# Patient Record
Sex: Male | Born: 1935 | Race: Black or African American | Hispanic: No | Marital: Married | Smoking: Former smoker
Health system: Southern US, Community
[De-identification: ages and names within clinical notes are randomized; demographics above are authoritative.]

## PROBLEM LIST (undated history)

## (undated) DIAGNOSIS — M1990 Unspecified osteoarthritis, unspecified site: Secondary | ICD-10-CM

## (undated) DIAGNOSIS — Z9289 Personal history of other medical treatment: Secondary | ICD-10-CM

## (undated) DIAGNOSIS — G8929 Other chronic pain: Secondary | ICD-10-CM

## (undated) DIAGNOSIS — D490 Neoplasm of unspecified behavior of digestive system: Secondary | ICD-10-CM

## (undated) DIAGNOSIS — M549 Dorsalgia, unspecified: Secondary | ICD-10-CM

## (undated) DIAGNOSIS — R9431 Abnormal electrocardiogram [ECG] [EKG]: Secondary | ICD-10-CM

## (undated) DIAGNOSIS — C61 Malignant neoplasm of prostate: Secondary | ICD-10-CM

## (undated) DIAGNOSIS — I10 Essential (primary) hypertension: Secondary | ICD-10-CM

## (undated) DIAGNOSIS — R51 Headache: Secondary | ICD-10-CM

## (undated) DIAGNOSIS — E785 Hyperlipidemia, unspecified: Secondary | ICD-10-CM

## (undated) DIAGNOSIS — I739 Peripheral vascular disease, unspecified: Secondary | ICD-10-CM

## (undated) DIAGNOSIS — I2510 Atherosclerotic heart disease of native coronary artery without angina pectoris: Secondary | ICD-10-CM

## (undated) DIAGNOSIS — K219 Gastro-esophageal reflux disease without esophagitis: Secondary | ICD-10-CM

## (undated) DIAGNOSIS — R519 Headache, unspecified: Secondary | ICD-10-CM

## (undated) DIAGNOSIS — I219 Acute myocardial infarction, unspecified: Secondary | ICD-10-CM

## (undated) DIAGNOSIS — N390 Urinary tract infection, site not specified: Secondary | ICD-10-CM

## (undated) HISTORY — PX: PROSTATECTOMY: SHX69

## (undated) HISTORY — DX: Hyperlipidemia, unspecified: E78.5

## (undated) HISTORY — DX: Peripheral vascular disease, unspecified: I73.9

## (undated) HISTORY — DX: Malignant neoplasm of prostate: C61

## (undated) HISTORY — DX: Personal history of other medical treatment: Z92.89

## (undated) HISTORY — PX: CATARACT EXTRACTION W/ INTRAOCULAR LENS  IMPLANT, BILATERAL: SHX1307

## (undated) HISTORY — PX: FRACTURE SURGERY: SHX138

## (undated) HISTORY — PX: KNEE DISLOCATION SURGERY: SHX689

## (undated) HISTORY — DX: Essential (primary) hypertension: I10

## (undated) HISTORY — DX: Atherosclerotic heart disease of native coronary artery without angina pectoris: I25.10

---
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
## 1998-02-03 ENCOUNTER — Emergency Department (HOSPITAL_COMMUNITY): Admission: EM | Admit: 1998-02-03 | Discharge: 1998-02-03 | Payer: Self-pay | Admitting: Internal Medicine

## 2001-05-09 ENCOUNTER — Encounter: Payer: Self-pay | Admitting: Internal Medicine

## 2001-05-09 ENCOUNTER — Ambulatory Visit (HOSPITAL_COMMUNITY): Admission: RE | Admit: 2001-05-09 | Discharge: 2001-05-09 | Payer: Self-pay | Admitting: Internal Medicine

## 2001-05-24 ENCOUNTER — Encounter (INDEPENDENT_AMBULATORY_CARE_PROVIDER_SITE_OTHER): Payer: Self-pay | Admitting: *Deleted

## 2001-05-24 ENCOUNTER — Other Ambulatory Visit: Admission: RE | Admit: 2001-05-24 | Discharge: 2001-05-24 | Payer: Self-pay | Admitting: Urology

## 2001-07-08 ENCOUNTER — Inpatient Hospital Stay (HOSPITAL_COMMUNITY): Admission: RE | Admit: 2001-07-08 | Discharge: 2001-07-10 | Payer: Self-pay | Admitting: Urology

## 2001-07-08 ENCOUNTER — Encounter (INDEPENDENT_AMBULATORY_CARE_PROVIDER_SITE_OTHER): Payer: Self-pay

## 2004-02-24 ENCOUNTER — Encounter: Admission: RE | Admit: 2004-02-24 | Discharge: 2004-02-24 | Payer: Self-pay | Admitting: Internal Medicine

## 2004-03-24 ENCOUNTER — Emergency Department (HOSPITAL_COMMUNITY): Admission: EM | Admit: 2004-03-24 | Discharge: 2004-03-25 | Payer: Self-pay | Admitting: Emergency Medicine

## 2004-03-25 ENCOUNTER — Ambulatory Visit (HOSPITAL_COMMUNITY): Admission: RE | Admit: 2004-03-25 | Discharge: 2004-03-25 | Payer: Self-pay | Admitting: Gastroenterology

## 2007-08-15 DIAGNOSIS — I219 Acute myocardial infarction, unspecified: Secondary | ICD-10-CM

## 2007-08-15 HISTORY — DX: Acute myocardial infarction, unspecified: I21.9

## 2007-09-09 ENCOUNTER — Encounter: Admission: RE | Admit: 2007-09-09 | Discharge: 2007-09-09 | Payer: Self-pay | Admitting: Orthopaedic Surgery

## 2008-03-18 ENCOUNTER — Encounter: Admission: RE | Admit: 2008-03-18 | Discharge: 2008-03-18 | Payer: Self-pay | Admitting: Cardiology

## 2008-03-20 HISTORY — PX: CARDIAC CATHETERIZATION: SHX172

## 2008-04-28 ENCOUNTER — Ambulatory Visit: Payer: Self-pay | Admitting: Cardiothoracic Surgery

## 2008-05-01 ENCOUNTER — Ambulatory Visit: Payer: Self-pay | Admitting: Internal Medicine

## 2008-05-01 ENCOUNTER — Encounter: Payer: Self-pay | Admitting: Cardiothoracic Surgery

## 2008-05-01 ENCOUNTER — Ambulatory Visit (HOSPITAL_COMMUNITY): Admission: RE | Admit: 2008-05-01 | Discharge: 2008-05-01 | Payer: Self-pay | Admitting: Cardiothoracic Surgery

## 2008-05-06 ENCOUNTER — Inpatient Hospital Stay (HOSPITAL_COMMUNITY): Admission: RE | Admit: 2008-05-06 | Discharge: 2008-05-11 | Payer: Self-pay | Admitting: Cardiothoracic Surgery

## 2008-05-06 ENCOUNTER — Encounter: Payer: Self-pay | Admitting: Cardiothoracic Surgery

## 2008-05-06 ENCOUNTER — Ambulatory Visit: Payer: Self-pay | Admitting: Cardiothoracic Surgery

## 2008-05-06 HISTORY — PX: CORONARY ARTERY BYPASS GRAFT: SHX141

## 2008-06-05 ENCOUNTER — Ambulatory Visit: Payer: Self-pay | Admitting: Cardiothoracic Surgery

## 2008-06-05 ENCOUNTER — Encounter: Admission: RE | Admit: 2008-06-05 | Discharge: 2008-06-05 | Payer: Self-pay | Admitting: Cardiothoracic Surgery

## 2008-06-11 ENCOUNTER — Encounter (HOSPITAL_COMMUNITY): Admission: RE | Admit: 2008-06-11 | Discharge: 2008-08-12 | Payer: Self-pay | Admitting: Cardiology

## 2008-07-08 DIAGNOSIS — Z9289 Personal history of other medical treatment: Secondary | ICD-10-CM

## 2008-07-08 HISTORY — DX: Personal history of other medical treatment: Z92.89

## 2008-07-28 ENCOUNTER — Ambulatory Visit (HOSPITAL_COMMUNITY): Admission: RE | Admit: 2008-07-28 | Discharge: 2008-07-28 | Payer: Self-pay | Admitting: Cardiology

## 2008-07-28 HISTORY — PX: ABDOMINAL ANGIOGRAM: SHX5705

## 2008-08-14 ENCOUNTER — Encounter (HOSPITAL_COMMUNITY): Admission: RE | Admit: 2008-08-14 | Discharge: 2008-09-17 | Payer: Self-pay | Admitting: Cardiology

## 2008-09-23 ENCOUNTER — Encounter (HOSPITAL_COMMUNITY): Admission: RE | Admit: 2008-09-23 | Discharge: 2008-12-22 | Payer: Self-pay | Admitting: Cardiology

## 2008-10-13 ENCOUNTER — Ambulatory Visit (HOSPITAL_COMMUNITY): Admission: RE | Admit: 2008-10-13 | Discharge: 2008-10-13 | Payer: Self-pay | Admitting: Cardiology

## 2008-10-13 HISTORY — PX: ABDOMINAL ANGIOGRAM: SHX5705

## 2009-10-02 ENCOUNTER — Inpatient Hospital Stay (HOSPITAL_COMMUNITY): Admission: EM | Admit: 2009-10-02 | Discharge: 2009-10-03 | Payer: Self-pay | Admitting: Emergency Medicine

## 2010-07-22 IMAGING — CR DG CHEST 2V
2 series · 2 of 2 positions shown · non-contrast
Comparison: 03/24/2004

CLINICAL DATA: Preoperative respiratory exam.  Chest pain.

CHEST - 2 VIEW

[w chest pa]
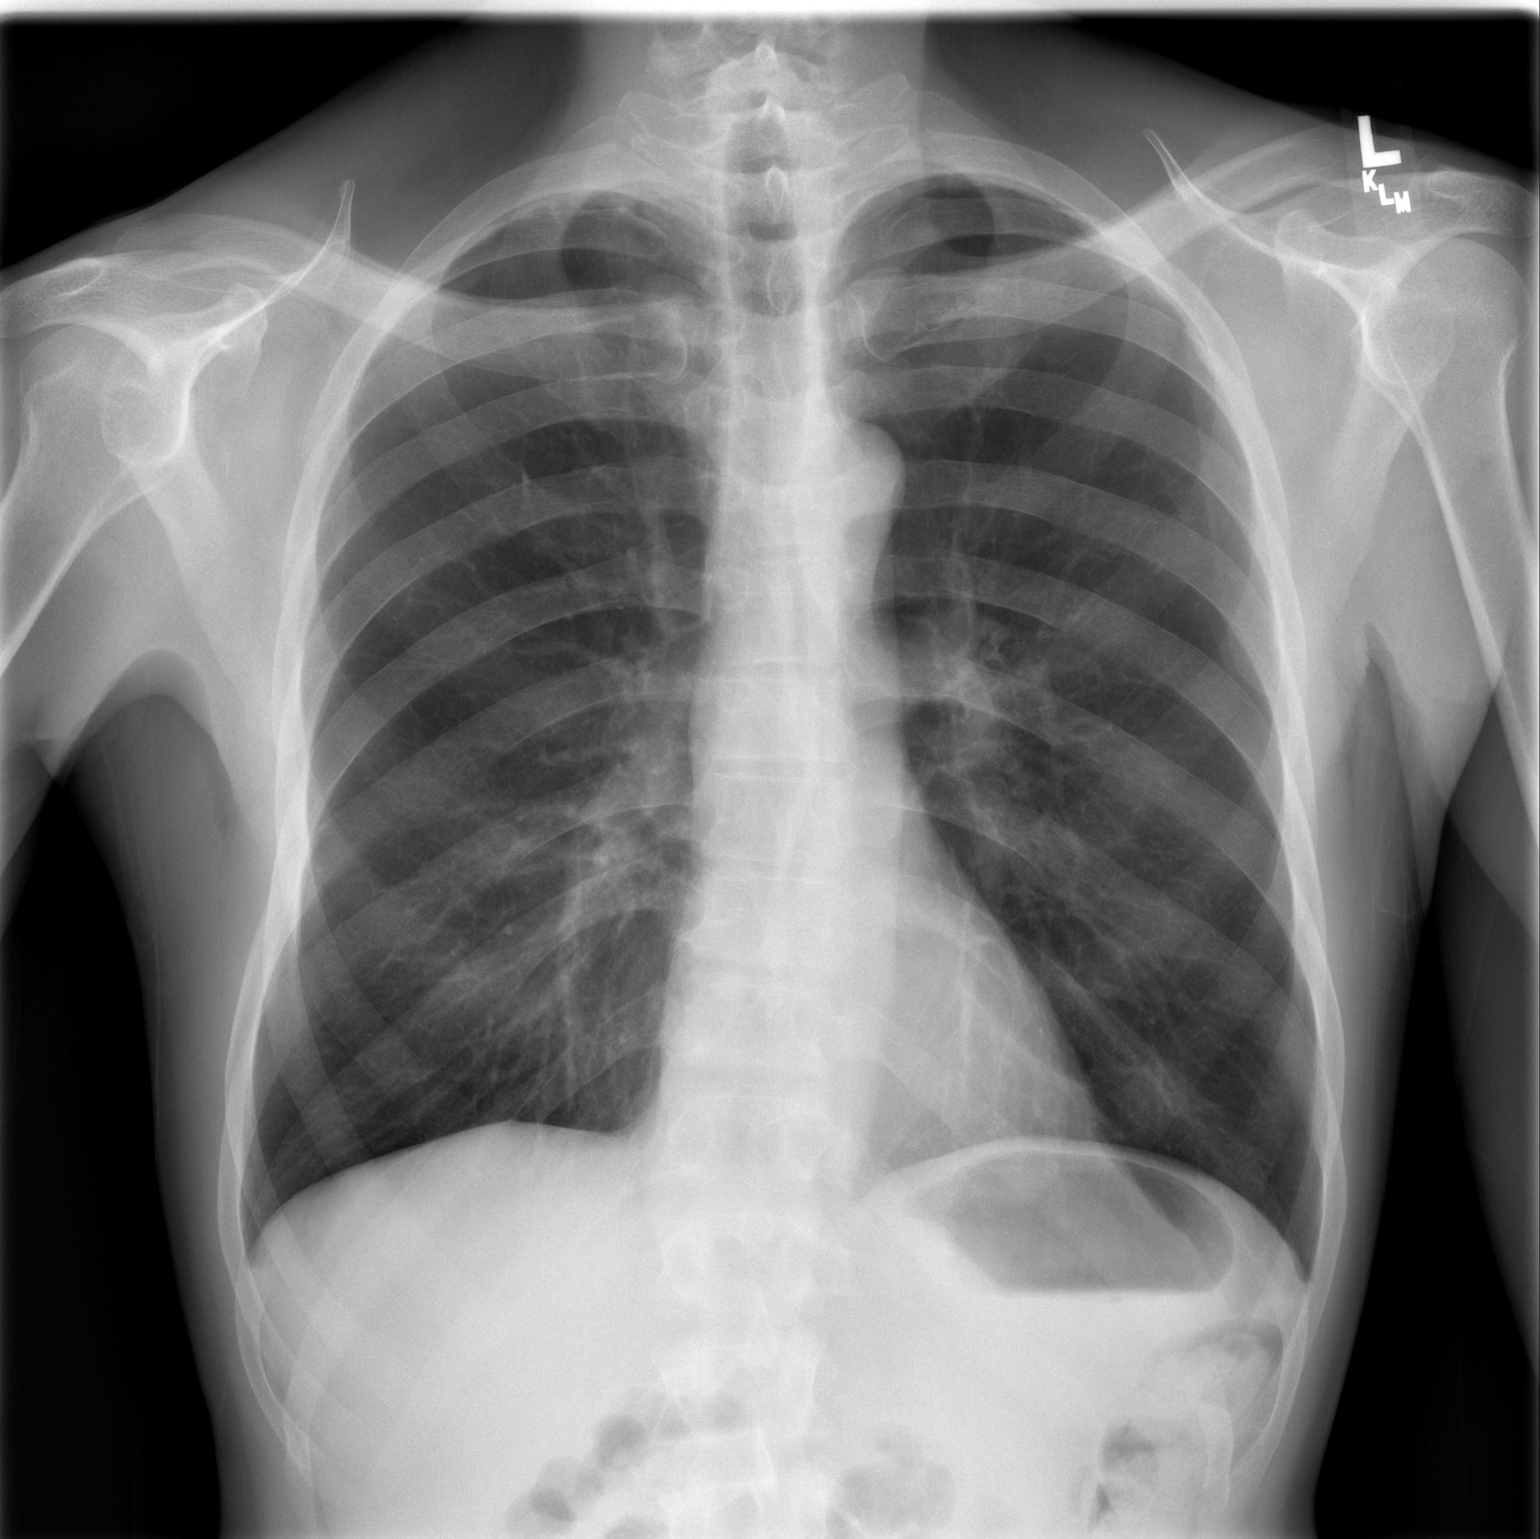

[w chest lat]
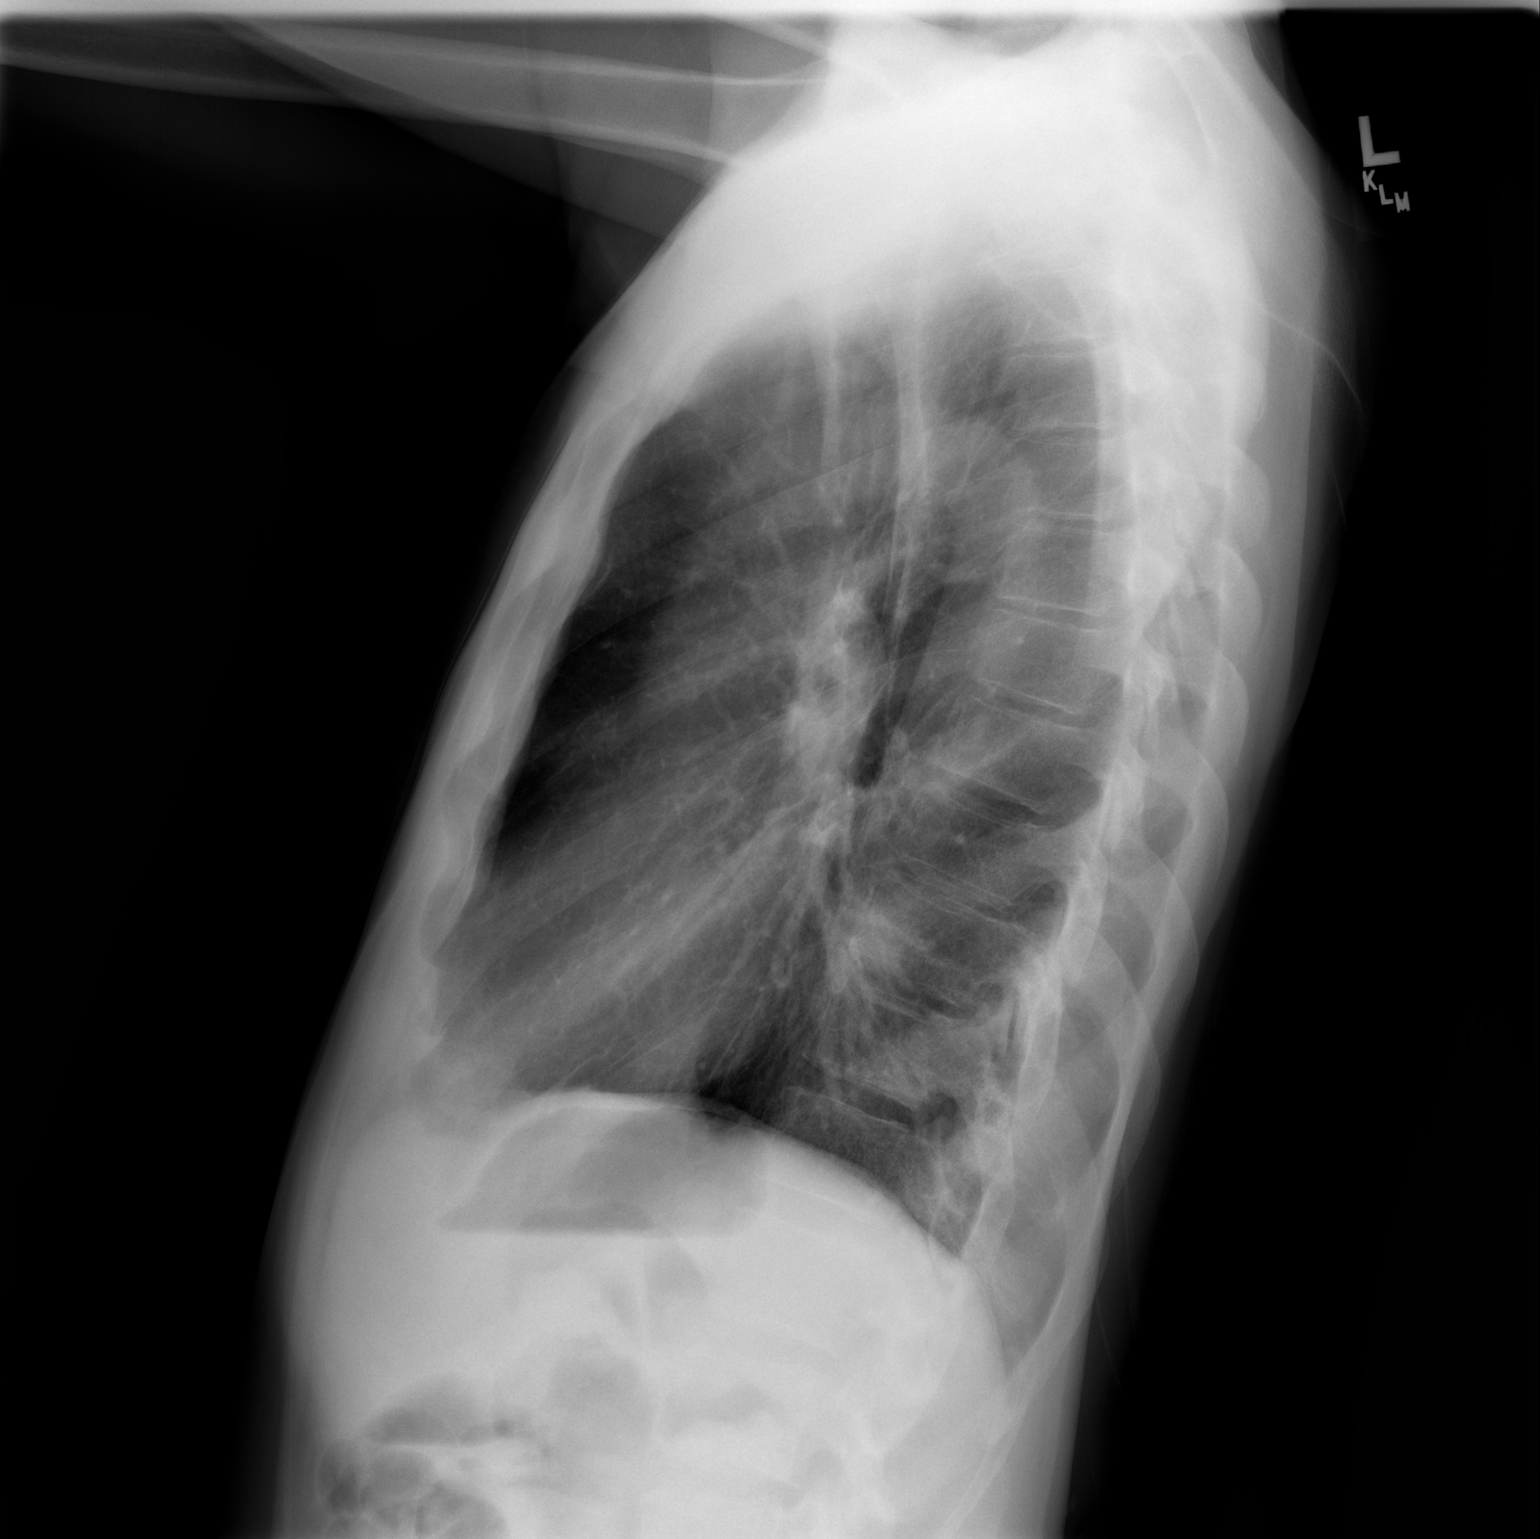

[2 of 2 positions shown; findings below may reference images not displayed]

FINDINGS: Heart size is normal.  The mediastinum is unremarkable.
Lungs are clear.  No effusions.  Bony structures unremarkable.
IMPRESSION: Normal chest

## 2010-11-02 LAB — CK TOTAL AND CKMB (NOT AT ARMC)
CK, MB: 0.9 ng/mL (ref 0.3–4.0)
Total CK: 82 U/L (ref 7–232)

## 2010-11-02 LAB — LIPID PANEL
Cholesterol: 140 mg/dL (ref 0–200)
HDL: 50 mg/dL (ref >39)
Total CHOL/HDL Ratio: 2.8 RATIO
VLDL: 10 mg/dL (ref 0–40)

## 2010-11-02 LAB — CBC
HCT: 41.8 % (ref 39.0–52.0)
HCT: 46.7 % (ref 39.0–52.0)
Hemoglobin: 14.3 g/dL (ref 13.0–17.0)
MCHC: 33.8 g/dL (ref 30.0–36.0)
MCHC: 34.3 g/dL (ref 30.0–36.0)
MCV: 91.4 fL (ref 78.0–100.0)
Platelets: 181 10*3/uL (ref 150–400)
Platelets: 185 10*3/uL (ref 150–400)
RDW: 13.6 % (ref 11.5–15.5)
WBC: 8.6 10*3/uL (ref 4.0–10.5)

## 2010-11-02 LAB — POCT I-STAT, CHEM 8
Calcium, Ion: 1.1 mmol/L — ABNORMAL LOW (ref 1.12–1.32)
Chloride: 103 mEq/L (ref 96–112)
Creatinine, Ser: 1.3 mg/dL (ref 0.4–1.5)
Glucose, Bld: 93 mg/dL (ref 70–99)
Potassium: 4.4 mEq/L (ref 3.5–5.1)

## 2010-11-02 LAB — HEMOGLOBIN A1C
Hgb A1c MFr Bld: 6.6 % — ABNORMAL HIGH (ref 4.6–6.1)
Mean Plasma Glucose: 143 mg/dL

## 2010-11-02 LAB — MAGNESIUM: Magnesium: 2.1 mg/dL (ref 1.5–2.5)

## 2010-11-02 LAB — BASIC METABOLIC PANEL
BUN: 15 mg/dL (ref 6–23)
Calcium: 8.5 mg/dL (ref 8.4–10.5)
GFR calc non Af Amer: 54 mL/min — ABNORMAL LOW (ref >60)
Glucose, Bld: 104 mg/dL — ABNORMAL HIGH (ref 70–99)
Sodium: 135 mEq/L (ref 135–145)

## 2010-11-02 LAB — POCT CARDIAC MARKERS
CKMB, poc: 1 ng/mL (ref 1.0–8.0)
Troponin i, poc: <0.05 ng/mL (ref 0.00–0.09)

## 2010-11-02 LAB — DIFFERENTIAL
Basophils Relative: 0 % (ref 0–1)
Eosinophils Absolute: 0 10*3/uL (ref 0.0–0.7)
Eosinophils Relative: 0 % (ref 0–5)
Lymphs Abs: 1 10*3/uL (ref 0.7–4.0)

## 2010-11-02 LAB — COMPREHENSIVE METABOLIC PANEL
Albumin: 3.4 g/dL — ABNORMAL LOW (ref 3.5–5.2)
BUN: 12 mg/dL (ref 6–23)
Calcium: 8.8 mg/dL (ref 8.4–10.5)
Creatinine, Ser: 1.28 mg/dL (ref 0.4–1.5)
Total Protein: 7.1 g/dL (ref 6.0–8.3)

## 2010-11-02 LAB — CARDIAC PANEL(CRET KIN+CKTOT+MB+TROPI)
Total CK: 85 U/L (ref 7–232)
Troponin I: 0.02 ng/mL (ref 0.00–0.06)

## 2010-11-02 LAB — TROPONIN I: Troponin I: 0.01 ng/mL (ref 0.00–0.06)

## 2010-11-02 LAB — PROTIME-INR: Prothrombin Time: 13.6 seconds (ref 11.6–15.2)

## 2010-11-02 LAB — TSH: TSH: 1.559 u[IU]/mL (ref 0.350–4.500)

## 2010-12-23 ENCOUNTER — Observation Stay (HOSPITAL_COMMUNITY)
Admission: EM | Admit: 2010-12-23 | Discharge: 2010-12-24 | Disposition: A | Discharge status: Home / Self Care | Payer: Medicare Other | Attending: Internal Medicine | Admitting: Internal Medicine

## 2010-12-23 ENCOUNTER — Emergency Department (HOSPITAL_COMMUNITY): Payer: Medicare Other

## 2010-12-23 DIAGNOSIS — Z7902 Long term (current) use of antithrombotics/antiplatelets: Secondary | ICD-10-CM | POA: Insufficient documentation

## 2010-12-23 DIAGNOSIS — E785 Hyperlipidemia, unspecified: Secondary | ICD-10-CM | POA: Insufficient documentation

## 2010-12-23 DIAGNOSIS — I252 Old myocardial infarction: Secondary | ICD-10-CM | POA: Insufficient documentation

## 2010-12-23 DIAGNOSIS — I2510 Atherosclerotic heart disease of native coronary artery without angina pectoris: Secondary | ICD-10-CM | POA: Insufficient documentation

## 2010-12-23 DIAGNOSIS — R000 Tachycardia, unspecified: Secondary | ICD-10-CM | POA: Insufficient documentation

## 2010-12-23 DIAGNOSIS — C61 Malignant neoplasm of prostate: Secondary | ICD-10-CM | POA: Insufficient documentation

## 2010-12-23 DIAGNOSIS — K5900 Constipation, unspecified: Secondary | ICD-10-CM | POA: Insufficient documentation

## 2010-12-23 DIAGNOSIS — K5641 Fecal impaction: Principal | ICD-10-CM | POA: Insufficient documentation

## 2010-12-23 DIAGNOSIS — Z79899 Other long term (current) drug therapy: Secondary | ICD-10-CM | POA: Insufficient documentation

## 2010-12-23 DIAGNOSIS — Z951 Presence of aortocoronary bypass graft: Secondary | ICD-10-CM | POA: Insufficient documentation

## 2010-12-23 DIAGNOSIS — I10 Essential (primary) hypertension: Secondary | ICD-10-CM | POA: Insufficient documentation

## 2010-12-23 DIAGNOSIS — Z7982 Long term (current) use of aspirin: Secondary | ICD-10-CM | POA: Insufficient documentation

## 2010-12-23 DIAGNOSIS — R9431 Abnormal electrocardiogram [ECG] [EKG]: Secondary | ICD-10-CM | POA: Insufficient documentation

## 2010-12-23 LAB — COMPREHENSIVE METABOLIC PANEL
ALT: 14 U/L (ref 0–53)
Albumin: 3.8 g/dL (ref 3.5–5.2)
Alkaline Phosphatase: 85 U/L (ref 39–117)
Calcium: 9.2 mg/dL (ref 8.4–10.5)
Potassium: 4.3 mEq/L (ref 3.5–5.1)
Sodium: 137 mEq/L (ref 135–145)
Total Protein: 7.6 g/dL (ref 6.0–8.3)

## 2010-12-23 LAB — URINALYSIS, ROUTINE W REFLEX MICROSCOPIC
Glucose, UA: NEGATIVE mg/dL
Ketones, ur: 15 mg/dL — AB
Leukocytes, UA: NEGATIVE
Protein, ur: NEGATIVE mg/dL
pH: 6.5 (ref 5.0–8.0)

## 2010-12-23 LAB — POCT I-STAT, CHEM 8
BUN: 13 mg/dL (ref 6–23)
Creatinine, Ser: 1.2 mg/dL (ref 0.4–1.5)
Glucose, Bld: 120 mg/dL — ABNORMAL HIGH (ref 70–99)
HCT: 50 % (ref 39.0–52.0)
Potassium: 4.3 mEq/L (ref 3.5–5.1)
Sodium: 138 mEq/L (ref 135–145)

## 2010-12-23 LAB — DIFFERENTIAL
Basophils Absolute: 0 10*3/uL (ref 0.0–0.1)
Basophils Relative: 0 % (ref 0–1)
Eosinophils Absolute: 0 10*3/uL (ref 0.0–0.7)
Eosinophils Relative: 0 % (ref 0–5)
Monocytes Absolute: 1.6 10*3/uL — ABNORMAL HIGH (ref 0.1–1.0)

## 2010-12-23 LAB — CBC
MCHC: 34.7 g/dL (ref 30.0–36.0)
Platelets: 214 10*3/uL (ref 150–400)
RDW: 13.3 % (ref 11.5–15.5)
WBC: 17.2 10*3/uL — ABNORMAL HIGH (ref 4.0–10.5)

## 2010-12-23 LAB — URINE MICROSCOPIC-ADD ON

## 2010-12-23 LAB — GLUCOSE, CAPILLARY: Glucose-Capillary: 98 mg/dL (ref 70–99)

## 2010-12-23 MED ORDER — IOHEXOL 300 MG/ML  SOLN
100.0000 mL | Freq: Once | INTRAMUSCULAR | Status: AC | PRN
  Administered 2010-12-23: 100 mL via INTRAVENOUS

## 2010-12-24 LAB — CBC
HCT: 40.1 % (ref 39.0–52.0)
MCH: 30.5 pg (ref 26.0–34.0)
MCHC: 34.2 g/dL (ref 30.0–36.0)
MCV: 89.3 fL (ref 78.0–100.0)
Platelets: 180 10*3/uL (ref 150–400)
RDW: 13.5 % (ref 11.5–15.5)
WBC: 14.4 10*3/uL — ABNORMAL HIGH (ref 4.0–10.5)

## 2010-12-24 LAB — CARDIAC PANEL(CRET KIN+CKTOT+MB+TROPI)
CK, MB: 2.6 ng/mL (ref 0.3–4.0)
Relative Index: 1.5 (ref 0.0–2.5)
Total CK: 174 U/L (ref 7–232)
Total CK: 159 U/L (ref 7–232)
Troponin I: <0.3 ng/mL (ref <0.30)
Troponin I: <0.3 ng/mL (ref <0.30)

## 2010-12-24 LAB — CK TOTAL AND CKMB (NOT AT ARMC)
CK, MB: 2.5 ng/mL (ref 0.3–4.0)
Total CK: 197 U/L (ref 7–232)

## 2010-12-24 LAB — COMPREHENSIVE METABOLIC PANEL
Albumin: 2.8 g/dL — ABNORMAL LOW (ref 3.5–5.2)
Alkaline Phosphatase: 67 U/L (ref 39–117)
BUN: 10 mg/dL (ref 6–23)
Calcium: 8.5 mg/dL (ref 8.4–10.5)
Creatinine, Ser: 0.94 mg/dL (ref 0.4–1.5)
Glucose, Bld: 90 mg/dL (ref 70–99)
Total Protein: 6 g/dL (ref 6.0–8.3)

## 2010-12-27 NOTE — Cardiovascular Report (Signed)
NAME:  Tony Curtis, Tony Curtis                ACCOUNT NO.:  [id number]   MEDICAL RECORD NO.:  [id number]          PATIENT TYPE:  AMB   LOCATION:  SDS                          FACILITY:  [facility]   PHYSICIAN:  Vonna Kotyk R. Jacinto Halim, MD       DATE OF BIRTH:  02/10/1936   DATE OF PROCEDURE:  07/28/2008  DATE OF DISCHARGE:                            CARDIAC CATHETERIZATION   PROCEDURES PERFORMED:  1. Abdominal aortogram.  2. Abdominal aortogram with bifemoral runoff.  3. Crossover from the left femoral artery into the right femoral      artery.  4. Right femoral arteriogram.  5. Percutaneous transluminal angioplasty and balloon angioplasty with      scoring balloon angioplasty with angioscope of the right distal      superficial femoral artery and right popliteal artery.   INDICATIONS:  [name] is a 75 year old gentleman with known  coronary artery disease status post CABG who was been complaining of  severe lifestyle-limiting claudication of his bilateral lower  extremities, especially the calf.  He had undergone lower extremity  arterial Dopplers, which were abnormal revealing high-grade stenosis of  the distal SFA and popliteal artery bilaterally.  His ABIs were in the  0.6 range on the right.  Given his symptomatic claudication, he was  brought to the peripheral angiography suite for evaluation of his  peripheral anatomy.   Abdominal aortogram revealed presence of 2 renal arteries one on either  side spread as mild tortuosity of abdominal aorta.  There was acute  angulation of the aortoiliac bifurcation, but widely patent.   The aortoiliac bifurcation was widely patent.  The iliac arteries are  widely patent bilaterally.   Right lower extremity.  Right lower extremity revealed the distal right  SFA to have a 99% stenoses.  The right popliteal artery right at the  knee joint also had a high-grade 95-99% stenoses.   Below the right knee, there was initially 3-vessel runoff followed by  complete occlusion of the anterior tibial artery at just above the  ankle, however, this artery reconstitutes by collaterals.   Left lower extremity.  The left SFA also similarly shows a high-grade  90% left distal SFA stenosis followed by a popliteal 90% stenoses.   Below the left knee, the posterior tibial artery is occluded above the  ankle, however, this again reconstitutes by means of collaterals at the  level of the foot.  Overall, there was 2-vessel runoff bilaterally with  reconstitution.   Overall, there was 2-vessel runoff bilaterally below the knee with  reconstitution of the anterior tibial artery on the right and posterior  tibial artery on the left.   INTERVENTION DATA:  Successful PTA and scoring balloon angioplasty of  the right distal SFA and right popliteal artery with reduction of  stenosis from 99% to 0%.  A 5.0- x 20-mm angioscope balloon was utilized  with excellent results.  There was minimal antegrade dissection plane  noted in the distal SFA, however, this was not flow limiting.   RECOMMENDATIONS:  The patient will be discharged home today.  Given high-  grade  stenosis of the left distal SFA and left popliteal artery, he  probably will benefit from revascularization of the same before complete  occlusion occurs.  He also is symptomatic, however, if his symptoms do  resolve with medical therapy then continued medical therapy with  observation is indicated.  Otherwise, we will bring him back on a an  elective basis for angioplasty of the left lower extremity.  The patient  will follow up with Dr. Ritta Slot in about 10 days to 2-3 weeks for  clinical evaluation.   A total of 110 mL of contrast was utilized for diagnostic and  interventional procedure.   TECHNIQUE OF THE PROCEDURE:  Under usual sterile precautions using a 5-  French left femoral artery access, a 5-French Omni flush catheter was  advanced into the abdominal aorta and abdominal aortogram  was performed.  Abdominal aortogram with bifemoral runoff was also performed.   Using heparin for anticoagulation and using the same catheter, I was  able to crossover from the left into the right lower extremity.  Then,  using a Wholey wire, I was able to cross and place crossover Terumo 7  French sheath into the right common femoral artery.  Right femoral  arteriogram was performed.  Then, using a stabilizer 0.014th of an inch  guidewire, I was able to cross the lesion with mild amount of  difficulty.  Then, using angioscope balloon, multiple balloon  angioplasties were performed anywhere from 6-10 atmospheric pressure  throughout the entire lesion length of the distal SFA and popliteal  artery.  Having performed this, 400 mcg of intra-arterial nitroglycerin  was administered and angiography was performed.  Excellent results were  noted.  The wire and the balloon was withdrawn.  Then, the Terumo sheath  was gently pulled back into the left common femoral artery.  The patient  tolerated the procedure.  There was no immediate complication noted.      Cristy Hilts. Jacinto Halim, MD  Electronically Signed     JRG/MEDQ  D:  07/28/2008  T:  07/28/2008  Job:  [id number]   cc:   Ritta Slot, MD  Merlene Laughter. [name], M.D.

## 2010-12-27 NOTE — Op Note (Signed)
NAME:  Tony Curtis, Tony Curtis NO.:  [id number]   MEDICAL RECORD NO.:  [id number]          PATIENT TYPE:  INP   LOCATION:  2304                         FACILITY:  [facility]   PHYSICIAN:  Kerin Perna, M.D.  DATE OF BIRTH:  [birth year]   DATE OF PROCEDURE:  05/06/2008  DATE OF DISCHARGE:                               OPERATIVE REPORT   OPERATION:  1. Coronary artery bypass grafting x3 (left internal mammary artery to      LAD, saphenous vein graft to diagonal, saphenous vein graft to the      ramus intermediate).  2. Endoscopic harvest of bilateral thigh, greater saphenous vein.   SURGEON:  Kerin Perna, MD   ASSISTANTS:  1. Salvatore Decent Dorris Fetch, MD  2. Coral Ceo, PA.   PREOPERATIVE DIAGNOSIS:  Severe 2-vessel coronary artery disease with  class III anginal equivalent of shortness of breath.   POSTOPERATIVE DIAGNOSIS:  Severe 2-vessel coronary artery disease with  class III anginal equivalent of shortness of breath.   ANESTHESIA:  General.   INDICATIONS:  The patient is a 75 year old ex-smoker, who presents with  exertional dyspnea and a positive stress test.  Dr. Donavan Burnet cardiac  cath showed a mild to moderate left main stenosis with total occlusion  of the LAD with reconstitution via collaterals and high-grade stenosis  of the proximal ramus intermediate.  He was felt to be a candidate for  multivessel coronary bypass surgery.  I examined the patient in the  office and reviewed the results of cardiac cath with the patient and his  family.  I discussed the indications and benefits of coronary artery  bypass surgery for treatment of coronary artery disease.  I reviewed the  alternatives to surgical therapy as well.  I discussed with him the  major issues of surgery including the location of the surgical incision,  use of general anesthesia, and cardiopulmonary bypass, the choice of  conduit to include internal mammary artery and endoscopically harvested  saphenous vein, and the expected hospital recovery.  I also reviewed  with him the risks to him of coronary artery bypass surgery including  the risks of MI, CVA, bleeding, infection, blood transfusion  requirement, and death.  After reviewing these issues, he demonstrated  the understanding and agreed to proceed with surgery under what I felt  was an informed consent.   OPERATIVE FINDINGS:  The saphenous vein was small bilaterally, and the  total amount of vein was harvested from both thighs leaving no usable  vein in the leg.  The LAD was chronically occluded about an adequate  target.  The ramus intermediate was intramyocardial about an adequate  target.  The mammary artery had adequate flow, which improved after  papaverine administration.  The patient has significant emphysema.  The  circumflex had no intrinsic disease and was a vessel limited mainly to  the AV groove with very small branches across the ventricular wall and  was not grafted.  The patient did not receive any blood transfusion  during the surgery.   PROCEDURE:  The patient was brought to the operating  room and placed  supine on the operating table.  General anesthesia was induced.  A  transesophageal 2-D echo probe was placed by the anesthesiologist, which  showed mild to moderate reduction in global LV function, but no  significant valvular insufficiency.  The chest, abdomen, and legs were  prepped with Betadine and draped as a sterile field.  A sternal incision  was made.  The saphenous vein was harvested endoscopically from both  legs.  The left internal mammary artery was harvested as a pedicle graft  from its origin at the subclavian vessels.  Heparin was administered and  the ACT was documented as being therapeutic.  The sternal retractor was  placed and the pericardium was opened and suspended.  Pursestring was  placed in the ascending aorta and right atrium and the patient was  cannulated for bypass.  After  the vein had been harvested and inspected  and found to be adequate, the patient was then placed on bypass with  mild to moderate hypothermia.  The coronaries were identified and  dissected for grafting.  Cardioplegic catheters were placed for both  antegrade and retrograde cold blood cardioplegia and the mammary artery  and vein grafts were prepared for the distal anastomoses.  The aortic  crossclamp was applied and 800 mL of cold blood cardioplegia was  delivered with good cardioplegic arrest and septal temperature dropped  to less than 15 degrees.  Cardioplegia was then delivered every 15-20  minutes while the crossclamp was applied.   The distal coronary anastomoses were performed.  The first distal  anastomosis was to the ramus branch of the left coronary.  This was a  1.6-mm vessel with proximal 80% stenosis.  Reverse saphenous vein was  sewn end-to-side with running 7-0 Prolene with good flow through the  graft.  The best portion of the vein was used for this distal  anastomosis.  The second distal anastomosis was to the diagonal branch  of LAD, which was 1.5-mm vessel and a proximal total occlusion.  A  reverse saphenous vein was smaller caliber was sewn end to side with  running 7-0 Prolene with good flow through the graft.  Cardioplegia was  redosed.  The third anastomosis was to the mid LAD, which was  intramyocardial and was a 1.75-mm vessel, the total proximal occlusion.  Left IMA pedicle was brought through an opening and the left lateral  pericardium was brought down onto the LAD and sewn end to side with a  running 8-0 Prolene.  There was good flow through the anastomosis with  warming of the septum after briefly releasing the pedicle bulldog on the  mammary artery.  The bulldog was reapplied and the pedicle was secured  with the epicardium.  Cardioplegia was redosed.   While the crossclamp was still in place, 2 proximal vein anastomoses  were placed on the ascending  aorta using a 4.0-mm punch running 7-0  Prolene.  Prior to tying down the final proximal anastomosis, air was  vented from the coronaries with a dose of retrograde warm blood  cardioplegia and the final proximal anastomosis was tied.  The  crossclamp was then removed and his heart resumed a spontaneous rhythm.  Cardioplegia catheter was removed and air was aspirated from the vein  grafts with a 27-gauge needle.  The grafts were opened and each had good  flow.  Hemostasis was documented at the proximal and distal sites.  The  patient was rewarmed and reperfused.  Temporary pacing wires were  applied.  Lungs re-expanded and the ventilator was resumed.  When the  patient reached 37 degrees, he was weaned from bypass without  difficulty.  TEE showed preservation of LV function.  Protamine was  administered without adverse reaction.  The cannula was removed.  Mediastinum was irrigated with warm irrigation.  The superior  pericardial fat was closed over the aorta.  Two mediastinal and a left  pleural chest tube were placed  brought out through separate incisions.  The sternum was closed with  interrupted steel wire.  The pectoralis fascia and the subcutaneous  layers were closed with running Vicryl.  The skin was closed with a  subcuticular and sterile dressing was applied.  Total bypass time was  108 minutes and the patient returned to the ICU in stable condition.      Kerin Perna, M.D.  Electronically Signed     PV/MEDQ  D:  05/06/2008  T:  05/07/2008  Job:  [id number]   cc:   Ritta Slot, MD  [facility]

## 2010-12-27 NOTE — Consult Note (Signed)
NEW PATIENT CONSULTATION   RENA, SWEEDEN  DOB:  1936-05-07                                        April 28, 2008  CHART #:  [id number]   PRIMARY CARE PHYSICIAN:  Merlene Laughter. [name], MD   REASON FOR CONSULTATION:  Left main and severe two-vessel coronary  artery disease.   CHIEF COMPLAINT:  Shortness of breath with exertion.   HISTORY OF PRESENT ILLNESS:  I was asked to evaluate this very nice 75-  year-old [place] American male for potential surgical coronary  revascularization for recently diagnosed severe multivessel coronary  artery disease with a 50% left main stenosis.  The patient apparently  suffered asymptomatic MI years ago.  He has been recently evaluated by  Dr. Lynnea Ferrier for progressive dyspnea on exertion and exercise  intolerance.  Non-invasive tests included a 2-D echo, which showed  reduced LV function with EF of 40%.  There was mild mitral  regurgitation.  The stress test with myocardial perfusion showed  evidence of an anterior MI with some hypoperfusion patterns in the  apical and inferior septal walls.  EF by Cardiolite was 44%.  The  patient subsequently underwent diagnostic left heart cath, which  demonstrated LVEDP of 5 with EF of 35%-40%.  There is no significant  valvular gradients.  The left main had a 50% stenosis.  The LAD was  occluded and filled via collaterals from right.  The ramus intermedius  had a 70% stenosis and the right coronary had no significant disease.  Based on his symptoms, reduced LV function and multivessel disease,  surgical evaluation was felt to be indicated.   PAST MEDICAL HISTORY:  1. Hypertension.  2. Hyperlipidemia.  3. History of prostate cancer treated with prostatectomy in 2002, now      on remission.  4. Right knee surgery for dislocation at age 57.   CURRENT MEDICATIONS:  Zocor 40 mg daily, aspirin 81 mg daily, Toprol-XL  50 mg b.i.d., Imdur 30 mg daily, and ramipril 5 mg daily.   ALLERGIES:  None.   SOCIAL HISTORY:  The patient is retired and worked in Production designer, theatre/television/film.  He  is married and does not have a history of smoking or alcohol intake.   FAMILY HISTORY:  Positive for diabetes and coronary artery disease.   REVIEW OF SYSTEMS:  Constitutional:  Negative for fever or weight loss.  ENT:  Negative for active dental problems or difficulty swallowing.  Thoracic:  Negative for history of abnormal chest x-ray or prior  thoracic trauma.  Cardiac:  Positive for his class III symptoms of  angina with severe coronary artery disease and reduced LV function.  He  has mild MR by outpatient echo.  GI:  Positive for GERD.  Negative for  hepatitis, jaundice, or blood per rectum.  Urologic:  Positive for his  prostatectomy and no residual outflow obstruction symptoms.  Musculoskeletal:  Positive for chronic right leg pain after knee injury  requiring extensive surgery.  Endocrine:  Negative for diabetes.  Vascular:  Negative for DVT or TIA.  Neurologic:  Negative for stroke or  seizure.   PHYSICAL EXAMINATION:  VITAL SIGNS:  The patient is 5 feet and 10  inches, weighs 140 pounds, blood pressure 110/70, pulse 64 and regular,  respirations 18, and saturation 97% on room air.  GENERAL APPEARANCE:  A pleasant  middle-aged male accompanied by his  wife, in no acute distress.  HEENT:  Normocephalic.  Dentition adequate.  NECK:  Without JVD, mass, or bruit.  LYMPHATICS:  No palpable cervical or supraclavicular adenopathy.  LUNGS:  Breath sounds are clear.  CHEST:  There is no thoracic deformity.  CARDIAC:  Regular rhythm without S3, gallop, or murmur.  ABDOMEN:  Soft without pulsatile mass or focal tenderness.  EXTREMITIES:  No evidence of clubbing, cyanosis, or edema.  Peripheral  pulses are 2+ in the femoral and radial areas, nonpalpable in the pedal  areas.  SKIN:  Without rash or lesion.  NEUROLOGIC:  Nonfocal.   LABORATORY DATA:  I reviewed the coronary arteriograms  performed earlier  this summer, which reveal severe multivessel coronary artery disease  with the reduced LV function.  I doubt the mitral regurgitation is  significant.   IMPRESSION AND PLAN:  The patient will be prepared for multivessel  bypass surgery with targets, implant to the LAD, ramus, and circumflex  marginal.  Surgery will be scheduled for May 06, 2008, with a  preop visit on May 04, 2008.  I discussed the indications,  benefits, alternatives, and expected recovery of the surgery in detail  with the patient and his wife.  I reviewed the potential risks including  the risks of bleeding, blood transfusion, stroke, MI, infection, and  death.  He understands these issues and agrees to proceed with surgery  to be scheduled in 1 week.   Kerin Perna, M.D.  Electronically Signed   PV/MEDQ  D:  04/28/2008  T:  04/29/2008  Job:  [id number]   cc:   Ritta Slot, MD  Merlene Laughter. [name], M.D.

## 2010-12-27 NOTE — Cardiovascular Report (Signed)
NAME:  Tony Curtis, Tony Curtis                ACCOUNT NO.:  [id number]   MEDICAL RECORD NO.:  [id number]          PATIENT TYPE:  AMB   LOCATION:  SDS                          FACILITY:  [facility]   PHYSICIAN:  Cristy Hilts. Jacinto Halim, MD       DATE OF BIRTH:  1936/06/10   DATE OF PROCEDURE:  10/13/2008  DATE OF DISCHARGE:  10/13/2008                            CARDIAC CATHETERIZATION   PROCEDURE PERFORMED:  1. Right femoral access and crossover into the left iliac artery and      left femoral arteriogram with distal runoff.  2. Percutaneous transluminal angioplasty and scoring balloon      angioplasty of the left distal superficial femoral artery and left      popliteal artery.   INDICATION:  Tony Curtis is a 75 year old gentleman with ischemic  cardiomyopathy, hypertension, and hyperlipidemia.  He has ejection  fraction of 35-45%.  He has peripheral vascular disease with symptomatic  claudication of bilateral lower extremities.  He had undergone  AngioSculpt balloon angioplasty of the right distal SFA and right  popliteal artery in December 2009.  He still had high-grade stenosis of  left distal SFA and left popliteal artery but was treated medically, but  because of continued symptoms of claudication, he is now referred to me  for possible angiography and angioplasty of the same, of the left distal  SFA and popliteal artery.   ANGIOGRAPHIC DATA:  The left iliac artery and left common femoral artery  were widely patent with mild luminal irregularity.   The left superficial femoral artery had mild disease.  The distal left  SFA had a 90-95% high-grade focal stenosis.  The left popliteal artery  at the level of the knee joint also had a high-grade 95% stenosis.   Below the knee on the left side, there was 3-vessel runoff albeit the  flow was slow in the anterior tibial artery.   INTERVENTION DATA:  Successful PTA and angioscoring with a 5.0 x 20 mm  AngioSculpt balloon.  Multiple inflations were  performed anywhere from 5  atmospheric pressure to 10 atmospheric pressure anywhere from 60-90  seconds.  Post angioplasty angiography revealed less than 10% residual  stenosis without any evidence of dissection or thrombus.   RECOMMENDATIONS:  I expect significant improvement in his lower  extremity claudication.  He will need lower arterial Dopplers for  surveillance of both the lower extremities.  He will be continued on  aggressive risk modification.   A total of 101 mL of contrast was utilized for diagnostic and  interventional procedure.   TECHNIQUE OF THE PROCEDURE:  Under usual sterile precautions, using a 5-  French right femoral artery access,  a 5-French crossover catheter was  utilized to crossover into the left iliac artery.  With great amount of  difficulty because of cute angle takeoff and bifurcation of internal and  external iliac artery, also was acute angle, I used a 0.03/5th of an  inch guidewire, and I was able to gain access into the left common  femoral artery.  I used a end-hole catheter to perform left iliac  femoral arteriogram with distal runoff.  Having performed this using  4000 units of heparin, keeping ACT greater than 200, and using Versacore  0.03/5th of an inch wire, I was able to place a 7-French crossover  sheath into the left common femoral artery.  Rest of the procedure was  done over a 0.01/4th of an inch [employer] Prowater guidewire and using  AngioSculpt 5.0 x 20 mm balloon, multiple scoring was performed within  the popliteal and superficial femoral artery.  Having performed this,  500 mcg of intra-arterial nitroglycerin  was administered, and angiography was performed.  Excellent results were  obtained.  The wire was withdrawn, and the crossover sheath was gently  pulled into the right common femoral artery.  The patient tolerated the  procedure.  No immediate complications.      Cristy Hilts. Jacinto Halim, MD  Electronically Signed     Cristy Hilts. Jacinto Halim,  MD  Electronically Signed    JRG/MEDQ  D:  10/13/2008  T:  10/13/2008  Job:  [id number]   cc:   Ritta Slot, MD  Merlene Laughter. [name], M.D.

## 2010-12-27 NOTE — Assessment & Plan Note (Signed)
OFFICE VISIT   Tony Curtis, Tony Curtis  DOB:  21-Aug-1935                                        June 05, 2008  CHART #:  [id number]   HISTORY OF PRESENTING ILLNESS:  This is a pleasant 75 year old African  American male with a past medical history of hypertension,  hyperlipidemia, and prostate cancer (status post prostatectomy) who was  found to have severe multivessel coronary artery disease.  He underwent  coronary artery bypass grafting surgery x3 (LIMA to LAD, SVG to  diagonal, and SVG to ramus intermedius) with EVH of bilateral thighs by  Dr. Donata Clay on 05/06/2008.  He had a fairly uneventful postoperative  course.  He was discharged from [facility] on 05/10/2008.  Since that  time, the patient's only complaint included some shortness of breath  that occurs both upon rest and exertion (similar to what he had  experienced preoperatively) and occasional posterior back pain.  He  denies any abdominal pain, nauseousness, vomiting, fever, or chills.   PHYSICAL EXAMINATION:  GENERAL:  This is a pleasant 75 year old [place]  American male who is in no acute distress who is alert, oriented, and  cooperative.  VITAL SIGNS:  BP 120/79, heart rate 80, respirations 80, and O2 sat 98%  on room air.  CARDIOVASCULAR:  Regular rate and rhythm.  No murmurs, gallops, or rubs.  PULMONARY:  Clear to auscultation bilaterally.  No rales, wheezes, or  rhonchi.  EXTREMITIES:  No cyanosis, clubbing, or edema.  WOUNDS:  Sternum is solid.  Incision is clean, dry, and well healed.  Bilateral lower extremity wounds clean and dry.   Chest x-ray done today shows a mildly torturous atherosclerotic thoracic  aorta, interval resolution of previously seen bilateral pleural  effusions, bilateral lower lobe atelectasis, gas bubble in the stomach,  and lungs are clear.   IMPRESSION AND PLAN:  1. Status post coronary artery bypass grafting surgery x3 on      05/06/2008.  2. History of  hypertension.  3. History of hyperlipidemia.  4. History of prostate cancer.   MEDICATIONS:  Reviewed with the patient.  He is to continue on his Zocor  40  mg p.o. nightly, ECASA 8 mg p.o. daily, Lopressor 25 mg p.o. twice  daily, multivitamin p.o. daily, and Caltrate.  In addition, the patient  had taken ranitidine and Zyrtec p.r.n. preoperatively.  He was  instructed he may also take them on a p.r.n. basis.   We was also advised he may begin outpatient cardiac rehabilitation.  He  is not to lift more than 10-15 pounds until after Christmas.  He may  drive short distances.  A followup appointment has been made for him to  see Dr. Lynnea Ferrier on Monday 06/08/2008.  He will return to see Dr. Donata Clay on a p.r.n. basis.  He was instructed to call for any further  problems, questions, or concerns in the interim.   Kerin Perna, M.D.  Electronically Signed   DZ/MEDQ  D:  06/05/2008  T:  06/05/2008  Job:  [id number]   cc:   Ritta Slot, MD

## 2010-12-27 NOTE — Discharge Summary (Signed)
NAME:  Tony Curtis, Tony Curtis NO.:  [id number]   MEDICAL RECORD NO.:  [id number]          PATIENT TYPE:  INP   LOCATION:  2007                         FACILITY:  [facility]   PHYSICIAN:  Kerin Perna, M.D.  DATE OF BIRTH:  [birth year]   DATE OF ADMISSION:  05/06/2008  DATE OF DISCHARGE:  05/10/2008                               DISCHARGE SUMMARY   PRIMARY ADMITTING DIAGNOSIS:  Shortness of breath.   ADDITIONAL/DISCHARGE DIAGNOSES:  1. Severe 2-vessel coronary artery disease with left main disease.  2. Hypertension.  3. Hyperlipidemia.  4. History of prostate cancer status post prostatectomy.   PROCEDURES PERFORMED:  1. Coronary artery bypass grafting x3 (left internal mammary artery to      the LAD, saphenous vein graft to the diagonal, saphenous vein graft      to the ramus intermedius).  2. Endoscopic vein harvest, bilateral thighs.   HISTORY:  The patient is a 75 year old male who has a history of an MI  many years ago.  He has recently developed progressive dyspnea on  exertion and decreased exercise tolerance.  He was seen by Dr. Lynnea Ferrier  and underwent a 2-D echocardiogram, which showed an EF of 40% with mild  MR.  He also underwent a stress test with myocardial perfusion study and  this showed evidence of a previous anterior MI with hypoperfusion in the  apex and inferior septal wall.  EF by Cardiolite was 44%.  He  subsequently underwent diagnostic left heart catheterization which  showed an EF of 35-40% with a 50% left main stenosis and severe 2-vessel  coronary artery disease.  Because of his symptoms and his multivessel  disease, he was felt to be a poor candidate for percutaneous  intervention.  He was seen as an outpatient consultation by Dr. Kathlee Nations Trigt for consideration of surgical revascularization.  Dr. Donata Clay reviewed his films and agreed that he would best be served by CABG  at this time.  He explained all the risks, benefits, and  alternatives of  the procedure to the patient and he agreed to proceed with surgery.   HOSPITAL COURSE:  He was admitted to [facility] on May 06, 2008 and underwent CABG x3 as described in detail above performed by  Dr. Donata Clay.  He tolerated the procedure well and was transferred to  the SICU in stable condition.  He was able to be extubated shortly after  surgery.  He was hemodynamically stable and doing well on postop day #1.  At that time, his hemodynamic monitoring devices and chest tubes were  removed.  He remained in the unit for an additional 24 hours  observation.  By postop day #2, he was ready for transfer to the floor.  He has been somewhat hypotensive postoperatively, rating blood pressures  in the 90 to low 100 systolic.  He has been started on a low-dose beta-  blocker, but because his blood pressure has been on the low side he has  not been started on his home dose of ACE inhibitor.  Also, he has  been  mildly volume overloaded and has been started on Lasix to which he is  responding well.  He remains approximately 2 kg above his preoperative  weight with minimal lower extremity edema on physical exam.  He has been  ambulating in the halls without difficulty.  He has been tolerating a  regular diet and is having normal bowel and bladder function.  His  incisions are all healing well.  He has remained afebrile and his vital  signs have been stable.  His most recent labs show a hemoglobin of 10.4,  hematocrit 31, platelets 732, and white count 9.2.  Sodium 140,  potassium 4.1, BUN 14, creatinine and 1.11.  It is felt that if he  continues to remain stable, he may be able to be discharged home later  today postop day #4, May 10, 2008.   DISCHARGE MEDICATIONS:  1. Enteric-coated aspirin 81 mg daily.  2. Toprol-XL 25 mg daily.  3. Lasix 40 mg daily x3 days.  4. Potassium 20 mEq daily x3 days.  5. Tylox 1-2 q.4 h. p.r.n. pain.  6. Simvastatin 40 mg  q.p.m.  7. Methocarbamol 500 mg q.6 h.  8. Zyrtec 10 mg daily.  9. One A Day vitamin daily.  10.Ranitidine 150 mg p.r.n.   DISCHARGE INSTRUCTIONS:  He is asked to refrain from driving, heavy  lifting, or strenuous activity.  He may continue ambulating daily and  using his incentive spirometer.  He may shower daily and clean his  incisions with soap and water.  He will continue low-fat, low-sodium  diet.   DISCHARGE FOLLOWUP:  He is asked to make an appointment see Dr. Lynnea Ferrier  in 2 weeks.  He will be contacted by the [facility] office to see Dr. Donata Clay in 3 weeks with a chest x-ray.  In the interim, he is asked to  call if he experiences any problems or has questions.      Coral Ceo, P.A.      Kerin Perna, M.D.  Electronically Signed    GC/MEDQ  D:  05/10/2008  T:  05/10/2008  Job:  [id number]   cc:   Ritta Slot, MD  Merlene Laughter. [name], M.D.

## 2010-12-30 NOTE — H&P (Signed)
[facility]  Patient:    Tony Curtis, Tony Curtis Visit Number: [id number] MRN: [id number]          Service Type: SUR Location: 3W 0343 02 Attending Physician:  [name] Dictated by:   Tony Curtis, M.D. Admit Date:  07/08/2001                           History and Physical  HISTORY OF PRESENT ILLNESS:  The patient is a very pleasant, 75 year old white male, patient of Dr. Lendell Caprice, who is seen in office consultation for an elevated PSA of 9.5 with a ratio of 8.6%.  He had had a previously mildly elevated PSA of 5.6 with ratio of 14.6 in March 2001.  He had failed to return for followup, but denied any bone pain, other than some back pain from an injury years ago.  He reported some erectile dysfunction that has responded nicely to Viagra.  I found at that time that he had a slightly firm area in the right side of his prostate and noted no lytic lesions by KUB.  Dr. Lendell Caprice obtained a bone scan that was negative because of his back pain, and I proceeded with transrectal ultrasound and biopsy of his prostate that revealed no evidence of extracapsular extension, and the biopsy proved to show adenocarcinoma of prostate.  He is now admitted for elective radical retropubic prostatectomy.  PAST MEDICAL HISTORY: 1. Hypertension, but he is not on any medical management for that. 2. Seasonal allergies.  PAST SURGICAL HISTORY:  None.  ALLERGIES:  No known drug allergies.  CURRENT MEDICATIONS:  Allegra as needed, 60 mg.  SOCIAL HISTORY:  He denies tobacco or ethanol use.  FAMILY HISTORY:  Father died of emphysema at 45 and mother died of unknown causes at 66.  He reports prostate cancer in his family.  REVIEW OF SYSTEMS:  Per health history section II which was reviewed and signed.  PHYSICAL EXAMINATION:  VITAL SIGNS:  Blood pressure is 136/88, pulse 84 and regular, respirations 16, and temperature 97.0.  GENERAL:  The patient is a  well-developed, well-nourished, thin, black male in no apparent distress.  HEENT:  Atraumatic, normocephalic.  He has slightly poor dentition, but no oral lesions are noted.  NECK:  Supple without JVD or adenopathy.  HEART:  Regular rate and rhythm.  CHEST:  Clear to auscultation.  ABDOMEN:  Soft, nontender without mass or hepatosplenomegaly.  He has no inguinal hernias or adenopathy.  GENITALIA:  He has a normal circumcised phallus without lesions or discharge with normal glans and meatus.  Scrotum is normal.  Testicles are descended bilaterally without lesions.  The epididymis is palpably normal.  He has normal anus and perineum with normal rectal tone.  Prostate is 1+ enlarged, smooth, symmetric, but firmer on right side, and bulbar in shape.  No evidence of extraprostatic extension or fixation.  No seminal vesicle abnormality noted.  EXTREMITIES:  Without clubbing, cyanosis, edema, or deformity.  SKIN:  Warm and dry.  NEUROLOGIC:  No focal neurologic deficits and is alert and oriented with appropriate mood and affect.  IMPRESSION: 1. Mild hypertension. 2. Erectile dysfunction, managed with Viagra. 3. Adenocarcinoma of the prostate, Gleason score of 7, in both lobes of    prostate involving 40% of the specimen on right side and 10% on left side.    He had had a prior negative bone scan.  PLAN:  He understands the risks and complications,  especially the possibility of worsening erectile dysfunction.  The risks and complications are outlined in my office notes which have been placed in the chart.  He is therefore admitted for elective radical retropubic prostatectomy and bilateral pelvic lymph dissection today.  1. Routine DVT prophylaxis. 2. Perioperative antibiotics. 3. Aggressive postoperative pulmonary toilet. 4. Bilateral pelvic lymph node dissection.  If clinically negative for any    evidence of disease, proceed with radical retropubic prostatectomy. Dictated  by:   Tony Curtis, M.D. Attending Physician:  [name] DD:  07/08/01 TD:  07/08/01 Job: [id number]

## 2010-12-30 NOTE — Op Note (Signed)
NAME:  Tony Curtis, Tony Curtis                          ACCOUNT NO.:  [id number]   MEDICAL RECORD NO.:  [id number]                   PATIENT TYPE:  AMB   LOCATION:  ENDO                                 FACILITY:  [facility]   PHYSICIAN:  Anselmo Rod, M.D.               DATE OF BIRTH:  [birth year]   DATE OF PROCEDURE:  03/25/2004  DATE OF DISCHARGE:                                 OPERATIVE REPORT   PROCEDURE:  Screening colonoscopy.   ENDOSCOPIST:  Anselmo Rod, M.D.   INSTRUMENT USED:  Olympus video colonoscope.   INDICATIONS FOR PROCEDURE:  A 75 year old African-American male with a  personal history of prostate cancer undergoing a screening colonoscopy to  rule out colonic polyps, masses, hemorrhoids, etc.   PREPROCEDURE PREPARATION:  Informed consent was procured from the patient.  The patient was fasted for eight hours prior to the procedure and prepped  with a bottle of magnesium citrate and a gallon of GoLYTELY the night prior  to the procedure.   PREPROCEDURE PHYSICAL EXAMINATION:  VITAL SIGNS:  Stable.  NECK:  Supple.  CHEST:  Clear to auscultation.  S1 and S2 regular.  ABDOMEN:  Soft with normal bowel sounds.   DESCRIPTION OF PROCEDURE:  The patient was placed in the left lateral  decubitus position and sedated with 60 mg of Demerol and 6 mg of Versed in  slow incremental doses.  Once the patient was adequately sedated and  maintained on low flow oxygen and continuous cardiac monitoring, the Olympus  video colonoscope was advanced from the rectum to the cecum and the  appendiceal orifice and ileocecal valve were clearly visualized and  photographed.  No masses, polyps, erosions, ulcerations, or diverticula were  seen.  The rectum appeared healthy on retroflexion.  The patient tolerated  the procedure well without immediate complications.   IMPRESSION:  Normal colonoscopy up to the cecum.   RECOMMENDATIONS:  Continue a high fiber diet with liberal fluid intake.  Repeat  colonoscopy in the next five years unless the patient develops any  abnormal symptoms in the interim.  Outpatient follow-up as needed arises in  the future.                                               Anselmo Rod, M.D.    JNM/MEDQ  D:  03/25/2004  T:  03/26/2004  Job:  [id number]   cc:   Merlene Laughter. [name], M.D.  [address]  [address]  [place]  [place] [address]  Fax: [phone]

## 2010-12-30 NOTE — Discharge Summary (Signed)
[facility]  Patient:    Tony Curtis, Tony Curtis Visit Number: [id number] MRN: [id number]          Service Type: SUR Location: 3W 0343 02 Attending Physician:  [name] Dictated by:   [name], M.D. Admit Date:  07/08/2001 Discharge Date: 07/10/2001                             Discharge Summary  PRINCIPAL DIAGNOSIS:  Adenocarcinoma of the prostate.  OTHER DIAGNOSES: 1. Gastroesophageal reflux. 2. Hypertension.  MAJOR OPERATION:  Radical retropubic prostatectomy and bilateral pelvic lymph node dissection.  DISPOSITION:  Patient is discharged home in a stable, satisfactory, and improved condition with a Foley catheter draining his bladder.  His pelvic drain has been removed and his follow-up will be in my office in one week for skin staple removal.  His activity will be limited to no heavy lifting, strenuous activity, up and down stairs, or driving.  DISCHARGE MEDICATIONS: 1. Tylox one to two q.4h. p.r.n. #40. 2. Cipro 250 mg p.o. b.i.d. #20. 3. Nexium 40 mg one p.o. q.d. #30. 4. Colace 100 mg one p.o. b.i.d. #30.  BRIEF HISTORY:  Patient is a 75 year old black male patient who was noted to have elevated PSA of 9.5 with a free to total PSA ratio of 8.6.  The exam of his prostate revealed a slightly firm area on the right side and a KUB was performed because of some low back pain, but no lytic lesions were seen.  A bone scan was done and showed no evidence of metastatic disease.  I also obtained a transrectal ultrasound and biopsy of the prostate that revealed no gross extracapsular extension in the biopsy-proven adenocarcinoma of the prostate, Gleason 7, from both lobes of the prostate.  He was admitted for elective radical retropubic prostatectomy and his full history and physical is dictated previously and will not be repeated.  HOSPITAL COURSE:  On July 08, 2001, the patient was taken to the major OR where he underwent a  radical retropubic prostatectomy and bilateral pelvic lymph node dissection without complication or need for transfusion.   He left the operating room with a 22 French Foley catheter in his bladder and a Blake drain in his pelvis and, the night of his surgery, he was noted to be groggy but without complaint.  I have been contacted because of 250 cc of bright red fluid out of his drain; however, he had had irrigation placed within the pelvis just prior to closure which my feeling was that most of this was irrigant.  He was not tachycardic and exhibited no evidence of decreased blood pressure.  Over time during the next eight hours, the drain output diminished significantly.  There was no clinical evidence of active bleeding.  In order to be safe, I did type and cross him for two units of red blood cells and, the following morning, his H&H was checked.  It was 11.4 and 32.1.  His electrolytes were normal.  His abdomen remained soft.  His T max was 99.5. Foley was draining clear urine, and he was begun on early ambulation and clear liquids and his diet was advanced.  On the night of his first postoperative day, he complained of chest versus epigastric discomfort.  Apparently, he had had this at home quite extensively but failed to mention that and was treated with Mylanta after an EKG was obtained that showed no evidence  of changes.  He denies shortness of breath or left arm or jaw pain.  His cardiovascular exam revealed no abnormality.  He got relief with Mylanta.  By his second postoperative day, he was tolerating a regular diet.  He had had a bowel movement the previous day.  He had had a temperature of 101.4 and aggressive pulmonary toilet was instituted and that had fallen and remained normal since. His drain had put out 10 cc the shift previously and, then, 60 cc and was therefore removed.  His pathology returned showing a Gleason 7 adenocarcinoma (4 + 3) in both lobes of his prostate  contained within the prostate and no evidence of positive surgical margin involvement of any contiguous structures and no lymph node involvement (tT2b, N0, MX).  I discussed this with the patient.  It is felt that now that his urine is clear and he is doing much better as far as his reflux goes that he is ready for discharge.  His discharge instructions have been supplied to the patient.  I have gone over them verbally and given him a copy of these which were printed from my office. He was given his prescriptions and I have confirmed he has family here in town that he will be able to spend time with postoperatively to keep an eye on him. He is therefore felt ready for discharge. Dictated by:   [name], M.D. Attending Physician:  [name] DD:  07/10/01 TD:  07/10/01 Job: [id number]

## 2010-12-30 NOTE — Op Note (Signed)
[facility]  Patient:    Tony Curtis, Tony Curtis Visit Number: [id number] MRN: [id number]          Service Type: SUR Location: 3W [id number] Attending Physician:  [name] Dictated by:   [name], M.D. Proc. Date: 07/08/01 Admit Date:  07/08/2001                             Operative Report  PREOPERATIVE DIAGNOSIS:  Adenocarcinoma of the prostate.  POSTOPERATIVE DIAGNOSIS:  Adenocarcinoma of the prostate.  PROCEDURE:  Radical retropubic prostatectomy and bilateral pelvic lymph node dissection.  SURGEON:  Mark C. Vernie Ammons, M.D.  ASSISTANT:  Maretta Bees. Vonita Moss, M.D.  ANESTHESIA:  General.  DRAINS:  22 French Foley and a #10 flat Blake drain in the pelvis.  SPECIMENS:  Prostate as well as right and left pelvic lymph nodes to pathology.  ESTIMATED BLOOD LOSS:  Approximately 200 cc.  COMPLICATIONS:  None.  INDICATIONS:  The patient is a 75 year old black male with biopsy-proven adenocarcinoma of the prostate.  He had a PSA of 9.5 and a smooth prostate that was a litter firmer on the right-hand side by exam.  My ultrasound of the prostate revealed no hyperechoic or hypoechoic lesions and an intact capsule with a 26 g prostate.  The biopsy revealed 40% of the specimen from the right side of the prostate and 10% from the left showing adenocarcinoma Gleason score 7 (4+3).  He has had a prior bone scan because of low back pain that was negative.  He understands the risks, complications, and alternatives as well as limitations. He does understand his risk of worsened erectile dysfunction is extremely high due to his underlying preoperative erectile dysfunction.  He has elected to proceed with surgical therapy.  DESCRIPTION OF PROCEDURE:  The patient received 1 g of Ancef preoperatively and after informed consent, was taken the major OR, placed on the table, and administered general anesthesia in the supine position.  Genitalia and  lower abdomen were sterilely prepped and draped, and a 22 French Foley catheter with 30 cc balloon was inserted in the bladder.  A midline incision was then made in the lower abdomen and carried down through the rectus fascia and with the muscle bellies being parted in the midline.  This afforded access to the retropubic space of Retzius which was developed bluntly.  I developed this laterally as well, exposing the lateral pelvic sidewalls and placed a Bookwalter retractor for exposure.  I then began by performing a right pelvic lymph node dissection by incising along the anterior medial surface of the external iliac vein from the obturator fossa back to the bifurcation of the common iliac vein.  I dissected the lymph node package then off of the vein and pelvic sidewall.  As I proceeded, lymphatics were clipped with hemoclips and divided.  Small vessels were treated in an identical fashion and the lymphatic segments hitting the leg from the obturator fossa were then clipped and divided.  The obturator nerve was identified and spared throughout its course, and the remaining attachments proximally then were clipped and divided, and the nodal package was freed and sent to pathology for permanent section.  The retractor was then replaced to afford exposure of the left side, and a left pelvic lymph node dissection was then undertaken in an identical fashion.  No suspicious lymphadenopathy was noted intraoperatively.  I then directed my attention to the endopelvic fascia which  was pierced with the Metzenbaums and then opened further using right angle clamp.  I then cleaned off the fatty tissue from the puboprostatic ligaments and divided these flush with the symphysis pubis.  A Hohenfellner clamp was then passed beneath the dorsal vein complex anterior to the urethra, and a #1 Vicryl was then passed around the dorsal vein complex and tied distally.  I then divided the dorsal vein complex with a  Bovie down to the urethra which was incised in its mid portion.  The catheter was then brought out through this urethral incision, clamped and divided.  Right angle clamp was placed beneath the urethra, and umbilical tape was passed in this location, and I then divided the posterior urethra.  I dissected the prostate from the rectum using blunt dissection and then used a right angle clamp to isolate the prostatic pedicles.  These were then clipped and divided on both right and left sides. This progressed to the area where Denonvilliers fascia reflected back and down, and this Denonvilliers fascia was then incised.  The ampullae of the vas were isolated, clipped, and divided individually, and the seminal vesicles were also isolated; clips were placed at the blood supply at the tips, and these were divided as well.  Attention was then directed to the bladder neck region anteriorly, and one amp of indigo carmine was administered intravenously.  I then incised at the bladder neck, completely excising the prostate from the bladder neck after identifying the ureteral orifices which were well away from the bladder neck incision.  I reconstructed the bladder neck by everting the bladder neck mucosa with interrupted 4-0 chromic suture.  The bladder neck was then closed in a tennis racquet fashion using 2-0 chromic suture.  Then 2-0 Vicryl sutures were placed at 12, 10, 2, 7, and 5 oclock positions in the urethra and bladder neck.  A new Foley catheter was then passed through the urethra with a #1 Prolene tied to the eye of the catheter and passed through the bladder neck in through the anterior surface of the bladder.  The catheter was then placed into the bladder and filled with 15 cc of water.  The Prolene suture was then brought out through the right lower quadrant abdominal wall and tied to a button.  The retractor was released, allowing the bladder to move into approximation to the urethra,  and mild traction was applied to the Foley catheter.  The bladder neck Vicryl sutures were then individually tied.  The  bladder was irrigated with saline and noted to be clear of any clots or bleeding.  I then placed a Blake drain through a separate stab incision in the left lower quadrant, secured that to the skin with a 2-0 silk suture and connected that to bulb suction.  The pelvis was irrigated again with saline, and the rectus fascia was then reapproximated with a running #1 PDS suture. Subcu tissue was irrigated with saline, and the skin was closed with skin staples.  The Foley catheter was connected to closed-system drainage.  The patient tolerated the procedure well.  There were no intraoperative complications, and sponge, needle, and instrument counts were correct x 2 at the end of the operation. Dictated by:   [name], M.D. Attending Physician:  [name] DD:  07/08/01 TD:  07/08/01 Job: [id number]

## 2011-01-05 NOTE — Discharge Summary (Signed)
NAME:  Tony Curtis, Tony Curtis                ACCOUNT NO.:  [id number]  MEDICAL RECORD NO.:  [id number]           PATIENT TYPE:  O  LOCATION:  1442                         FACILITY:  [facility]  PHYSICIAN:  Peggye Pitt, M.D. DATE OF BIRTH:  1935-09-24  DATE OF ADMISSION:  12/23/2010 DATE OF DISCHARGE:  12/24/2010                              DISCHARGE SUMMARY   PRIMARY CARE PHYSICIAN:  Cala Bradford R. [name], M.D.  DISCHARGE DIAGNOSES: 1. Fecal impaction/constipation, resolved. 2. Coronary artery disease status post coronary artery bypass     grafting. 3. Prostate cancer status post prostatectomy. 4. Hypertension. 5. Hyperlipidemia. 6. History of peripheral vascular disease.  DISCHARGE MEDICATIONS:  Include: 1. Colace 100 mg twice daily. 2. Aspirin 81 mg daily. 3. Coenzyme Q10 1 capsule daily. 4. Fish oil 2000 mg daily. 5. Metoprolol 25 mg twice daily. 6. Mirtazapine 30 mg at bedtime as needed. 7. Multivitamin 1 tablet daily. 8. Plavix 75 mg daily. 9. Pravachol 20 mg daily. 10.Systane 1 drop in both eyes daily. 11.Vitamin C 500 mg 4 tablets daily. 12.Vitamin D3 5000 units 1 tablet daily. 13.Zetia 10 mg daily.  DISPOSITION AND FOLLOWUP:  [name] will be discharged home today in stable and improved condition.  He will follow up with his PCP on an as- needed basis.  CONSULTATION THIS HOSPITALIZATION:  None.  IMAGES AND PROCEDURES:  Include: 1. A chest x-ray on Dec 23, 2010, that showed chronic interstitial     markings with superimposed patchy opacities of unclear etiology. 2. CT scan of the abdomen and pelvis on Dec 23, 2010, that showed no     acute findings of the abdomen or pelvis.  There was a substantial     stool volume in the colon, especially in the rectum  compatible     with clinical constipation/impaction.  HISTORY AND PHYSICAL:  For full details, please see dictation by Dr. Pearson Grippe on Dec 23, 2010.  In brief, [name] is a pleasant 75 year old African-American  gentleman who came into the hospital with complaints of abdominal pain and constipation stating that his last bowel movement was about 10 days ago.  Because of this we are asked to admit him for further evaluation.  HOSPITAL COURSE BY PROBLEM:  Fecal impaction/constipation.  He was given a dose of MiraLax and that has prompted large amounts of bowel movements.  Mr. Castilla in fact states that he has spent all night long going back and forth from the bathroom.  I believe at this point that prolonging his hospitalization will not prove any benefit to him and I will proceed with discharging him home today.  I have given him a prescription for Colace to take twice daily.  Rest of his chronic medical conditions have been stable.  Vitals on day of discharge, blood pressure 104/67, heart rate 101, respirations 20, sats 97% on room air, and temperature of 98.5.     Peggye Pitt, M.D.     EH/MEDQ  D:  12/24/2010  T:  12/24/2010  Job:  [id number]  cc:   Merlene Laughter. [name], M.D. Fax: [phone]  Electronically Signed by Peggye Pitt  M.D. on 01/05/2011 06:45:50 PM

## 2011-01-22 NOTE — H&P (Signed)
NAME:  Tony Curtis, ENBERG NO.:  [id number]  MEDICAL RECORD NO.:  [id number]           PATIENT TYPE:  E  LOCATION:  WLED                         FACILITY:  [facility]  PHYSICIAN:  Massie Maroon, MD        DATE OF BIRTH:  Aug 03, 1936  DATE OF ADMISSION:  12/23/2010 DATE OF DISCHARGE:                             HISTORY & PHYSICAL   CHIEF COMPLAINT:  Constipation.  HISTORY OF PRESENT ILLNESS:  The patient is a 75 year old male with a history of prostate cancer, urinary incontinence, CAD status post MI, CABG who presents with complaints of constipation for the last few weeks.  He has had some constipation since starting this care.  Last bowel movement was about 7 to 10 days ago.  He complains of dull achy lower abdominal pain.  The patient denies any fever, chills, nausea, vomiting, diarrhea, bright red blood per rectum or black stool. Apparently the ED tried to disimpact the patient was unable to.  The patient had a CT scan of the abdomen and pelvis which showed no acute findings in the abdomen and pelvis, however, there was a large amount of stool in the left colon and rectum and the rectal vault measured normal at 7 cm.  There was edema and inflammation around the rectum and soft tissues of pelvic floor.  There was no evidence of diverticulitis. Chest x-ray was negative for any acute process.  There were chronic interstitial markings, few superimposed patchy opacities which in the appropriate setting could reflect a very mild pneumonia.  The patient denies however any cough, fever, or chills .  The patient will be admitted for fecal impaction.  PAST MEDICAL HISTORY: 1. CAD status post MI, status post CABG. 2. Prostate cancer status post prostatectomy. 3. Hypertension. 4. Hyperlipidemia. 5. Peripheral vascular disease status post left and right SFA     stenting.  PAST SURGICAL HISTORY: 1. History of prostatectomy. 2. Right knee surgery for dislocation. 3. CABG in  2009. 4. Cataract surgery.  SOCIAL HISTORY:  The patient is a former smoker.  He does drink occasionally.  He is a [place] [employer] veteran.  He worked at [employer] for 33 years.  He is married.  He has 5 children, 3 sons and 2 daughters and he works part-time for [employer].  His mother died at age 34 of unknown causes.  Father died in 10s of prostate cancer.  ALLERGIES:  No known drug allergies.  MEDICATIONS: 1. Systane 1 gtt both eyes daily. 2. VESIcare 10 mg p.o. b.i.d. 3. Coenzyme Q10 one p.o. daily. 4. Vitamin C 500 mg 2 to 4 p.o. daily. 5. Fish oil 2000 mg p.o. daily. 6. Flaxseed oil 1500 mg p.o. daily. 7. Vitamin D 5000 international units p.o. daily. 8. Plavix 75 mg p.o. daily. 9. Multivitamin 1 p.o. daily. 10.Remeron 30 mg p.o. q.h.s. p.r.n. insomnia. 11.Pravastatin 20 mg p.o. daily. 12.Zetia 10 mg p.o. daily. 13.Enteric-coated aspirin 81 mg p.o. daily. 14.Metoprolol tartrate 25 mg p.o. b.i.d.  REVIEW OF SYSTEMS:  Negative for all 10 organ systems except for pertinent positives stated above.  PHYSICAL EXAMINATION:  VITAL  SIGNS:  Temperature 98.3, pulse 133, blood pressure 121/89, pulse ox is 95% on room air. HEENT:  Anicteric. NECK:  No JVD. HEART:  Borderline tachycardic.  S1, S2.  No murmurs, gallops or rubs. LUNGS:  Clear to auscultation bilaterally. ABDOMEN:  Soft, nontender, nondistended.  Positive bowel sounds. EXTREMITIES:  No cyanosis, clubbing or edema. SKIN:  No rashes.  Midline scar on the chest. LYMPH NODES:  No adenopathy. NEURO EXAM:  Nonfocal.  Cranial nerves II through XII intact.  Reflexes 2+, symmetric, diffuse with downgoing toes bilaterally, motor strength 5/5 in all 4 extremities, pinprick intact.  EKG, sinus tach at 105, normal axis, normal PR interval, poor R-wave progression, T-wave inversion in V1, V2, V5, V6, I, aVL, no prior EKG for comparison.  Chest x-ray, see above HPI.  CT scan of the abdomen and pelvis, see above  HPI.  LABORATORY DATA:  Urinalysis negative.  Sodium 137, potassium 4.3, BUN 12, creatinine 1.03.  AST 28, ALT 14.  WBC 17.2, hemoglobin 16.5, platelet count 214.  ASSESSMENT/PLAN: 1. Fecal impaction:  We will try mineral oil enema and MiraLax 17 g     p.o. b.i.d. and Colace 100 mg p.o. b.i.d.  Hopefully this induce     the bowel movement.  DC Vesicare. 2. Coronary artery disease.  Continue aspirin, Plavix, pravastatin,     Zetia, metoprolol. 3. Insomnia:  Continue Remeron. 4. History of prostate cancer.  Please follow up with Dr. Ihor Gully. 5. Tachycardia:  Almost normalized.  Check cardiac markers and the     patient will be placed on telemetry.  If tachycardia is persistent,     please check a TSH.  Check cardiac 2-D echo. 6. Deep vein thrombosis prophylaxis.  SCDs.     Massie Maroon, MD     JYK/MEDQ  D:  12/23/2010  T:  12/23/2010  Job:  [id number]  cc:   Merlene Laughter. [name], M.D. Fax: [phone]  Veverly Fells. Vernie Ammons, M.D. Fax: [phone]  Marlowe Kays, M.D. Fax: [phone]  Marcelyn Bruins. Nile Riggs, M.D. Fax: [phone]  Ritta Slot, MD Fax: [phone]  Kerin Perna, M.D. [address] [place] [address]  Electronically Signed by Pearson Grippe MD on 01/22/2011 44:01:02 PM

## 2011-03-21 ENCOUNTER — Ambulatory Visit (HOSPITAL_COMMUNITY)
Admission: RE | Admit: 2011-03-21 | Discharge: 2011-03-22 | Disposition: A | Discharge status: Home / Self Care | Payer: Medicare Other | Source: Ambulatory Visit | Attending: Cardiovascular Disease | Admitting: Cardiovascular Disease

## 2011-03-21 DIAGNOSIS — I2510 Atherosclerotic heart disease of native coronary artery without angina pectoris: Secondary | ICD-10-CM | POA: Insufficient documentation

## 2011-03-21 DIAGNOSIS — E785 Hyperlipidemia, unspecified: Secondary | ICD-10-CM | POA: Insufficient documentation

## 2011-03-21 DIAGNOSIS — I70219 Atherosclerosis of native arteries of extremities with intermittent claudication, unspecified extremity: Secondary | ICD-10-CM | POA: Insufficient documentation

## 2011-03-21 DIAGNOSIS — I10 Essential (primary) hypertension: Secondary | ICD-10-CM | POA: Insufficient documentation

## 2011-03-21 LAB — POCT ACTIVATED CLOTTING TIME: Activated Clotting Time: 171 seconds

## 2011-03-22 LAB — BASIC METABOLIC PANEL
CO2: 26 mEq/L (ref 19–32)
Chloride: 104 mEq/L (ref 96–112)
GFR calc Af Amer: >60 mL/min (ref >60)
Potassium: 4.2 mEq/L (ref 3.5–5.1)
Sodium: 139 mEq/L (ref 135–145)

## 2011-03-22 LAB — CBC
MCV: 89.7 fL (ref 78.0–100.0)
Platelets: 212 10*3/uL (ref 150–400)
RBC: 4.26 MIL/uL (ref 4.22–5.81)
RDW: 13.2 % (ref 11.5–15.5)
WBC: 6.4 10*3/uL (ref 4.0–10.5)

## 2011-03-31 NOTE — Procedures (Signed)
NAME:  Tony Curtis, Tony Curtis NO.:  [id number]  MEDICAL RECORD NO.:  [id number]  LOCATION:  6531                         FACILITY:  [facility]  PHYSICIAN:  Tony Curtis, M.D.   DATE OF BIRTH:  02-24-1936  DATE OF PROCEDURE: DATE OF DISCHARGE:                   PERIPHERAL VASCULAR INVASIVE PROCEDURE   PROCEDURE:  Peripheral angiogram/cutting balloon atherectomy, percutaneous transluminal angioplasty and stent.  Tony Curtis is a 75 year old thin-appearing married African American male, father of two, grandfather of 10 grandchildren who does part time custodial work at [employer].  He has history of CAD status post cath on March 20, 2008, revealing total proximal LAD with right-to-left and left-to-left collaterals, 50% ramus branch stenosis with EF of 45% to 50% and inferoapical hypokinesia.  He apparently underwent coronary artery bypass grafting on May 09, 2008, with LIMA to his LAD, vein graft to the diagonal branch and ramus branch.  He has had angioplasty of both SFAs in the past.  His other problems include hypertension, hyperlipidemia.  Last Myoview performed recently was normal.  Dopplers revealed a high-grade distal right SFA stenosis with a decreased right ABI.  He presents now for angiography, potential intervention for lifestyle-limiting claudication.  PROCEDURE DESCRIPTION:  The patient was brought to the Second Floor [facility] in the postabsorptive state.  He was premedicated with p.o. Valium.  His left groin was prepped and shaved in usual sterile fashion.  A 1% Xylocaine was used for local anesthesia.  A 5-French sheath was inserted into the left femoral artery using standard Seldinger technique.  A 5-French tennis racket catheter was used for midstream distal abdominal aortography with bifemoral runoff using bolus chase digital subtraction step table technique.  Visipaque dye was used for the entirety of the  case.  Retrograde aortic pressures were monitored at the end of the patient's case.  ANGIOGRAPHIC RESULTS: 1. Abdominal aorta.     a.     Renal arteries - 30% right renal artery stenosis.     b.     Infrarenal abdominal aorta - normal. 2. Left lower extremity;     a.     A 50% mid left SFA stenosis.     b.     An 80% distal left SFA stenosis.     c.     Three-vessel runoff with "slow flow". 3. Right lower extremity;     a.     A 50% mid right SFA stenosis.     b.     An 80% distal right SFA stenosis.     c.     A 50% popliteal stenosis behind the knee.     d.     Two-vessel runoff with an occluded peroneal and slow flow.  PROCEDURE DESCRIPTION:  A contralateral access was obtained with a crossover catheter, Rosen wire, and 6-French destination sheath.  The patient received 5000 units of heparin intravenously with an ACT of 287. Total contrast for the patient was 208 mL.  Using an 0.014 Spartacore wire, the lesion was crossed.  Cutting balloon atherectomy was performed with a 6 x 2 AngioSculpt at nominal pressures which revealed a marked "dog bone waist effect" consistent with a very tight  lesion.  There was what appeared to be a hemodynamic significant dissection after this and therefore, a 7 x 40 Smart nitinol self-expanding stent was then deployed and postdilated with 6.3 Aviator at nominal pressures resulting in reduction of an 80% focal within the segmental lesion, distal right SFA to 0% residual with excellent flow.  The patient tolerated the procedure well.  The sheath was then withdrawn across the bifurcation and the wire was removed.  The patient left the lab in stable condition.  IMPRESSION:  Successful AngioSculpt cutting balloon atherectomy, percutaneous transluminal angioplasty and stenting of distal right superficial femoral artery stenosis for "lifestyle-limiting claudication."  The patient will be hydrated overnight, continue his aspirin and Plavix, discharge home  in the morning.  I will get followup Dopplers and ABIs, and will see him back after that in the office for followup.  He left the lab in stable condition.     Tony Curtis, M.D.     JB/MEDQ  D:  03/21/2011  T:  03/22/2011  Job:  [id number]  cc:   [facility] [place] R. [name], M.D.  Electronically Signed by Tony Curtis M.D. on 03/31/2011 03:35:13 PM

## 2011-03-31 NOTE — Discharge Summary (Signed)
  NAME:  STANTON, KISSOON NO.:  [id number]  MEDICAL RECORD NO.:  [id number]  LOCATION:  6531                         FACILITY:  [facility]  PHYSICIAN:  Nanetta Batty, M.D.   DATE OF BIRTH:  05/21/1936  DATE OF ADMISSION:  03/21/2011 DATE OF DISCHARGE:  03/22/2011                              DISCHARGE SUMMARY   DISCHARGE DIAGNOSES: 1. Claudication. 2. Vascular disease status post elective right SFA PTA and stenting     this admission. 3. Known coronary disease with coronary artery bypass grafting in     September 2009 with low-risk Myoview on March 06, 2011. 4. Treated hypertension. 5. Treated dyslipidemia.  HOSPITAL COURSE:  The patient is a 75 year old male followed by Dr. [name] and Dr. [name].  He has known coronary artery disease and had bypass grafting in 2009.  Recently, he had a Myoview for some chest pain that was low-risk.  He has been having claudication and ABIs as an outpatient showed moderate high-grade right stenosis.  He was admitted by Dr. [name] for elective PV angiogram.  This was done on March 21, 2011.  He has an 80% distal left SFA narrowing.  He has an 80% right SFA.  The patient underwent right SFA stenting.  He was hydrated overnight.  He will follow up with Dr. [name] in a couple weeks in the office.  LABS AT DISCHARGE:  White count 6.4, hemoglobin 12.8, hematocrit 38.2, platelets 212.  Sodium 139, potassium 4.2, BUN 14, creatinine 1.21.  Please see med rec for complete discharge medications.  DISPOSITION:  The patient was discharged in stable condition and follow up Dr. [name] as noted.     Abelino Derrick, P.A.   ______________________________ Nanetta Batty, M.D.    Lenard Lance  D:  03/22/2011  T:  03/22/2011  Job:  [id number]  cc:   Merlene Laughter. [name], M.D.  Electronically Signed by Corine Shelter P.A. on 03/23/2011 03:43:14 PM Electronically Signed by Nanetta Batty M.D. on 03/31/2011 03:35:11 PM

## 2011-05-15 LAB — BASIC METABOLIC PANEL
BUN: 10
BUN: 10
BUN: 11
BUN: 14
CO2: 23
CO2: 25
CO2: 27
CO2: 29
Calcium: 7.6 — ABNORMAL LOW
Calcium: 8.2 — ABNORMAL LOW
Calcium: 8.3 — ABNORMAL LOW
Calcium: 8.5
Chloride: 105
Chloride: 106
Chloride: 106
Chloride: 115 — ABNORMAL HIGH
Creatinine, Ser: 1.11
Creatinine, Ser: 1.14
Creatinine, Ser: 1.16
Creatinine, Ser: 1.21
GFR calc Af Amer: >60
GFR calc Af Amer: >60
GFR calc Af Amer: >60
GFR calc Af Amer: >60
GFR calc non Af Amer: 59 — ABNORMAL LOW
GFR calc non Af Amer: >60
GFR calc non Af Amer: >60
GFR calc non Af Amer: >60
Glucose, Bld: 125 — ABNORMAL HIGH
Glucose, Bld: 140 — ABNORMAL HIGH
Glucose, Bld: 192 — ABNORMAL HIGH
Glucose, Bld: 98
Potassium: 3.7
Potassium: 3.8
Potassium: 4.1
Potassium: 4.4
Sodium: 138
Sodium: 140
Sodium: 141
Sodium: 141

## 2011-05-15 LAB — CBC
HCT: 29 — ABNORMAL LOW
HCT: 30.7 — ABNORMAL LOW
HCT: 31 — ABNORMAL LOW
HCT: 31.9 — ABNORMAL LOW
HCT: 32.8 — ABNORMAL LOW
HCT: 35.5 — ABNORMAL LOW
HCT: 43.4
Hemoglobin: 10.4 — ABNORMAL LOW
Hemoglobin: 10.4 — ABNORMAL LOW
Hemoglobin: 10.8 — ABNORMAL LOW
Hemoglobin: 11.2 — ABNORMAL LOW
Hemoglobin: 11.8 — ABNORMAL LOW
Hemoglobin: 14.4
Hemoglobin: 9.8 — ABNORMAL LOW
MCHC: 33.2
MCHC: 33.2
MCHC: 33.6
MCHC: 33.8
MCHC: 33.8
MCHC: 33.9
MCHC: 34
MCV: 91.2
MCV: 91.8
MCV: 92.5
MCV: 92.5
MCV: 93
MCV: 93
MCV: 93.1
Platelets: 132 — ABNORMAL LOW
Platelets: 134 — ABNORMAL LOW
Platelets: 139 — ABNORMAL LOW
Platelets: 145 — ABNORMAL LOW
Platelets: 149 — ABNORMAL LOW
Platelets: 178
Platelets: 227
RBC: 3.19 — ABNORMAL LOW
RBC: 3.3 — ABNORMAL LOW
RBC: 3.35 — ABNORMAL LOW
RBC: 3.43 — ABNORMAL LOW
RBC: 3.57 — ABNORMAL LOW
RBC: 3.84 — ABNORMAL LOW
RBC: 4.67
RDW: 13.1
RDW: 13.2
RDW: 13.3
RDW: 13.4
RDW: 13.7
RDW: 13.9
RDW: 14
WBC: 10.2
WBC: 10.2
WBC: 10.6 — ABNORMAL HIGH
WBC: 5.6
WBC: 5.8
WBC: 8.2
WBC: 8.4

## 2011-05-15 LAB — GLUCOSE, CAPILLARY
Glucose-Capillary: 101 — ABNORMAL HIGH
Glucose-Capillary: 102 — ABNORMAL HIGH
Glucose-Capillary: 107 — ABNORMAL HIGH
Glucose-Capillary: 109 — ABNORMAL HIGH
Glucose-Capillary: 110 — ABNORMAL HIGH
Glucose-Capillary: 117 — ABNORMAL HIGH
Glucose-Capillary: 119 — ABNORMAL HIGH
Glucose-Capillary: 121 — ABNORMAL HIGH
Glucose-Capillary: 122 — ABNORMAL HIGH
Glucose-Capillary: 122 — ABNORMAL HIGH
Glucose-Capillary: 123 — ABNORMAL HIGH
Glucose-Capillary: 130 — ABNORMAL HIGH
Glucose-Capillary: 132 — ABNORMAL HIGH
Glucose-Capillary: 137 — ABNORMAL HIGH
Glucose-Capillary: 157 — ABNORMAL HIGH
Glucose-Capillary: 163 — ABNORMAL HIGH
Glucose-Capillary: 68 — ABNORMAL LOW
Glucose-Capillary: 82
Glucose-Capillary: 82
Glucose-Capillary: 83
Glucose-Capillary: 85
Glucose-Capillary: 93
Glucose-Capillary: 94
Glucose-Capillary: 96
Glucose-Capillary: 99

## 2011-05-15 LAB — TYPE AND SCREEN
ABO/RH(D): AB POS
Antibody Screen: NEGATIVE

## 2011-05-15 LAB — COMPREHENSIVE METABOLIC PANEL
ALT: 11
AST: 17
Albumin: 3.7
Alkaline Phosphatase: 88
BUN: 17
CO2: 23
Calcium: 8.9
Chloride: 109
Creatinine, Ser: 1.09
GFR calc Af Amer: >60
GFR calc non Af Amer: >60
Glucose, Bld: 86
Potassium: 4.5
Sodium: 138
Total Bilirubin: 0.6
Total Protein: 6.5

## 2011-05-15 LAB — POCT I-STAT 3, ART BLOOD GAS (G3+)
Acid-Base Excess: 1
Acid-base deficit: 2
Acid-base deficit: 3 — ABNORMAL HIGH
Acid-base deficit: 3 — ABNORMAL HIGH
Bicarbonate: 20.4
Bicarbonate: 22.2
Bicarbonate: 23.7
Bicarbonate: 24.7 — ABNORMAL HIGH
O2 Saturation: 100
O2 Saturation: 100
O2 Saturation: 96
O2 Saturation: 99
Patient temperature: 34.7
Patient temperature: 37.3
TCO2: 21
TCO2: 23
TCO2: 25
TCO2: 26
pCO2 arterial: 27.9 — ABNORMAL LOW
pCO2 arterial: 32.2 — ABNORMAL LOW
pCO2 arterial: 41.4
pCO2 arterial: 44.7
pH, Arterial: 7.333 — ABNORMAL LOW
pH, Arterial: 7.339 — ABNORMAL LOW
pH, Arterial: 7.462 — ABNORMAL HIGH
pH, Arterial: 7.492 — ABNORMAL HIGH
pO2, Arterial: 123 — ABNORMAL HIGH
pO2, Arterial: 349 — ABNORMAL HIGH
pO2, Arterial: 378 — ABNORMAL HIGH
pO2, Arterial: 91

## 2011-05-15 LAB — PROTIME-INR
INR: 1
INR: 1.5
Prothrombin Time: 13.1
Prothrombin Time: 19 — ABNORMAL HIGH

## 2011-05-15 LAB — HEMOGLOBIN A1C
Hgb A1c MFr Bld: 5.8
Mean Plasma Glucose: 120

## 2011-05-15 LAB — POCT I-STAT 4, (NA,K, GLUC, HGB,HCT)
Glucose, Bld: 163 — ABNORMAL HIGH
Glucose, Bld: 81
Glucose, Bld: 87
Glucose, Bld: 88
Glucose, Bld: 89
Glucose, Bld: 90
Glucose, Bld: 98
HCT: 20 — ABNORMAL LOW
HCT: 21 — ABNORMAL LOW
HCT: 24 — ABNORMAL LOW
HCT: 32 — ABNORMAL LOW
HCT: 32 — ABNORMAL LOW
HCT: 38 — ABNORMAL LOW
HCT: 39
Hemoglobin: 10.9 — ABNORMAL LOW
Hemoglobin: 10.9 — ABNORMAL LOW
Hemoglobin: 12.9 — ABNORMAL LOW
Hemoglobin: 13.3
Hemoglobin: 6.8 — CL
Hemoglobin: 7.1 — CL
Hemoglobin: 8.2 — ABNORMAL LOW
Potassium: 3.2 — ABNORMAL LOW
Potassium: 3.9
Potassium: 4
Potassium: 4
Potassium: 4.1
Potassium: 4.5
Potassium: 4.5
Sodium: 135
Sodium: 138
Sodium: 139
Sodium: 142
Sodium: 143
Sodium: 145
Sodium: 148 — ABNORMAL HIGH

## 2011-05-15 LAB — URINALYSIS, ROUTINE W REFLEX MICROSCOPIC
Bilirubin Urine: NEGATIVE
Glucose, UA: NEGATIVE
Hgb urine dipstick: NEGATIVE
Ketones, ur: NEGATIVE
Nitrite: POSITIVE — AB
Protein, ur: NEGATIVE
Specific Gravity, Urine: 1.023
Urobilinogen, UA: 0.2
pH: 5

## 2011-05-15 LAB — BLOOD GAS, ARTERIAL
Acid-base deficit: 0.8
Bicarbonate: 23.6
Drawn by: 206361
FIO2: 0.21
O2 Saturation: 96.3
Patient temperature: 98.6
TCO2: 24.8
pCO2 arterial: 40.7
pH, Arterial: 7.382
pO2, Arterial: 88.8

## 2011-05-15 LAB — POCT I-STAT, CHEM 8
BUN: 12
Calcium, Ion: 1.14
Chloride: 103
Creatinine, Ser: 1.3
Glucose, Bld: 146 — ABNORMAL HIGH
HCT: 31 — ABNORMAL LOW
Hemoglobin: 10.5 — ABNORMAL LOW
Potassium: 4.3
Sodium: 140
TCO2: 25

## 2011-05-15 LAB — URINE MICROSCOPIC-ADD ON

## 2011-05-15 LAB — MAGNESIUM
Magnesium: 2.2
Magnesium: 2.4
Magnesium: 2.6 — ABNORMAL HIGH
Magnesium: 3.1 — ABNORMAL HIGH

## 2011-05-15 LAB — PREPARE PLATELET PHERESIS

## 2011-05-15 LAB — PLATELET COUNT: Platelets: 113 — ABNORMAL LOW

## 2011-05-15 LAB — HEMOGLOBIN AND HEMATOCRIT, BLOOD
HCT: 23.5 — ABNORMAL LOW
Hemoglobin: 8 — ABNORMAL LOW

## 2011-05-15 LAB — CREATININE, SERUM
Creatinine, Ser: 1.03
Creatinine, Ser: 1.26
GFR calc Af Amer: >60
GFR calc Af Amer: >60
GFR calc non Af Amer: 57 — ABNORMAL LOW
GFR calc non Af Amer: >60

## 2011-05-15 LAB — APTT
aPTT: 29
aPTT: 41 — ABNORMAL HIGH

## 2011-05-15 LAB — ABO/RH: ABO/RH(D): AB POS

## 2011-05-19 LAB — GLUCOSE, CAPILLARY: Glucose-Capillary: 131 mg/dL — ABNORMAL HIGH (ref 70–99)

## 2011-06-08 ENCOUNTER — Encounter: Payer: Self-pay | Admitting: Cardiothoracic Surgery

## 2011-06-08 DIAGNOSIS — I739 Peripheral vascular disease, unspecified: Secondary | ICD-10-CM | POA: Insufficient documentation

## 2011-06-08 DIAGNOSIS — I10 Essential (primary) hypertension: Secondary | ICD-10-CM | POA: Insufficient documentation

## 2011-06-08 DIAGNOSIS — E785 Hyperlipidemia, unspecified: Secondary | ICD-10-CM | POA: Insufficient documentation

## 2011-06-08 DIAGNOSIS — C61 Malignant neoplasm of prostate: Secondary | ICD-10-CM

## 2011-06-08 DIAGNOSIS — I2510 Atherosclerotic heart disease of native coronary artery without angina pectoris: Secondary | ICD-10-CM | POA: Insufficient documentation

## 2011-06-14 ENCOUNTER — Ambulatory Visit (INDEPENDENT_AMBULATORY_CARE_PROVIDER_SITE_OTHER): Payer: Medicare Other | Admitting: Cardiothoracic Surgery

## 2011-06-14 ENCOUNTER — Encounter: Payer: Self-pay | Admitting: Cardiothoracic Surgery

## 2011-06-14 VITALS — BP 132/92 | HR 106 | Temp 97.1°F | Resp 16

## 2011-06-14 DIAGNOSIS — R52 Pain, unspecified: Secondary | ICD-10-CM

## 2011-06-14 DIAGNOSIS — I2510 Atherosclerotic heart disease of native coronary artery without angina pectoris: Secondary | ICD-10-CM

## 2011-06-14 DIAGNOSIS — M546 Pain in thoracic spine: Secondary | ICD-10-CM

## 2011-06-14 NOTE — Progress Notes (Signed)
[address]            [name] [address]          [phone]    HPI  [name] is a 75 year old black male who is self-referred for upper thoracic and right subscapular back pain. He is status post CABG x3 by myself in September 2009 when he presented with dyspnea on exertion and was found have significant two-vessel disease. He underwent left IMA grafting to his LAD and vein graft to the diagonal and ramus intermediate. His LAD was chronically occluded. His EF was mildly to moderately depressed. He had an uncomplicated course of. Earlier this year in August he had a Myoview cardiac scan which was low risk. Subsequent to that he underwent a right SFA PCI by Dr. [name] for 80% stenosis and claudication. His claudication is significantly improved.  [name] describes his pain is being medial to the right scapula, worse when he rolls in bed, improved by all parameters, but also somewhat related to activity. It radiates to his neck as well as to his chest. He is concerned that this may be related to some symptoms he had prior to his bypass surgery.  Reviewing [name] October from 3 years ago was noted that he had somewhat poor conduit in that vein was harvested from both legs. He also had suboptimal targets.  Current Outpatient Prescriptions  Medication Sig Dispense Refill  . aspirin 81 MG tablet Take 81 mg by mouth daily.        . metoprolol (TOPROL-XL) 50 MG 24 hr tablet Take 50 mg by mouth daily.        . mirtazapine (REMERON) 30 MG tablet Take 30 mg by mouth at bedtime.        Marland Kitchen oxybutynin (DITROPAN) 5 MG tablet Take 5 mg by mouth 2 (two) times daily as needed.        . pantoprazole (PROTONIX) 40 MG tablet Take 40 mg by mouth daily.        . pravastatin (PRAVACHOL) 20 MG tablet Take 20 mg by mouth daily.        . traMADol (ULTRAM) 50 MG tablet Take 50 mg by mouth every 8 (eight) hours as needed. Maximum dose= 8 tablets per day       .  beta carotene w/minerals (OCUVITE) tablet Take 1 tablet by mouth daily.        . clopidogrel (PLAVIX) 75 MG tablet Take 75 mg by mouth daily.        Marland Kitchen co-enzyme Q-10 30 MG capsule Take 30 mg by mouth 3 (three) times daily.        Marland Kitchen ezetimibe (ZETIA) 10 MG tablet Take 10 mg by mouth daily.        . fish oil-omega-3 fatty acids 1000 MG capsule Take 2 g by mouth daily.        . Flaxseed, Linseed, (FLAX SEED OIL PO) Take by mouth.        Tony Curtis Glycol-Propyl Glycol (SYSTANE) 0.4-0.3 % SOLN Apply to eye 1 day or 1 dose.        . solifenacin (VESICARE) 10 MG tablet Take 5 mg by mouth daily.        . vitamin C (ASCORBIC ACID) 500 MG tablet Take 500 mg by mouth daily.        . Vitamin D, Ergocalciferol, (DRISDOL)  50000 UNITS CAPS Take 50,000 Units by mouth.           Review of Systems: He has lost weight. He still works. His primary care is through the [facility] as well as Dr. Andi Curtis. He is a nonsmoker.   Physical Exam Vital signs are stable. Is afebrile. Oxygen saturation 96% on room air. GENERAL he is an elderly male chronically ill and very thin. THORACIC no palpable tenderness or abnormality or deformity of the thorax. Well-healed sternal incision. Breath sounds clear. CARDIAC regular rhythm without murmur or gallop. EXTREMITIES no tenderness warm good femoral and radial pulses. NEURO normal gait no focal weakness.   Diagnostic Tests: His last chest x-ray is reviewed from May which shows emphysema, some interstitial fibrosis of mild degree, no pleural effusion. No enlarged aortic shadow.  Impression: Back pain probable musculoskeletal but may be related to recurrent coronary disease. Recommended the patient be reevaluated by Dr. [name] for possible cardiac catheterization.  Plan: He'll return as needed.

## 2011-06-14 NOTE — Patient Instructions (Signed)
Continue current medications. 

## 2011-06-15 DIAGNOSIS — Z0279 Encounter for issue of other medical certificate: Secondary | ICD-10-CM

## 2011-07-03 ENCOUNTER — Other Ambulatory Visit: Payer: Self-pay | Admitting: Cardiovascular Disease

## 2011-07-04 ENCOUNTER — Ambulatory Visit (HOSPITAL_COMMUNITY)
Admission: RE | Admit: 2011-07-04 | Discharge: 2011-07-04 | Disposition: A | Discharge status: Home / Self Care | Payer: Medicare Other | Source: Ambulatory Visit | Attending: Cardiovascular Disease | Admitting: Cardiovascular Disease

## 2011-07-04 ENCOUNTER — Encounter (HOSPITAL_COMMUNITY)
Admission: RE | Disposition: A | Discharge status: Home / Self Care | Payer: Self-pay | Source: Ambulatory Visit | Attending: Cardiovascular Disease

## 2011-07-04 DIAGNOSIS — I2510 Atherosclerotic heart disease of native coronary artery without angina pectoris: Secondary | ICD-10-CM | POA: Insufficient documentation

## 2011-07-04 DIAGNOSIS — I25810 Atherosclerosis of coronary artery bypass graft(s) without angina pectoris: Secondary | ICD-10-CM | POA: Insufficient documentation

## 2011-07-04 DIAGNOSIS — R0789 Other chest pain: Secondary | ICD-10-CM | POA: Insufficient documentation

## 2011-07-04 HISTORY — PX: LEFT HEART CATHETERIZATION WITH CORONARY/GRAFT ANGIOGRAM: SHX5450

## 2011-07-04 HISTORY — PX: CARDIAC CATHETERIZATION: SHX172

## 2011-07-04 SURGERY — LEFT HEART CATHETERIZATION WITH CORONARY/GRAFT ANGIOGRAM

## 2011-07-04 SURGERY — LEFT HEART CATHETERIZATION WITH CORONARY/GRAFT ANGIOGRAM
Anesthesia: LOCAL

## 2011-07-04 MED ORDER — HEPARIN (PORCINE) IN NACL 2-0.9 UNIT/ML-% IJ SOLN
INTRAMUSCULAR | Status: AC
  Filled 2011-07-04: qty 2000

## 2011-07-04 MED ORDER — MIDAZOLAM HCL 2 MG/2ML IJ SOLN
INTRAMUSCULAR | Status: AC
  Filled 2011-07-04: qty 2

## 2011-07-04 MED ORDER — NITROGLYCERIN 0.2 MG/ML ON CALL CATH LAB
INTRAVENOUS | Status: AC
  Filled 2011-07-04: qty 1

## 2011-07-04 MED ORDER — FENTANYL CITRATE 0.05 MG/ML IJ SOLN
INTRAMUSCULAR | Status: AC
  Filled 2011-07-04: qty 2

## 2011-07-04 MED ORDER — ONDANSETRON HCL 4 MG/2ML IJ SOLN: 4.0000 mg | Freq: Four times a day (QID) | INTRAMUSCULAR | Status: DC | PRN

## 2011-07-04 MED ORDER — SODIUM CHLORIDE 0.9 % IV SOLN: INTRAVENOUS | Status: DC

## 2011-07-04 MED ORDER — LIDOCAINE HCL (PF) 1 % IJ SOLN
INTRAMUSCULAR | Status: AC
  Filled 2011-07-04: qty 30

## 2011-07-04 MED ORDER — DIAZEPAM 5 MG PO TABS
5.0000 mg | ORAL_TABLET | ORAL | Status: AC
  Administered 2011-07-04: 5 mg via ORAL
  Filled 2011-07-04: qty 1

## 2011-07-04 MED ORDER — SODIUM CHLORIDE 0.9 % IV SOLN: INTRAVENOUS | Status: AC

## 2011-07-04 MED ORDER — SODIUM CHLORIDE 0.9 % IJ SOLN: 3.0000 mL | Freq: Two times a day (BID) | INTRAMUSCULAR | Status: DC

## 2011-07-04 MED ORDER — SODIUM CHLORIDE 0.9 % IJ SOLN: 3.0000 mL | INTRAMUSCULAR | Status: DC | PRN

## 2011-07-04 MED ORDER — ACETAMINOPHEN 325 MG PO TABS: 650.0000 mg | ORAL_TABLET | ORAL | Status: DC | PRN

## 2011-07-04 NOTE — Brief Op Note (Signed)
    Dictation#[id number] VHQ#[id number]  UXL#[id number]   Runell Gess 07/04/2011 2:51 PM

## 2011-07-04 NOTE — Progress Notes (Signed)
Pt son present to pick up pt for discharge.  Instructions reviewed with pt and son who verbalize understanding and deny further questions.

## 2011-07-04 NOTE — Progress Notes (Signed)
Pt's family not here to pick pt up.  Pt called his wife who states his son is on his way.

## 2011-07-04 NOTE — H&P (Signed)
   [id number]  Tony Curtis 07/04/2011 2:42 PM   See dictated H & P already done in office 11/19

## 2011-07-04 NOTE — Progress Notes (Signed)
Still no family present.  Pt called his wife again.  Family on the way

## 2011-07-04 NOTE — Cardiovascular Report (Signed)
NAME:  Tony Curtis, Tony Curtis NO.:  [id number]  MEDICAL RECORD NO.:  [id number]  LOCATION:  [facility]                         FACILITY:  [facility]  PHYSICIAN:  Tony Curtis, M.D.   DATE OF BIRTH:  09/14/1935  DATE OF PROCEDURE: DATE OF DISCHARGE:  07/04/2011                           CARDIAC CATHETERIZATION   HISTORY OF PRESENT ILLNESS:  Tony Curtis is a 75 year old, thin-appearing African American male, who I just saw in the office yesterday.  He has history of CAD status post catheterization on March 20, 2008, revealing a total proximal LAD with left-to-left and right-to-left collaterals as well as 50% ramus branch stenosis with an EF of 45-50% with inferoapical hypokinesia.  He underwent bypass grafting by Tony Curtis on May 09, 2008, with LIMA to his LAD, vein to the diagonal branch and ramus branch.  He has had angioplasties of both SFAs in the past. His other problems include hypertension and hyperlipidemia.  He has been complaining of atypical chest, back, shoulder and neck pain and saw Tony Curtis back in the office.  He was referred to me for further evaluation for diagnostic coronary arteriography.  He had a negative Myoview as recently as February 24, 2011.  PROCEDURE DESCRIPTION:  The patient was brought to the [facility] in a postabsorptive state.  He was premedicated with p.o. Valium, IV Versed, and fentanyl.  His right groin was prepped and shaved in usual sterile fashion.  Xylocaine 1% was used for local anesthesia.  A 5-French sheath was inserted into the right femoral artery using standard Seldinger technique.  A 5-French right and left Judkins diagnostic catheter as well as a 5-French pigtail catheter were used for selective coronary angiography, selective vein graft angiography, selective IMA angiography, and left ventriculography. Visipaque dye was used for the entirety of the case.  Retrograde aortic, left  ventricular, and pullback pressures were recorded.  HEMODYNAMICS: 1. Aortic systolic pressure 140, diastolic pressure 82. 2. Left ventricular systolic pressure 133, end-diastolic pressure 14.  SELECTIVE CORONARY ANGIOGRAPHY: 1. Left main; left main had a 30-40% distal taper stenosis. 2. LAD; occluded at its ostium. 3. Ramus branch; moderate size distally back bifurcating vessel with a     30% segmental proximal stenosis. 4. Left circumflex; codominant and free of significant disease. 5. Right coronary artery; codominant and free of significant disease. 6. Vein graft to the diagonal branch and ramus branch; occluded at the     origin both by selective angiography and root angiography. 7. RIMA to the LAD widely patent. 8. Left ventriculography; RAO left ventriculogram was performed using     25 mL of Visipaque dye at 12 mL/second.  The overall LVEF estimated     at greater than 60% without focal wall motion abnormalities.  IMPRESSION:  Tony Curtis has 1/3 grafts patent with occluded vein to the ramus and diagonal branches.  I am unsure of the etiology of his chest pain but with a negative Myoview recently and atypical nature of his symptoms and normal left ventricular function, I suspect these are noncardiac.  Sheath was removed and pressure was held in the groin to achieve hemostasis.  The  patient left the lab in stable condition.  He will be hydrated, remain recumbent for 45 hours and will be discharged home.  I will see him back in the office in followup.     Tony Curtis, M.D.     JB/MEDQ  D:  07/04/2011  T:  07/04/2011  Job:  [id number]  cc:   [facility] Kerin Curtis, M.D. Tony Curtis. Tony Curtis, M.D.

## 2012-06-07 ENCOUNTER — Ambulatory Visit
Admission: RE | Admit: 2012-06-07 | Discharge: 2012-06-07 | Disposition: A | Discharge status: Home / Self Care | Payer: Medicare Other | Source: Ambulatory Visit | Attending: Internal Medicine | Admitting: Internal Medicine

## 2012-06-07 ENCOUNTER — Other Ambulatory Visit: Payer: Self-pay | Admitting: Internal Medicine

## 2012-06-07 DIAGNOSIS — R079 Chest pain, unspecified: Secondary | ICD-10-CM

## 2012-10-13 ENCOUNTER — Encounter: Payer: Self-pay | Admitting: *Deleted

## 2012-11-07 ENCOUNTER — Encounter: Payer: Self-pay | Admitting: Cardiovascular Disease

## 2013-02-03 ENCOUNTER — Emergency Department (HOSPITAL_COMMUNITY): Payer: Medicare Other

## 2013-02-03 ENCOUNTER — Observation Stay (HOSPITAL_COMMUNITY)
Admission: EM | Admit: 2013-02-03 | Discharge: 2013-02-06 | Disposition: A | Discharge status: Home / Self Care | Payer: Medicare Other | Attending: Internal Medicine | Admitting: Internal Medicine

## 2013-02-03 DIAGNOSIS — R079 Chest pain, unspecified: Secondary | ICD-10-CM | POA: Diagnosis present

## 2013-02-03 DIAGNOSIS — C61 Malignant neoplasm of prostate: Secondary | ICD-10-CM

## 2013-02-03 DIAGNOSIS — E785 Hyperlipidemia, unspecified: Secondary | ICD-10-CM | POA: Insufficient documentation

## 2013-02-03 DIAGNOSIS — I25810 Atherosclerosis of coronary artery bypass graft(s) without angina pectoris: Secondary | ICD-10-CM | POA: Insufficient documentation

## 2013-02-03 DIAGNOSIS — I10 Essential (primary) hypertension: Secondary | ICD-10-CM | POA: Insufficient documentation

## 2013-02-03 DIAGNOSIS — R072 Precordial pain: Principal | ICD-10-CM | POA: Insufficient documentation

## 2013-02-03 DIAGNOSIS — M129 Arthropathy, unspecified: Secondary | ICD-10-CM | POA: Insufficient documentation

## 2013-02-03 DIAGNOSIS — Z8546 Personal history of malignant neoplasm of prostate: Secondary | ICD-10-CM | POA: Insufficient documentation

## 2013-02-03 DIAGNOSIS — I739 Peripheral vascular disease, unspecified: Secondary | ICD-10-CM

## 2013-02-03 DIAGNOSIS — Z681 Body mass index (BMI) 19 or less, adult: Secondary | ICD-10-CM | POA: Insufficient documentation

## 2013-02-03 DIAGNOSIS — I2510 Atherosclerotic heart disease of native coronary artery without angina pectoris: Secondary | ICD-10-CM | POA: Insufficient documentation

## 2013-02-03 DIAGNOSIS — Z79899 Other long term (current) drug therapy: Secondary | ICD-10-CM | POA: Insufficient documentation

## 2013-02-03 DIAGNOSIS — E43 Unspecified severe protein-calorie malnutrition: Secondary | ICD-10-CM | POA: Insufficient documentation

## 2013-02-03 HISTORY — DX: Gastro-esophageal reflux disease without esophagitis: K21.9

## 2013-02-03 HISTORY — DX: Acute myocardial infarction, unspecified: I21.9

## 2013-02-03 LAB — BASIC METABOLIC PANEL
BUN: 17 mg/dL (ref 6–23)
Chloride: 105 mEq/L (ref 96–112)
Glucose, Bld: 154 mg/dL — ABNORMAL HIGH (ref 70–99)
Potassium: 4.3 mEq/L (ref 3.5–5.1)

## 2013-02-03 LAB — CBC
HCT: 37.1 % — ABNORMAL LOW (ref 39.0–52.0)
Hemoglobin: 12.7 g/dL — ABNORMAL LOW (ref 13.0–17.0)
MCV: 91.4 fL (ref 78.0–100.0)
WBC: 9.5 10*3/uL (ref 4.0–10.5)

## 2013-02-03 NOTE — ED Notes (Signed)
Apologized to pt for wait to see provider. Explained to pt delay. Informed pt of results thus far. Meal given to pt and family. VSS. Pt continues to deny pain, or complaint at this time.

## 2013-02-03 NOTE — ED Notes (Signed)
Per EMS: Pt reports sudden onset of central stabbing  CP at work. Took 1 nitro prior to EMS arrival with minimal relief. Pt given 325 mg aspirin and 1 nitro with EMS. Pt reports pain radiates to back but denies SOB, N/V, diaphoresis. Hx: triple bypass 2009,2 stents in bilateral legs, prostate cancer, HTN. EKG unremarkable. 137/80. 100 SR. 18 RR.

## 2013-02-03 NOTE — ED Provider Notes (Signed)
History    CSN: [id number] Arrival date & time 02/03/13  1844  First MD Initiated Contact with Patient 02/03/13 2152     Chief Complaint  Patient presents with  . Chest Pain   (Consider location/radiation/quality/duration/timing/severity/associated sxs/prior Treatment) HPI Comments: Tony Curtis is a 77 y.o. Male presents for evaluation of chest pain. The pain is gone now. He had pain earlier that came on while he was cleaning a closet "at work." He was treated by EMS with 2 nitroglycerin, and one aspirin. Sometime after that the pain resolved. He denies associated weakness, dizziness, shortness of breath, nausea, vomiting, or diaphoresis. He has had cardiac catheterization in 2012. He has not seen his cardiologist for a year. He follows at the [facility] in [place], and at the [facility] in [place], [place]. He lives with his wife. He, states he is taking his usual medications, without relief. There are no other known modifying factors  Patient is a 77 y.o. male presenting with chest pain. The history is provided by the patient.  Chest Pain  Past Medical History  Diagnosis Date  . HTN (hypertension)   . Hyperlipemia   . CAD (coronary artery disease)     last nuc 02/24/11- no ischemia, small, fixed distal anterior, apicla anterior defect (scar); last cath 07/04/11-1/3 grafts patent, occluded vein to ramus and diag branches  . Prostate cancer   . PVD (peripheral vascular disease)     LE dopp (10/19/11) patent R SFA stent, R ant tib occluded with reconstitution within the dorsalis pedis, L SFA 70-99%, L ant tib occluded with reconstitution of flow in the dorsalis pedis, bil ABI 1.2; carotid dopp (04-27-08)-normal  . H/O echocardiogram 07/08/2008    EF 40-45%, mod-severe inferior wall hypokinesis; no sign valvular disease   Past Surgical History  Procedure Laterality Date  . History of prostatectomy.    . Right knee surgery for dislocation.    . Cabg in 2009.  05/06/2008    x3, LIMA to LAD, SVG to diad, SVG to ramus inermediate  . Cataract surgery.    . Cardiac catheterization  07/04/2011    patent LIMA to diag branch, occluded ben grafts to diag and ramus branch; EF >60%   . Cardiac catheterization  03/20/2008    occluded prox LAD with great collaterals from the ramus and the circ and RCA to LAD; EF 45-50%, med tx  . Abdominal angiogram  03/21/11    angiosculpt curring blloon atherectomy, PTA and stentin of distal R SFA  . Abdominal angiogram  10/13/08    PTA and angioacoring with 5x21mm AngioSculpt balloon of the L distal SFA and L pop artery  . Abdominal angiogram  07/28/2008    PTA and balloon angioplasty with scoring balloon angioplasty with angioscope of the R distal SFA and R pop artery   No family history on file. History  Substance Use Topics  . Smoking status: Former Smoker    Quit date: 06/14/1979  . Smokeless tobacco: Never Used  . Alcohol Use: Yes    Review of Systems  Cardiovascular: Positive for chest pain.  All other systems reviewed and are negative.    Allergies  Acetaminophen  Home Medications   Current Outpatient Rx  Name  Route  Sig  Dispense  Refill  . aspirin EC 81 MG tablet   Oral   Take 81 mg by mouth daily.         . cholecalciferol (VITAMIN D) 1000 UNITS tablet   Oral  Take 1,000 Units by mouth daily.         . folic acid (FOLVITE) 1 MG tablet   Oral   Take 1 mg by mouth See admin instructions. Take on every day except Fridays (don't take with methotrexate)         . methotrexate (RHEUMATREX) 2.5 MG tablet   Oral   Take 7.5 mg by mouth once a week. Caution:Chemotherapy. Protect from light. Take on Fridays         . metoprolol (LOPRESSOR) 50 MG tablet   Oral   Take 25 mg by mouth 2 (two) times daily.         . mirtazapine (REMERON) 15 MG tablet   Oral   Take 15 mg by mouth at bedtime.         . nitroGLYCERIN (NITROSTAT) 0.4 MG SL tablet   Sublingual   Place 0.4 mg under the tongue every 5  (five) minutes as needed for chest pain.         Marland Kitchen oxybutynin (DITROPAN) 5 MG tablet   Oral   Take 5 mg by mouth 2 (two) times daily.          . pantoprazole (PROTONIX) 40 MG tablet   Oral   Take 40 mg by mouth 2 (two) times daily as needed (heartburn).          Bertram Gala Glycol-Propyl Glycol (SYSTANE) 0.4-0.3 % SOLN   Both Eyes   Place 1 drop into both eyes 2 (two) times daily as needed (dry eyes).          . pravastatin (PRAVACHOL) 40 MG tablet   Oral   Take 40 mg by mouth at bedtime.         . predniSONE (DELTASONE) 5 MG tablet   Oral   Take 7.5 mg by mouth daily. Takes with milk         . PRESCRIPTION MEDICATION   Topical   Apply 1 application topically at bedtime as needed (for back and neck pain). Medicated cream from [place]         . traMADol (ULTRAM) 50 MG tablet   Oral   Take 100 mg by mouth 3 (three) times daily as needed for pain. Maximum dose= 8 tablets per day          BP 135/95  Pulse 67  Temp(Src) 98.1 F (36.7 C) (Oral)  Resp 22  SpO2 97% Physical Exam  Nursing note and vitals reviewed. Constitutional: He is oriented to person, place, and time. He appears well-developed.  Frail, elderly  HENT:  Head: Normocephalic and atraumatic.  Right Ear: External ear normal.  Left Ear: External ear normal.  Eyes: Conjunctivae and EOM are normal. Pupils are equal, round, and reactive to light.  Neck: Normal range of motion and phonation normal. Neck supple.  Cardiovascular: Normal rate, regular rhythm, normal heart sounds and intact distal pulses.   Pulmonary/Chest: Breath sounds normal. He is in respiratory distress. He has no wheezes. He has no rales. He exhibits no tenderness and no bony tenderness.  Abdominal: Soft. Normal appearance. There is no tenderness.  Musculoskeletal: Normal range of motion.  Neurological: He is alert and oriented to person, place, and time. He has normal strength. No cranial nerve deficit or sensory deficit. He exhibits  normal muscle tone. Coordination normal.  Skin: Skin is warm, dry and intact.  Psychiatric: He has a normal mood and affect. His behavior is normal. Judgment and thought content normal.  ED Course  Procedures (including critical care time)  Patient Vitals for the past 24 hrs:  BP Temp Temp src Pulse Resp SpO2  02/03/13 2330 135/95 mmHg - - 67 22 97 %  02/03/13 2300 150/97 mmHg - - 74 14 99 %  02/03/13 2115 122/82 mmHg - - 65 15 99 %  02/03/13 2015 110/68 mmHg - - 70 16 96 %  02/03/13 1945 120/75 mmHg - - 78 17 96 %  02/03/13 1915 130/78 mmHg - - 84 15 97 %  02/03/13 1854 122/72 mmHg 98.1 F (36.7 C) Oral 83 18 96 %      Date: 02/03/13  Rate: 86  Rhythm: normal sinus rhythm  QRS Axis: normal  PR and QT Intervals: normal  ST/T Wave abnormalities: nonspecific T wave changes  PR and QRS Conduction Disutrbances:none  Narrative Interpretation:   Old EKG Reviewed: unchanged  11:30 PM-Consult complete with Dr, Toniann Fail. Patient case explained and discussed. He agrees to admit patient for further evaluation and treatment. Call ended at 2338  Labs Reviewed  CBC - Abnormal; Notable for the following:    RBC 4.06 (*)    Hemoglobin 12.7 (*)    HCT 37.1 (*)    All other components within normal limits  BASIC METABOLIC PANEL - Abnormal; Notable for the following:    Glucose, Bld 154 (*)    GFR calc non Af Amer 65 (*)    GFR calc Af Amer 75 (*)    All other components within normal limits  POCT I-STAT TROPONIN I   Dg Chest 2 View  02/03/2013   *RADIOLOGY REPORT*  Clinical Data: Chest pain, shortness of breath  CHEST - 2 VIEW  Comparison: Chest x-ray of 06/07/2012  Findings: No active infiltrate or effusion is seen.  Mediastinal contours appear stable.  The heart is within normal limits in size. Median sternotomy sutures are noted from prior CABG.  No acute bony abnormality is seen.  IMPRESSION: No active lung disease.   Original Report Authenticated By: Dwyane Dee, M.D.   1. Chest  pain     MDM  Chest pain, nonspecific, but likely cardiac. Patient pain free in the ED. Patient's last cardiac catheterization in 2012 showed moderate disease of the native coronary vasculature and only one out of 3 grafts patent. Patient needs MI, rule out, in cardiology consultation with likely repeat cardiac testing with stress test and/or cardiac catheterization.   Nursing Notes Reviewed/ Care Coordinated, and agree without changes. Applicable Imaging Reviewed.  Interpretation of Laboratory Data incorporated into ED treatment   Plan: Admit  Flint Melter, MD 02/03/13 [phone]

## 2013-02-04 ENCOUNTER — Encounter (HOSPITAL_COMMUNITY): Payer: Self-pay | Admitting: Internal Medicine

## 2013-02-04 DIAGNOSIS — R079 Chest pain, unspecified: Secondary | ICD-10-CM

## 2013-02-04 DIAGNOSIS — I2510 Atherosclerotic heart disease of native coronary artery without angina pectoris: Secondary | ICD-10-CM

## 2013-02-04 DIAGNOSIS — E785 Hyperlipidemia, unspecified: Secondary | ICD-10-CM

## 2013-02-04 DIAGNOSIS — I739 Peripheral vascular disease, unspecified: Secondary | ICD-10-CM

## 2013-02-04 DIAGNOSIS — C61 Malignant neoplasm of prostate: Secondary | ICD-10-CM

## 2013-02-04 DIAGNOSIS — I10 Essential (primary) hypertension: Secondary | ICD-10-CM

## 2013-02-04 LAB — TROPONIN I: Troponin I: <0.3 ng/mL (ref <0.30)

## 2013-02-04 LAB — BASIC METABOLIC PANEL
CO2: 26 mEq/L (ref 19–32)
Chloride: 105 mEq/L (ref 96–112)
Creatinine, Ser: 0.98 mg/dL (ref 0.50–1.35)

## 2013-02-04 LAB — CBC
HCT: 37.7 % — ABNORMAL LOW (ref 39.0–52.0)
MCV: 90.8 fL (ref 78.0–100.0)
RDW: 13.6 % (ref 11.5–15.5)
WBC: 9.3 10*3/uL (ref 4.0–10.5)

## 2013-02-04 LAB — GLUCOSE, CAPILLARY: Glucose-Capillary: 82 mg/dL (ref 70–99)

## 2013-02-04 MED ORDER — PANTOPRAZOLE SODIUM 40 MG PO TBEC
40.0000 mg | DELAYED_RELEASE_TABLET | Freq: Two times a day (BID) | ORAL | Status: DC | PRN
  Administered 2013-02-06: 40 mg via ORAL
  Filled 2013-02-04 (×2): qty 1

## 2013-02-04 MED ORDER — NITROGLYCERIN 0.4 MG SL SUBL
0.4000 mg | SUBLINGUAL_TABLET | SUBLINGUAL | Status: DC | PRN
  Administered 2013-02-05: 0.4 mg via SUBLINGUAL

## 2013-02-04 MED ORDER — SODIUM CHLORIDE 0.9 % IV SOLN: INTRAVENOUS | Status: DC

## 2013-02-04 MED ORDER — ONDANSETRON HCL 4 MG/2ML IJ SOLN: 4.0000 mg | Freq: Four times a day (QID) | INTRAMUSCULAR | Status: DC | PRN

## 2013-02-04 MED ORDER — PREDNISONE 5 MG PO TABS
7.5000 mg | ORAL_TABLET | Freq: Every day | ORAL | Status: DC
  Administered 2013-02-04 – 2013-02-06 (×3): 7.5 mg via ORAL
  Filled 2013-02-04 (×3): qty 1

## 2013-02-04 MED ORDER — OXYBUTYNIN CHLORIDE 5 MG PO TABS
5.0000 mg | ORAL_TABLET | Freq: Two times a day (BID) | ORAL | Status: DC
  Administered 2013-02-04 – 2013-02-06 (×6): 5 mg via ORAL
  Filled 2013-02-04 (×7): qty 1

## 2013-02-04 MED ORDER — SIMVASTATIN 20 MG PO TABS
20.0000 mg | ORAL_TABLET | Freq: Every day | ORAL | Status: DC
  Administered 2013-02-04 – 2013-02-05 (×2): 20 mg via ORAL
  Filled 2013-02-04 (×3): qty 1

## 2013-02-04 MED ORDER — ENSURE COMPLETE PO LIQD
237.0000 mL | Freq: Two times a day (BID) | ORAL | Status: DC
  Administered 2013-02-04 – 2013-02-06 (×2): 237 mL via ORAL

## 2013-02-04 MED ORDER — VITAMIN D3 25 MCG (1000 UNIT) PO TABS
1000.0000 [IU] | ORAL_TABLET | Freq: Every day | ORAL | Status: DC
  Administered 2013-02-04: 1000 [IU] via ORAL
  Filled 2013-02-04 (×2): qty 1

## 2013-02-04 MED ORDER — METHOTREXATE 2.5 MG PO TABS: 7.5000 mg | ORAL_TABLET | ORAL | Status: DC

## 2013-02-04 MED ORDER — METOPROLOL TARTRATE 25 MG PO TABS
25.0000 mg | ORAL_TABLET | Freq: Two times a day (BID) | ORAL | Status: DC
  Administered 2013-02-04 – 2013-02-06 (×5): 25 mg via ORAL
  Filled 2013-02-04 (×7): qty 1

## 2013-02-04 MED ORDER — ONDANSETRON HCL 4 MG/2ML IJ SOLN: 4.0000 mg | Freq: Three times a day (TID) | INTRAMUSCULAR | Status: AC | PRN

## 2013-02-04 MED ORDER — ASPIRIN EC 325 MG PO TBEC
325.0000 mg | DELAYED_RELEASE_TABLET | Freq: Every day | ORAL | Status: DC
  Administered 2013-02-04: 325 mg via ORAL
  Filled 2013-02-04 (×2): qty 1

## 2013-02-04 MED ORDER — ENOXAPARIN SODIUM 40 MG/0.4ML ~~LOC~~ SOLN
40.0000 mg | SUBCUTANEOUS | Status: DC
  Administered 2013-02-04: 40 mg via SUBCUTANEOUS
  Filled 2013-02-04 (×2): qty 0.4

## 2013-02-04 MED ORDER — POLYVINYL ALCOHOL 1.4 % OP SOLN
1.0000 [drp] | Freq: Two times a day (BID) | OPHTHALMIC | Status: DC | PRN
  Administered 2013-02-04: 1 [drp] via OPHTHALMIC
  Filled 2013-02-04: qty 15

## 2013-02-04 MED ORDER — POLYETHYL GLYCOL-PROPYL GLYCOL 0.4-0.3 % OP SOLN: 1.0000 [drp] | Freq: Two times a day (BID) | OPHTHALMIC | Status: DC | PRN

## 2013-02-04 MED ORDER — REGADENOSON 0.4 MG/5ML IV SOLN
0.4000 mg | Freq: Once | INTRAVENOUS | Status: AC
  Administered 2013-02-05: 0.4 mg via INTRAVENOUS
  Filled 2013-02-04: qty 5

## 2013-02-04 MED ORDER — FOLIC ACID 1 MG PO TABS
1.0000 mg | ORAL_TABLET | ORAL | Status: DC
  Administered 2013-02-04: 1 mg via ORAL
  Filled 2013-02-04 (×2): qty 1

## 2013-02-04 MED ORDER — ONDANSETRON HCL 4 MG PO TABS: 4.0000 mg | ORAL_TABLET | Freq: Four times a day (QID) | ORAL | Status: DC | PRN

## 2013-02-04 MED ORDER — SODIUM CHLORIDE 0.9 % IJ SOLN
3.0000 mL | Freq: Two times a day (BID) | INTRAMUSCULAR | Status: DC
  Administered 2013-02-04: 02:00:00 via INTRAVENOUS
  Administered 2013-02-06: 3 mL via INTRAVENOUS

## 2013-02-04 MED ORDER — MIRTAZAPINE 15 MG PO TABS
15.0000 mg | ORAL_TABLET | Freq: Every day | ORAL | Status: DC
  Administered 2013-02-04 – 2013-02-05 (×3): 15 mg via ORAL
  Filled 2013-02-04 (×4): qty 1

## 2013-02-04 MED ORDER — TRAMADOL HCL 50 MG PO TABS: 100.0000 mg | ORAL_TABLET | Freq: Three times a day (TID) | ORAL | Status: DC | PRN

## 2013-02-04 NOTE — Care Management Note (Unsigned)
    Page 1 of 1   02/04/2013     4:07:10 PM   CARE MANAGEMENT NOTE 02/04/2013  Patient:  Tony Curtis, Tony Curtis   Account Number:  [id number]  Date Initiated:  02/04/2013  Documentation initiated by:  Jasalyn Frysinger  Subjective/Objective Assessment:   PT ADM ON 02/03/13 WITH CHEST PAIN.  PTA, PT INDEPENDENT, LIVES WITH SPOUSE.     Action/Plan:   WILL FOLLOW FOR HOME NEEDS AS PT PROGRESSES.   Anticipated DC Date:  02/05/2013   Anticipated DC Plan:  HOME/SELF CARE      DC Planning Services  CM consult      Choice offered to / List presented to:             Status of service:  In process, will continue to follow Medicare Important Message given?   (If response is "NO", the following Medicare IM given date fields will be blank) Date Medicare IM given:   Date Additional Medicare IM given:    Discharge Disposition:    Per UR Regulation:  Reviewed for med. necessity/level of care/duration of stay  If discussed at Long Length of Stay Meetings, dates discussed:    Comments:

## 2013-02-04 NOTE — Clinical Documentation Improvement (Signed)
[facility] DOCUMENTATION CLARIFICATION  THIS DOCUMENT IS NOT A PERMANENT PART OF THE MEDICAL RECORD  TO RESPOND TO THE THIS QUERY, FOLLOW THE INSTRUCTIONS BELOW:  1. If needed, update documentation for the patient's encounter via the notes activity.  2. Access this query again and click edit on the In [employer].  3. After updating, or not, click F2 to complete all highlighted (required) fields concerning your review. Select "additional documentation in the medical record" OR "no additional documentation provided".  4. Click Sign note button.  5. The deficiency will fall out of your In Basket *Please let us know if you are not able to complete this workflow by phone or e-mail (listed below).  Please update your documentation within the medical record to reflect your response to this query.                                                                                        02/04/13   Dear Dr.Thompson / Associates,  In a better effort to capture your patient's severity of illness, reflect appropriate length of stay and utilization of resources, a review of the patient medical record has revealed the following indicators.    Based on your clinical judgment, please clarify and document in a progress note and/or discharge summary the clinical condition associated with the following supporting information:  In responding to this query please exercise your independent judgment.  The fact that a query is asked, does not imply that any particular answer is desired or expected.   Possible Clinical Conditions?  _______Moderate Malnutrition _______Severe Malnutrition   _______Protein Calorie Malnutrition _______Severe Protein Calorie Malnutrition _______Emaciation  _______Cachexia   _______Other Condition _______Cannot clinically determine      Risk Factors: Patient described as "frail" per 6/23 progress notes.   Signs & Symptoms: Ht: 5'10"      Wt: 117 lb 1 oz  BMI:   16.8     You may use possible, probable, or suspect with inpatient documentation. possible, probable, suspected diagnoses MUST be documented at the time of discharge  Reviewed: Additional documentation provided per 02/10/13 discharge summary.                Thank You,  Marciano Sequin,  Clinical Documentation Specialist:  Phone: [phone]  Health Information Management [place]

## 2013-02-04 NOTE — Progress Notes (Addendum)
INITIAL NUTRITION ASSESSMENT  DOCUMENTATION CODES Per approved criteria  -Severe malnutrition in the context of chronic illness -Underweight   INTERVENTION:  Ensure Complete twice daily between meals (350 kcals, 13 gm protein per 8 fl oz bottle) RD to follow for nutrition care plan  NUTRITION DIAGNOSIS: Increased nutrient needs related to malnutrition as evidenced by estimated nutrition needs  Goal: Oral intake with meals & supplements to meet >/= 90% of estimated nutrition needs  Monitor:  PO & supplemental intake, weight, labs, I/O's  Reason for Assessment: Health Hx  77 y.o. male  Admitting Dx: Chest pain  ASSESSMENT: Patient with history of CAD s/p CABG, arthritis on methotrexate and steroids, peripheral vascular disease, hyperlipidemia, CA prostate, hypertension, hyperlipidemia presented to the ER with chest pain; pain was retrosternal and sometime radiating to the back ---> admitted for further management.  Patient reports his appetite has been better recently; he states he's been drinking Boost since his "surgery" back in 2009 which the [facility] has provided for him; he also reports he had lost some weight in the past but has been gaining it back; no % PO intake recorded in flowsheet records; amenable to Ensure supplements ---> RD to order.  Patient meets criteria for severe protein calorie malnutrition in the context of chronic illness given severe muscle (clavices, shoulders, calves, quadriceps) and subcutaneous fat loss (biceps, triceps).  Height: Ht Readings from Last 1 Encounters:  02/04/13 5\' 10"  (1.778 m)    Weight: Wt Readings from Last 1 Encounters:  02/04/13 117 lb 1 oz (53.1 kg)    Ideal Body Weight: 166 lb  % Ideal Body Weight: 70%  Wt Readings from Last 10 Encounters:  02/04/13 117 lb 1 oz (53.1 kg)  07/04/11 118 lb (53.524 kg)  07/04/11 118 lb (53.524 kg)  07/04/11 118 lb (53.524 kg)    Usual Body Weight: 118 lb  %  Usual Body Weight: 99%  BMI:  Body mass index is 16.8 kg/(m^2).  Estimated Nutritional Needs: Kcal: 1600-1800 Protein: 70-80 gm Fluid: 1.6-1.8 L  Skin: Intact  Diet Order: Cardiac  EDUCATION NEEDS: -No education needs identified at this time   Intake/Output Summary (Last 24 hours) at 02/04/13 1522 Last data filed at 02/04/13 0157  Gross per 24 hour  Intake      3 ml  Output      0 ml  Net      3 ml    Labs:   Recent Labs Lab 02/03/13 1929 02/04/13 0229  NA 138 140  K 4.3 4.0  CL 105 105  CO2 25 26  BUN 17 16  CREATININE 1.09 0.98  CALCIUM 8.7 8.7  GLUCOSE 154* 92    CBG (last 3)   Recent Labs  02/04/13 0116 02/04/13 0532 02/04/13 0757  GLUCAP 147* 74 82    Scheduled Meds: . aspirin EC  325 mg Oral Daily  . cholecalciferol  1,000 Units Oral Daily  . enoxaparin (LOVENOX) injection  40 mg Subcutaneous Q24H  . folic acid  1 mg Oral Custom  . [START ON 02/07/2013] methotrexate  7.5 mg Oral Q Fri  . metoprolol  25 mg Oral BID  . mirtazapine  15 mg Oral QHS  . oxybutynin  5 mg Oral BID  . predniSONE  7.5 mg Oral Daily  . [START ON 02/05/2013] regadenoson  0.4 mg Intravenous Once  . simvastatin  20 mg Oral q1800  . sodium chloride  3 mL Intravenous Q12H    Continuous Infusions: .  sodium chloride      Past Medical History  Diagnosis Date  . HTN (hypertension)   . Hyperlipemia   . CAD (coronary artery disease)     last nuc 02/24/11- no ischemia, small, fixed distal anterior, apicla anterior defect (scar); last cath 07/04/11-1/3 grafts patent, occluded vein to ramus and diag branches  . Prostate cancer   . PVD (peripheral vascular disease)     LE dopp (10/19/11) patent R SFA stent, R ant tib occluded with reconstitution within the dorsalis pedis, L SFA 70-99%, L ant tib occluded with reconstitution of flow in the dorsalis pedis, bil ABI 1.2; carotid dopp (04-27-08)-normal  . H/O echocardiogram 07/08/2008    EF 40-45%, mod-severe inferior wall  hypokinesis; no sign valvular disease  . Myocardial infarction   . Anginal pain   . Shortness of breath   . GERD (gastroesophageal reflux disease)     Past Surgical History  Procedure Laterality Date  . History of prostatectomy.    . Right knee surgery for dislocation.    . Cabg in 2009.  05/06/2008    x3, LIMA to LAD, SVG to diad, SVG to ramus inermediate  . Cataract surgery.    . Cardiac catheterization  07/04/2011    patent LIMA to diag branch, occluded ben grafts to diag and ramus branch; EF >60%   . Cardiac catheterization  03/20/2008    occluded prox LAD with great collaterals from the ramus and the circ and RCA to LAD; EF 45-50%, med tx  . Abdominal angiogram  03/21/11    angiosculpt curring blloon atherectomy, PTA and stentin of distal R SFA  . Abdominal angiogram  10/13/08    PTA and angioacoring with 5x35mm AngioSculpt balloon of the L distal SFA and L pop artery  . Abdominal angiogram  07/28/2008    PTA and balloon angioplasty with scoring balloon angioplasty with angioscope of the R distal SFA and R pop artery  . Coronary artery bypass graft      Maureen Chatters, RD, LDN Pager #: [phone] After-Hours Pager #: [phone]

## 2013-02-04 NOTE — Consult Note (Signed)
Reason for Consult: Chest Pain Referring Physician: TRH  HPI: The patient is a 77 y/o AAM, followed at [facility] by Dr. [name]. He has known CAD, s/p CABG x 3 in 2009. He received a LIMA to LAD, a vein graft to the diagonal branch and a vein graft to the ramus branch. A repeat cardiac cath in 2012 revealed a patent LIMA graft, but both vein grafts were found to be occluded. He also has peripheral vascular disease, s/p PTA and stenting of his right SFA in 2012, as well as hypertension and hyperlipidemia. He presented to [facility] on 6/23 with a complaint of intermittent substernal chest pain, over the last several days, that was worse yesterday evening. The pain is similar to his angina before is CABG. The pain has become more frequent and wakes him from his sleep.  It radiates to the left jaw back and bilateral scapula. The pain is non-exertional. Last PM, the pain was 10/10. It was responsive to SL NTG. He denies any other associated symptoms.    Past Medical History  Diagnosis Date  . HTN (hypertension)   . Hyperlipemia   . CAD (coronary artery disease)     last nuc 02/24/11- no ischemia, small, fixed distal anterior, apicla anterior defect (scar); last cath 07/04/11-1/3 grafts patent, occluded vein to ramus and diag branches  . Prostate cancer   . PVD (peripheral vascular disease)     LE dopp (10/19/11) patent R SFA stent, R ant tib occluded with reconstitution within the dorsalis pedis, L SFA 70-99%, L ant tib occluded with reconstitution of flow in the dorsalis pedis, bil ABI 1.2; carotid dopp (04-27-08)-normal  . H/O echocardiogram 07/08/2008    EF 40-45%, mod-severe inferior wall hypokinesis; no sign valvular disease    Past Surgical History  Procedure Laterality Date  . History of prostatectomy.    . Right knee surgery for dislocation.    . Cabg in 2009.  05/06/2008    x3, LIMA to LAD, SVG to diad, SVG to ramus inermediate  . Cataract surgery.    . Cardiac catheterization  07/04/2011    patent LIMA  to diag branch, occluded ben grafts to diag and ramus branch; EF >60%   . Cardiac catheterization  03/20/2008    occluded prox LAD with great collaterals from the ramus and the circ and RCA to LAD; EF 45-50%, med tx  . Abdominal angiogram  03/21/11    angiosculpt curring blloon atherectomy, PTA and stentin of distal R SFA  . Abdominal angiogram  10/13/08    PTA and angioacoring with 5x72mm AngioSculpt balloon of the L distal SFA and L pop artery  . Abdominal angiogram  07/28/2008    PTA and balloon angioplasty with scoring balloon angioplasty with angioscope of the R distal SFA and R pop artery    Family History:  Family History  Problem Relation Age of Onset  . Adopted: Yes  2 sisters with no known CAD, no DM or HTN   Social History:  reports that he quit smoking about 33 years ago. He has never used smokeless tobacco. He reports that  drinks alcohol. He reports that he does not use illicit drugs.  Allergies:  Allergies  Allergen Reactions  . Acetaminophen Rash    Medications:  Prior to Admission:  Prescriptions prior to admission  Medication Sig Dispense Refill  . aspirin EC 81 MG tablet Take 81 mg by mouth daily.      . cholecalciferol (VITAMIN D) 1000 UNITS tablet Take 1,000  Units by mouth daily.      . folic acid (FOLVITE) 1 MG tablet Take 1 mg by mouth See admin instructions. Take on every day except Fridays (don't take with methotrexate)      . methotrexate (RHEUMATREX) 2.5 MG tablet Take 7.5 mg by mouth once a week. Caution:Chemotherapy. Protect from light. Take on Fridays      . metoprolol (LOPRESSOR) 50 MG tablet Take 25 mg by mouth 2 (two) times daily.      . mirtazapine (REMERON) 15 MG tablet Take 15 mg by mouth at bedtime.      . nitroGLYCERIN (NITROSTAT) 0.4 MG SL tablet Place 0.4 mg under the tongue every 5 (five) minutes as needed for chest pain.      Marland Kitchen oxybutynin (DITROPAN) 5 MG tablet Take 5 mg by mouth 2 (two) times daily.       . pantoprazole (PROTONIX) 40 MG  tablet Take 40 mg by mouth 2 (two) times daily as needed (heartburn).       Bertram Gala Glycol-Propyl Glycol (SYSTANE) 0.4-0.3 % SOLN Place 1 drop into both eyes 2 (two) times daily as needed (dry eyes).       . pravastatin (PRAVACHOL) 40 MG tablet Take 40 mg by mouth at bedtime.      . predniSONE (DELTASONE) 5 MG tablet Take 7.5 mg by mouth daily. Takes with milk      . PRESCRIPTION MEDICATION Apply 1 application topically at bedtime as needed (for back and neck pain). Medicated cream from [place]      . traMADol (ULTRAM) 50 MG tablet Take 100 mg by mouth 3 (three) times daily as needed for pain. Maximum dose= 8 tablets per day        Results for orders placed during the hospital encounter of 02/03/13 (from the past 48 hour(s))  CBC     Status: Abnormal   Collection Time    02/03/13  7:29 PM      Result Value Range   WBC 9.5  4.0 - 10.5 K/uL   RBC 4.06 (*) 4.22 - 5.81 MIL/uL   Hemoglobin 12.7 (*) 13.0 - 17.0 g/dL   HCT 16.1 (*) 09.6 - 04.5 %   MCV 91.4  78.0 - 100.0 fL   MCH 31.3  26.0 - 34.0 pg   MCHC 34.2  30.0 - 36.0 g/dL   RDW 40.9  81.1 - 91.4 %   Platelets 196  150 - 400 K/uL  BASIC METABOLIC PANEL     Status: Abnormal   Collection Time    02/03/13  7:29 PM      Result Value Range   Sodium 138  135 - 145 mEq/L   Potassium 4.3  3.5 - 5.1 mEq/L   Chloride 105  96 - 112 mEq/L   CO2 25  19 - 32 mEq/L   Glucose, Bld 154 (*) 70 - 99 mg/dL   BUN 17  6 - 23 mg/dL   Creatinine, Ser 7.82  0.50 - 1.35 mg/dL   Calcium 8.7  8.4 - 95.6 mg/dL   GFR calc non Af Amer 65 (*) >90 mL/min   GFR calc Af Amer 75 (*) >90 mL/min   Comment:            The eGFR has been calculated     using the CKD EPI equation.     This calculation has not been     validated in all clinical     situations.  eGFR's persistently     <90 mL/min signify     possible Chronic Kidney Disease.  POCT I-STAT TROPONIN I     Status: None   Collection Time    02/03/13  7:41 PM      Result Value Range   Troponin i, poc  0.01  0.00 - 0.08 ng/mL   Comment 3            Comment: Due to the release kinetics of cTnI,     a negative result within the first hours     of the onset of symptoms does not rule out     myocardial infarction with certainty.     If myocardial infarction is still suspected,     repeat the test at appropriate intervals.  GLUCOSE, CAPILLARY     Status: Abnormal   Collection Time    02/04/13  1:16 AM      Result Value Range   Glucose-Capillary 147 (*) 70 - 99 mg/dL  TROPONIN I     Status: None   Collection Time    02/04/13  2:29 AM      Result Value Range   Troponin I <0.30  <0.30 ng/mL   Comment:            Due to the release kinetics of cTnI,     a negative result within the first hours     of the onset of symptoms does not rule out     myocardial infarction with certainty.     If myocardial infarction is still suspected,     repeat the test at appropriate intervals.  BASIC METABOLIC PANEL     Status: Abnormal   Collection Time    02/04/13  2:29 AM      Result Value Range   Sodium 140  135 - 145 mEq/L   Potassium 4.0  3.5 - 5.1 mEq/L   Chloride 105  96 - 112 mEq/L   CO2 26  19 - 32 mEq/L   Glucose, Bld 92  70 - 99 mg/dL   BUN 16  6 - 23 mg/dL   Creatinine, Ser 1.61  0.50 - 1.35 mg/dL   Calcium 8.7  8.4 - 09.6 mg/dL   GFR calc non Af Amer 79 (*) >90 mL/min   GFR calc Af Amer >90  >90 mL/min   Comment:            The eGFR has been calculated     using the CKD EPI equation.     This calculation has not been     validated in all clinical     situations.     eGFR's persistently     <90 mL/min signify     possible Chronic Kidney Disease.  CBC     Status: Abnormal   Collection Time    02/04/13  2:29 AM      Result Value Range   WBC 9.3  4.0 - 10.5 K/uL   RBC 4.15 (*) 4.22 - 5.81 MIL/uL   Hemoglobin 12.6 (*) 13.0 - 17.0 g/dL   HCT 04.5 (*) 40.9 - 81.1 %   MCV 90.8  78.0 - 100.0 fL   MCH 30.4  26.0 - 34.0 pg   MCHC 33.4  30.0 - 36.0 g/dL   RDW 91.4  78.2 - 95.6 %    Platelets 204  150 - 400 K/uL  GLUCOSE, CAPILLARY     Status: None   Collection Time  02/04/13  5:32 AM      Result Value Range   Glucose-Capillary 74  70 - 99 mg/dL  TROPONIN I     Status: None   Collection Time    02/04/13  6:52 AM      Result Value Range   Troponin I <0.30  <0.30 ng/mL   Comment:            Due to the release kinetics of cTnI,     a negative result within the first hours     of the onset of symptoms does not rule out     myocardial infarction with certainty.     If myocardial infarction is still suspected,     repeat the test at appropriate intervals.  GLUCOSE, CAPILLARY     Status: None   Collection Time    02/04/13  7:57 AM      Result Value Range   Glucose-Capillary 82  70 - 99 mg/dL   Comment 1 Notify RN     Comment 2 Documented in Chart      Dg Chest 2 View  02/03/2013   *RADIOLOGY REPORT*  Clinical Data: Chest pain, shortness of breath  CHEST - 2 VIEW  Comparison: Chest x-ray of 06/07/2012  Findings: No active infiltrate or effusion is seen.  Mediastinal contours appear stable.  The heart is within normal limits in size. Median sternotomy sutures are noted from prior CABG.  No acute bony abnormality is seen.  IMPRESSION: No active lung disease.   Original Report Authenticated By: Dwyane Dee, M.D.    Review of Systems  Respiratory: Negative for shortness of breath.   Cardiovascular: Positive for chest pain.  All other systems reviewed and are negative.   Blood pressure 142/86, pulse 70, temperature 97.4 F (36.3 C), temperature source Oral, resp. rate 18, height 5\' 10"  (1.778 m), weight 117 lb 1 oz (53.1 kg), SpO2 98.00%. Physical Exam  Constitutional: He is oriented to person, place, and time. No distress.  Thin appearing   Neck: No JVD present.  Cardiovascular: Normal rate, regular rhythm, normal heart sounds and intact distal pulses.  Exam reveals no gallop and no friction rub.   No murmur heard. Respiratory: Effort normal and breath sounds  normal. No respiratory distress. He has no wheezes. He has no rales. He exhibits no tenderness.  GI: Soft. Bowel sounds are normal. He exhibits no distension and no mass. There is no tenderness.  Musculoskeletal: He exhibits no edema.  Neurological: He is alert and oriented to person, place, and time.  Skin: Rash noted. He is not diaphoretic.  Maculopapular rash over anterior chest  Psychiatric: He has a normal mood and affect. His behavior is normal.    Assessment/Plan: Principal Problem:   Chest pain Active Problems:   HTN (hypertension)   Hyperlipemia   CAD (coronary artery disease)   Prostate cancer   PVD (peripheral vascular disease)  Plan: 77 y/o male with known CAD, history of CABG x 3 with only a patent LIMA-LAD remaining on last cath in 2012, PVD, HLD, and HTN, who presents with substernal chest pain. Cardiac enzymes negative x 2. EKG is unchanged from prior EKG. CXR normal. He has no chest pain currently. ? NST to evaluate for ischemia. Continue on BB and statin. ? adding a long acting nitrate. Pt reports an allergy to ASA and has a maculopapular rash on anterior chest. Will need an alternative.   Allayne Butcher, PA-C  02/04/2013, 10:43 AM

## 2013-02-04 NOTE — Consult Note (Addendum)
Pt. Seen and examined. Agree with the NP/PA-C note as written.  Pleasant 77 yo male ex[employer] veteran with a history of CAD s/p CABG in 2009. He underwent outpatient cath by Dr. Lynnea Ferrier which demonstrated a proximally occluded LAD, 50% ramus disease and collaterals from the RCA and LCx to the LAD. He underwent CABG and ultimately received a LIMA-LAD, SVG to RAMUS and SVG to PDA. He had recurrent chest pain and pain to his shoulder blades and underwent cath in 2012 which revealed both SVG's were occluded, but no significant disease was noted in the RCA, LCx or Ramus branches. The LIMA was patent and not atretic. EF had improved from 35-40% up to 60% at that time. He had an NST in 2012, which revealed a possible apical scar, no ischemia and preserved EF.  He now presents with progressive chest pain, fatigue and weakness, including pain to his shoulder blades, which is present at rest, with exertion and often wakes him up at night. It is not significantly improved with nitroglycerin or tramadol for that matter. The symptoms are reminiscent of his symptoms before bypass. Of note, he has a history of prostate CA in the past and underwent radical prostatectomy and lymph node dissection in 2002.   He has had mild weight loss and marginal appetite for some time. Troponin is negative x 2. 12-lead EKG is pending. CXR is unremarkable.  Impression: 1. Chest pain, possibly unstable angina 2. History of prostate CA 3. Fatigue, progressive weakness 4. PAD with bilateral SFA stents in the past, no claudication  Plan: 1. He has ruled-out for AMI and is chest pain free. His symptoms have some typical and atypical features. Actually, they are similar to what he described in 2012 and cath and NST failed to show ischemia.  He thinks the symptoms are worse, along with his exertional fatigue.  Plan repeat NST in the am tomorrow. Check PSA given history of prostate CA. Obtain formal 12-lead EKG. Of note, he is on methotrexate and  maintenance prednisone, presumably for rheumatoid arthritis? Continue these.   We will be happy to assume care of Mr. Roselle on our service. Thanks to [facility] for admitting.  Chrystie Nose, MD, [facility] Attending Cardiologist [facility]

## 2013-02-04 NOTE — Clinical Documentation Improvement (Signed)
CHEST PAIN DOCUMENTATION CLARIFICATION QUERY  THIS DOCUMENT IS NOT A PERMANENT PART OF THE MEDICAL RECORD  TO RESPOND TO THE THIS QUERY, FOLLOW THE INSTRUCTIONS BELOW:  1. If needed, update documentation for the patient's encounter via the notes activity.  2. Access this query again and click edit on the In [employer].  3. After updating, or not, click F2 to complete all highlighted (required) fields concerning your review. Select "additional documentation in the medical record" OR "no additional documentation provided".  4. Click Sign note button.  5. The deficiency will fall out of your In Basket *Please let us know if you are not able to complete this workflow by phone or e-mail (listed below).          02/04/13  Dear Dr. Janee Morn Marton Redwood  In an effort to better capture your patient's severity of illness, reflect appropriate length of stay and utilization of resources, a review of the patient medical record has revealed the following indicators.    Based on your clinical judgment, please clarify and document in a progress note and/or discharge summary the clinical condition associated with the following supporting information:  In responding to this query please exercise your independent judgment.  The fact that a query is asked, does not imply that any particular answer is desired or expected.    Possible Clinical Conditions?   Based on your clinical judgment, please clarify cause of Chest Pain:  _______Acute MI  _______Unstable Angina   _______Musculoskeletal Chest Pain  _______Pleuritic Chest Pain  _______GERD  _______Costochondritis  _______Other Condition  _______Cannot Clinically Determine        Risk Factors: Admitted with Chest pain.     You may use possible, probable, or suspect with inpatient documentation. possible, probable, suspected diagnoses MUST be documented at the time of discharge  Reviewed:  Additional documentation  provided per 02/10/13 discharge summary.                          Thank You,  Marciano Sequin,  Clinical Documentation Specialist:  Phone: [phone]  Health Information Management [place]

## 2013-02-04 NOTE — Progress Notes (Signed)
Pt Tx from ED. No family present, v/s stable placed on teley box # 2023 NSR confirmed by cardiac cental monitor tech. No c/o pain skin dry and intact will monitor.

## 2013-02-04 NOTE — Progress Notes (Signed)
I have seen and assessed patient and agree with Dr Katherene Ponto assessment and plan. Will consult with cardiology for further evaluation and rxcs.

## 2013-02-04 NOTE — ED Notes (Signed)
Attempted to give report 

## 2013-02-04 NOTE — H&P (Signed)
[facility] History and Physical  Tony Curtis EAV:[id number] DOB: Nov 01, 1935 DOA: 02/03/2013  Referring physician:ER Physcian. PCP: [name]., MD  Specialists: [facility].  Chief Complaint: Chest Pain.  HPI: Tony Curtis is a 77 y.o. male with known history of CAD S/P CABG, arthritis on methotrexate and steroids, peripheral vascular disease, hyperlipidemia, CA prostate, hypertension, hyperlipidemia presented to the ER with chest pain. Patient has been experiencing chest pain for last many days but last evening he states round 6 PM it was more than usual. The pain was retrosternal and sometime radiating to the back. Denies any associated shortness of breath fever chills productive cough. He states he may have had mild diaphoresis. EMS gave him sublingual NTG after which his chest pain has subsided. Cardiac mrakers EKG and CXR were unremarkable. Patient at this time is chest pain and will be admitted for further management.   Review of Systems: As presented in the history of presenting illness, rest negative.  Past Medical History  Diagnosis Date  . HTN (hypertension)   . Hyperlipemia   . CAD (coronary artery disease)     last nuc 02/24/11- no ischemia, small, fixed distal anterior, apicla anterior defect (scar); last cath 07/04/11-1/3 grafts patent, occluded vein to ramus and diag branches  . Prostate cancer   . PVD (peripheral vascular disease)     LE dopp (10/19/11) patent R SFA stent, R ant tib occluded with reconstitution within the dorsalis pedis, L SFA 70-99%, L ant tib occluded with reconstitution of flow in the dorsalis pedis, bil ABI 1.2; carotid dopp (04-27-08)-normal  . H/O echocardiogram 07/08/2008    EF 40-45%, mod-severe inferior wall hypokinesis; no sign valvular disease   Past Surgical History  Procedure Laterality Date  . History of prostatectomy.    . Right knee surgery for dislocation.    . Cabg in 2009.  05/06/2008    x3, LIMA  to LAD, SVG to diad, SVG to ramus inermediate  . Cataract surgery.    . Cardiac catheterization  07/04/2011    patent LIMA to diag branch, occluded ben grafts to diag and ramus branch; EF >60%   . Cardiac catheterization  03/20/2008    occluded prox LAD with great collaterals from the ramus and the circ and RCA to LAD; EF 45-50%, med tx  . Abdominal angiogram  03/21/11    angiosculpt curring blloon atherectomy, PTA and stentin of distal R SFA  . Abdominal angiogram  10/13/08    PTA and angioacoring with 5x25mm AngioSculpt balloon of the L distal SFA and L pop artery  . Abdominal angiogram  07/28/2008    PTA and balloon angioplasty with scoring balloon angioplasty with angioscope of the R distal SFA and R pop artery   Social History:  reports that he quit smoking about 33 years ago. He has never used smokeless tobacco. He reports that  drinks alcohol. He reports that he does not use illicit drugs. Home. where does patient live-- Can do ADL's Can patient participate in ADLs?  Allergies  Allergen Reactions  . Acetaminophen Rash    History reviewed. No pertinent family history.    Prior to Admission medications   Medication Sig Start Date End Date Taking? Authorizing Provider  aspirin EC 81 MG tablet Take 81 mg by mouth daily.   Yes Historical Provider, MD  cholecalciferol (VITAMIN D) 1000 UNITS tablet Take 1,000 Units by mouth daily.   Yes Historical Provider, MD  folic acid (FOLVITE) 1 MG tablet  Take 1 mg by mouth See admin instructions. Take on every day except Fridays (don't take with methotrexate)   Yes Historical Provider, MD  methotrexate (RHEUMATREX) 2.5 MG tablet Take 7.5 mg by mouth once a week. Caution:Chemotherapy. Protect from light. Take on Fridays   Yes Historical Provider, MD  metoprolol (LOPRESSOR) 50 MG tablet Take 25 mg by mouth 2 (two) times daily.   Yes Historical Provider, MD  mirtazapine (REMERON) 15 MG tablet Take 15 mg by mouth at bedtime.   Yes Historical Provider,  MD  nitroGLYCERIN (NITROSTAT) 0.4 MG SL tablet Place 0.4 mg under the tongue every 5 (five) minutes as needed for chest pain.   Yes Historical Provider, MD  oxybutynin (DITROPAN) 5 MG tablet Take 5 mg by mouth 2 (two) times daily.    Yes Historical Provider, MD  pantoprazole (PROTONIX) 40 MG tablet Take 40 mg by mouth 2 (two) times daily as needed (heartburn).    Yes Historical Provider, MD  Polyethyl Glycol-Propyl Glycol (SYSTANE) 0.4-0.3 % SOLN Place 1 drop into both eyes 2 (two) times daily as needed (dry eyes).    Yes Historical Provider, MD  pravastatin (PRAVACHOL) 40 MG tablet Take 40 mg by mouth at bedtime.   Yes Historical Provider, MD  predniSONE (DELTASONE) 5 MG tablet Take 7.5 mg by mouth daily. Takes with milk   Yes Historical Provider, MD  PRESCRIPTION MEDICATION Apply 1 application topically at bedtime as needed (for back and neck pain). Medicated cream from [place]   Yes Historical Provider, MD  traMADol (ULTRAM) 50 MG tablet Take 100 mg by mouth 3 (three) times daily as needed for pain. Maximum dose= 8 tablets per day   Yes Historical Provider, MD   Physical Exam: Filed Vitals:   02/03/13 2300 02/03/13 2330 02/03/13 2351 02/04/13 0030  BP: 150/97 135/95 138/80 143/95  Pulse: 74 67  66  Temp:   97.9 F (36.6 C)   TempSrc:   Oral   Resp: 14 22 24 23   SpO2: 99% 97% 97% 97%     General:  Well developed and moderately nourished.  Eyes: anicteric no pallor.  ENT: no discharge from the ears eyes nose or mouth.  Neck: no mass felt.  Cardiovascular: S1S2 heard.  Respiratory: no rhonchi or crepitations.  Abdomen: soft non tender. Bowel sounds present.  Skin: no rash.  Musculoskeletal: no edema.  Psychiatric: appears normal.  Neurologic: alert awake oriented to time place and person. Moves all extremities.  Labs on Admission:  Basic Metabolic Panel:  Recent Labs Lab 02/03/13 1929  NA 138  K 4.3  CL 105  CO2 25  GLUCOSE 154*  BUN 17  CREATININE 1.09  CALCIUM  8.7   Liver Function Tests: No results found for this basename: AST, ALT, ALKPHOS, BILITOT, PROT, ALBUMIN,  in the last 168 hours No results found for this basename: LIPASE, AMYLASE,  in the last 168 hours No results found for this basename: AMMONIA,  in the last 168 hours CBC:  Recent Labs Lab 02/03/13 1929  WBC 9.5  HGB 12.7*  HCT 37.1*  MCV 91.4  PLT 196   Cardiac Enzymes: No results found for this basename: CKTOTAL, CKMB, CKMBINDEX, TROPONINI,  in the last 168 hours  BNP (last 3 results) No results found for this basename: PROBNP,  in the last 8760 hours CBG: No results found for this basename: GLUCAP,  in the last 168 hours  Radiological Exams on Admission: Dg Chest 2 View  02/03/2013   *RADIOLOGY  REPORT*  Clinical Data: Chest pain, shortness of breath  CHEST - 2 VIEW  Comparison: Chest x-ray of 06/07/2012  Findings: No active infiltrate or effusion is seen.  Mediastinal contours appear stable.  The heart is within normal limits in size. Median sternotomy sutures are noted from prior CABG.  No acute bony abnormality is seen.  IMPRESSION: No active lung disease.   Original Report Authenticated By: Dwyane Dee, M.D.     Assessment/Plan Principal Problem:   Chest pain Active Problems:   HTN (hypertension)   Hyperlipemia   CAD (coronary artery disease)   Prostate cancer   PVD (peripheral vascular disease)   1. Chest pain with history of CAD - presently chest pain free. Cycle cardia markers. ASA. Consult [facility] in AM. 2. Arthritis - continue present medications. 3. HTN - continue current medications. 4. Hyperlipidemia - continue present medications. 5. Peripheral vascular disease - no acute issues. 6. History of CA prostate.    Code Status: full code.  Family Communication: none.  Disposition Plan: admit.    Amity Roes N. [facility] Pager [phone].  If 7PM-7AM, please contact night-coverage [email or web address] Password [facility] 02/04/2013, 12:49  AM

## 2013-02-05 ENCOUNTER — Encounter (HOSPITAL_COMMUNITY): Admission: EM | Disposition: A | Discharge status: Home / Self Care | Payer: Self-pay | Source: Home / Self Care

## 2013-02-05 ENCOUNTER — Observation Stay (HOSPITAL_COMMUNITY): Payer: Medicare Other

## 2013-02-05 DIAGNOSIS — I2510 Atherosclerotic heart disease of native coronary artery without angina pectoris: Secondary | ICD-10-CM

## 2013-02-05 DIAGNOSIS — E43 Unspecified severe protein-calorie malnutrition: Secondary | ICD-10-CM | POA: Insufficient documentation

## 2013-02-05 HISTORY — PX: LEFT HEART CATHETERIZATION WITH CORONARY/GRAFT ANGIOGRAM: SHX5450

## 2013-02-05 HISTORY — PX: LEFT HEART CATHETERIZATION WITH CORONARY ANGIOGRAM: SHX5451

## 2013-02-05 HISTORY — PX: CARDIAC CATHETERIZATION: SHX172

## 2013-02-05 LAB — LIPID PANEL
LDL Cholesterol: 80 mg/dL (ref 0–99)
Total CHOL/HDL Ratio: 2.6 RATIO
VLDL: 14 mg/dL (ref 0–40)

## 2013-02-05 LAB — BASIC METABOLIC PANEL
BUN: 15 mg/dL (ref 6–23)
CO2: 26 mEq/L (ref 19–32)
Chloride: 107 mEq/L (ref 96–112)
Creatinine, Ser: 1.13 mg/dL (ref 0.50–1.35)

## 2013-02-05 LAB — TROPONIN I
Troponin I: <0.3 ng/mL (ref <0.30)
Troponin I: <0.3 ng/mL (ref <0.30)

## 2013-02-05 LAB — PSA: PSA: <0.01 ng/mL — ABNORMAL LOW (ref <=4.00)

## 2013-02-05 SURGERY — LEFT HEART CATHETERIZATION WITH CORONARY/GRAFT ANGIOGRAM

## 2013-02-05 SURGERY — LEFT HEART CATHETERIZATION WITH CORONARY ANGIOGRAM
Anesthesia: LOCAL

## 2013-02-05 MED ORDER — ASPIRIN 81 MG PO CHEW
81.0000 mg | CHEWABLE_TABLET | Freq: Every day | ORAL | Status: DC
  Administered 2013-02-06: 81 mg via ORAL
  Filled 2013-02-05: qty 1

## 2013-02-05 MED ORDER — TECHNETIUM TC 99M SESTAMIBI GENERIC - CARDIOLITE
10.0000 | Freq: Once | INTRAVENOUS | Status: AC | PRN
  Administered 2013-02-05: 10 via INTRAVENOUS

## 2013-02-05 MED ORDER — REGADENOSON 0.4 MG/5ML IV SOLN
INTRAVENOUS | Status: AC
  Filled 2013-02-05: qty 5

## 2013-02-05 MED ORDER — SODIUM CHLORIDE 0.9 % IV SOLN: INTRAVENOUS | Status: AC

## 2013-02-05 MED ORDER — NITROGLYCERIN 0.4 MG SL SUBL
SUBLINGUAL_TABLET | SUBLINGUAL | Status: AC
  Filled 2013-02-05: qty 25

## 2013-02-05 MED ORDER — NITROGLYCERIN IN D5W 200-5 MCG/ML-% IV SOLN
2.0000 ug/min | INTRAVENOUS | Status: DC
  Filled 2013-02-05: qty 250

## 2013-02-05 MED ORDER — FENTANYL CITRATE 0.05 MG/ML IJ SOLN
INTRAMUSCULAR | Status: AC
  Filled 2013-02-05: qty 2

## 2013-02-05 MED ORDER — HEPARIN BOLUS VIA INFUSION
3200.0000 [IU] | Freq: Once | INTRAVENOUS | Status: DC
  Filled 2013-02-05: qty 3200

## 2013-02-05 MED ORDER — ONDANSETRON HCL 4 MG/2ML IJ SOLN: 4.0000 mg | Freq: Four times a day (QID) | INTRAMUSCULAR | Status: DC | PRN

## 2013-02-05 MED ORDER — ASPIRIN EC 325 MG PO TBEC
DELAYED_RELEASE_TABLET | ORAL | Status: AC
  Administered 2013-02-05: 09:00:00
  Filled 2013-02-05: qty 1

## 2013-02-05 MED ORDER — LIDOCAINE HCL (PF) 1 % IJ SOLN
INTRAMUSCULAR | Status: AC
  Filled 2013-02-05: qty 30

## 2013-02-05 MED ORDER — ACETAMINOPHEN 325 MG PO TABS: 650.0000 mg | ORAL_TABLET | ORAL | Status: DC | PRN

## 2013-02-05 MED ORDER — NITROGLYCERIN 0.2 MG/ML ON CALL CATH LAB
INTRAVENOUS | Status: AC
  Filled 2013-02-05: qty 1

## 2013-02-05 MED ORDER — HEPARIN (PORCINE) IN NACL 2-0.9 UNIT/ML-% IJ SOLN
INTRAMUSCULAR | Status: AC
  Filled 2013-02-05: qty 1000

## 2013-02-05 MED ORDER — MORPHINE SULFATE 2 MG/ML IJ SOLN: 1.0000 mg | INTRAMUSCULAR | Status: DC | PRN

## 2013-02-05 MED ORDER — HEPARIN (PORCINE) IN NACL 100-0.45 UNIT/ML-% IJ SOLN
650.0000 [IU]/h | INTRAMUSCULAR | Status: DC
  Filled 2013-02-05: qty 250

## 2013-02-05 NOTE — Progress Notes (Signed)
Pt ambulated 350 feet with RN; no walker needed; steady gait noted; no c/o pain; R groin level 0; pt back to room to bed; call bell w/i reach; will cont. To monitor.

## 2013-02-05 NOTE — Progress Notes (Signed)
ANTICOAGULATION CONSULT NOTE - Initial Consult  Pharmacy Consult for Heparin Indication: chest pain/ACS  Allergies  Allergen Reactions  . Acetaminophen Rash   Patient Measurements: Height: 5\' 10"  (177.8 cm) Weight: 118 lb 2.7 oz (53.6 kg) IBW/kg (Calculated) : 73  Vital Signs: Temp: 99 F (37.2 C) (06/25 0415) Temp src: Oral (06/25 0415) BP: 165/84 mmHg (06/25 0854) Pulse Rate: 116 (06/25 0854)  Labs:  Recent Labs  02/03/13 1929 02/04/13 0229 02/04/13 0652 02/04/13 1531 02/05/13 0530  HGB 12.7* 12.6*  --   --   --   HCT 37.1* 37.7*  --   --   --   PLT 196 204  --   --   --   CREATININE 1.09 0.98  --   --  1.13  TROPONINI  --  <0.30 <0.30 <0.30  --     Estimated Creatinine Clearance: 43.5 ml/min (by C-G formula based on Cr of 1.13).   Medical History: Past Medical History  Diagnosis Date  . HTN (hypertension)   . Hyperlipemia   . CAD (coronary artery disease)     last nuc 02/24/11- no ischemia, small, fixed distal anterior, apicla anterior defect (scar); last cath 07/04/11-1/3 grafts patent, occluded vein to ramus and diag branches  . Prostate cancer   . PVD (peripheral vascular disease)     LE dopp (10/19/11) patent R SFA stent, R ant tib occluded with reconstitution within the dorsalis pedis, L SFA 70-99%, L ant tib occluded with reconstitution of flow in the dorsalis pedis, bil ABI 1.2; carotid dopp (04-27-08)-normal  . H/O echocardiogram 07/08/2008    EF 40-45%, mod-severe inferior wall hypokinesis; no sign valvular disease  . Myocardial infarction   . Anginal pain   . Shortness of breath   . GERD (gastroesophageal reflux disease)     Medications:  Prescriptions prior to admission  Medication Sig Dispense Refill  . aspirin EC 81 MG tablet Take 81 mg by mouth daily.      . cholecalciferol (VITAMIN D) 1000 UNITS tablet Take 1,000 Units by mouth daily.      . folic acid (FOLVITE) 1 MG tablet Take 1 mg by mouth See admin instructions. Take on every day except  Fridays (don't take with methotrexate)      . methotrexate (RHEUMATREX) 2.5 MG tablet Take 7.5 mg by mouth once a week. Caution:Chemotherapy. Protect from light. Take on Fridays      . metoprolol (LOPRESSOR) 50 MG tablet Take 25 mg by mouth 2 (two) times daily.      . mirtazapine (REMERON) 15 MG tablet Take 15 mg by mouth at bedtime.      . nitroGLYCERIN (NITROSTAT) 0.4 MG SL tablet Place 0.4 mg under the tongue every 5 (five) minutes as needed for chest pain.      Marland Kitchen oxybutynin (DITROPAN) 5 MG tablet Take 5 mg by mouth 2 (two) times daily.       . pantoprazole (PROTONIX) 40 MG tablet Take 40 mg by mouth 2 (two) times daily as needed (heartburn).       Bertram Gala Glycol-Propyl Glycol (SYSTANE) 0.4-0.3 % SOLN Place 1 drop into both eyes 2 (two) times daily as needed (dry eyes).       . pravastatin (PRAVACHOL) 40 MG tablet Take 40 mg by mouth at bedtime.      . predniSONE (DELTASONE) 5 MG tablet Take 7.5 mg by mouth daily. Takes with milk      . PRESCRIPTION MEDICATION Apply 1 application topically at  bedtime as needed (for back and neck pain). Medicated cream from [place]      . traMADol (ULTRAM) 50 MG tablet Take 100 mg by mouth 3 (three) times daily as needed for pain. Maximum dose= 8 tablets per day        Assessment: 77 y/o male with a history of CAD who presented yesterday with substernal chest pain. Patient is currently in nuclear medicine getting a stress test. Pharmacy consulted to begin a heparin drip. Troponins are negative. No bleeding noted, CBC is stable.  Goal of Therapy:  Heparin level 0.3-0.7 units/ml Monitor platelets by anticoagulation protocol: Yes   Plan:  -Discontinue Lovenox SQ -Heparin 3200 units IV bolus then infuse at 650 units/hr -Heparin level 8 hours after started -Daily heparin level and CBC while on heparin  [facility], [place].D., BCPS Clinical Pharmacist Pager: [phone] 02/05/2013 8:59 AM

## 2013-02-05 NOTE — Progress Notes (Signed)
Subjective: 9/10 back pain between shoulder blades which radiated up to the right ear.  + diaphoresis.  Objective: Vital signs in last 24 hours: Temp:  [98 F (36.7 C)-99 F (37.2 C)] 99 F (37.2 C) (06/25 0415) Pulse Rate:  [64-74] 67 (06/25 0834) Resp:  [18] 18 (06/25 0415) BP: (112-147)/(68-86) 147/83 mmHg (06/25 0834) SpO2:  [97 %-98 %] 97 % (06/25 0415) Weight:  [118 lb 2.7 oz (53.6 kg)] 118 lb 2.7 oz (53.6 kg) (06/25 0415) Last BM Date: 02/03/13  Intake/Output from previous day: 06/24 0701 - 06/25 0700 In: 240 [P.O.:240] Out: 3 [Urine:1; Stool:2] Intake/Output this shift:    Medications Current Facility-Administered Medications  Medication Dose Route Frequency Provider Last Rate Last Dose  . aspirin EC tablet 325 mg  325 mg Oral Daily Eduard Clos, MD   325 mg at 02/04/13 1014  . cholecalciferol (VITAMIN D) tablet 1,000 Units  1,000 Units Oral Daily Eduard Clos, MD   1,000 Units at 02/04/13 1014  . enoxaparin (LOVENOX) injection 40 mg  40 mg Subcutaneous Q24H Eduard Clos, MD   40 mg at 02/04/13 1014  . feeding supplement (ENSURE COMPLETE) liquid 237 mL  237 mL Oral BID BM Ailene Ards, RD   237 mL at 02/04/13 1700  . folic acid (FOLVITE) tablet 1 mg  1 mg Oral Custom Eduard Clos, MD   1 mg at 02/04/13 1014  . [START ON 02/07/2013] methotrexate (RHEUMATREX) tablet 7.5 mg  7.5 mg Oral Q Fri Eduard Clos, MD      . metoprolol tartrate (LOPRESSOR) tablet 25 mg  25 mg Oral BID Eduard Clos, MD   25 mg at 02/04/13 2109  . mirtazapine (REMERON) tablet 15 mg  15 mg Oral QHS Eduard Clos, MD   15 mg at 02/04/13 2109  . nitroGLYCERIN (NITROSTAT) SL tablet 0.4 mg  0.4 mg Sublingual Q5 min PRN Flint Melter, MD      . nitroGLYCERIN (NITROSTAT) SL tablet 0.4 mg  0.4 mg Sublingual Q5 min PRN Eduard Clos, MD      . ondansetron [facility]) tablet 4 mg  4 mg Oral Q6H PRN Eduard Clos, MD       Or  . ondansetron  [facility]) injection 4 mg  4 mg Intravenous Q6H PRN Eduard Clos, MD      . oxybutynin (DITROPAN) tablet 5 mg  5 mg Oral BID Eduard Clos, MD   5 mg at 02/04/13 2109  . pantoprazole (PROTONIX) EC tablet 40 mg  40 mg Oral BID PRN Eduard Clos, MD      . polyvinyl alcohol (LIQUIFILM TEARS) 1.4 % ophthalmic solution 1 drop  1 drop Both Eyes BID PRN Eduard Clos, MD   1 drop at 02/04/13 0157  . predniSONE (DELTASONE) tablet 7.5 mg  7.5 mg Oral Daily Eduard Clos, MD   7.5 mg at 02/04/13 1014  . regadenoson (LEXISCAN) 0.4 MG/5ML injection SOLN           . regadenoson (LEXISCAN) injection SOLN 0.4 mg  0.4 mg Intravenous Once Brittainy Simmons, PA-C      . simvastatin (ZOCOR) tablet 20 mg  20 mg Oral q1800 Eduard Clos, MD   20 mg at 02/04/13 1700  . sodium chloride 0.9 % injection 3 mL  3 mL Intravenous Q12H Eduard Clos, MD      . traMADol Janean Sark) tablet 100 mg  100 mg  Oral TID PRN Eduard Clos, MD        PE: General appearance: alert, cooperative and severe CP during NST Lungs: clear to auscultation bilaterally Heart: regular rate and rhythm, S1, S2 normal, no murmur, click, rub or gallop Extremities: No LEE Pulses: 2+ and symmetric Neurologic: Grossly normal  Lab Results:   Recent Labs  02/03/13 1929 02/04/13 0229  WBC 9.5 9.3  HGB 12.7* 12.6*  HCT 37.1* 37.7*  PLT 196 204   BMET  Recent Labs  02/03/13 1929 02/04/13 0229 02/05/13 0530  NA 138 140 143  K 4.3 4.0 3.9  CL 105 105 107  CO2 25 26 26   GLUCOSE 154* 92 98  BUN 17 16 15   CREATININE 1.09 0.98 1.13  CALCIUM 8.7 8.7 9.1   PT/INR No results found for this basename: LABPROT, INR,  in the last 72 hours Cholesterol  Recent Labs  02/05/13 0530  CHOL 154   Cardiac Enzymes No components found with this basename: TROPONIN,  CKMB,   Studies/Results: @RISRSLT2 @   Assessment/Plan  Principal Problem:   Chest pain Active Problems:   HTN (hypertension)    Hyperlipemia   CAD (coronary artery disease)   Prostate cancer   PVD (peripheral vascular disease)  Plan:  He did NOT tolerate the Lexiscan at all.  Severe CP and radiation to back,SOB.  ST depressions in II, III, aVF, V5-6.  The test was terminated and the patient is going to the cath lab for [place] with possible PCI.  2 NTG SL and ASA given in Nuc med.   LOS: 2 days    HAGER, BRYAN 02/05/2013 8:38 AM   Agree with note written by Jones Skene PAC  Known CAD s/p CABG admitted with SSCP. Ruled out for MI. No EKG changes. Developed SSCP with EKG changes during Lexiscan infusion and therefor it was elected to abort imaging and proceed directly to cath.  Runell Gess 02/05/2013 10:30 AM

## 2013-02-05 NOTE — Progress Notes (Signed)
Pt back in room s/p cardiac cath; R groin level 0; family at bedside; VSS; bedrest until 1435; will cont. To monitor.

## 2013-02-05 NOTE — Progress Notes (Signed)
Pt assisted up OOB for first time post cardiac cath; R groin level O; family at bedside; will cont. To monitor.

## 2013-02-05 NOTE — H&P (Signed)
   Pt was reexamined and existing H & P reviewed. No changes found.  Runell Gess, MD [facility] 02/05/2013 9:20 AM   Cath Lab Visit (complete for each Cath Lab visit)  Clinical Evaluation Leading to the Procedure:   ACS: yes  Non-ACS:    Anginal Classification: CCS IV  Anti-ischemic medical therapy: Minimal Therapy (1 class of medications)  Non-Invasive Test Results: High-risk stress test findings: cardiac mortality >3%/year  Prior CABG: Previous CABG

## 2013-02-05 NOTE — Progress Notes (Signed)
Pt off unit for stress test at this time.

## 2013-02-05 NOTE — CV Procedure (Signed)
Tony Curtis is a 77 y.o. male    [id number] LOCATION:  FACILITY: [facility]  PHYSICIAN: Nanetta Batty, M.D. 02-Dec-1935   DATE OF PROCEDURE:  02/05/2013  DATE OF DISCHARGE:   CARDIAC CATHETERIZATION     History obtained from chart review.The patient is a 77 y/o AAM, followed at [facility] by Dr. [name]. He has known CAD, s/p CABG x 3 in 2009. He received a LIMA to LAD, a vein graft to the diagonal branch and a vein graft to the ramus branch. A repeat cardiac cath in 2012 revealed a patent LIMA graft, but both vein grafts were found to be occluded. He also has peripheral vascular disease, s/p PTA and stenting of his right SFA in 2012, as well as hypertension and hyperlipidemia. He presented to [facility] on 6/23 with a complaint of intermittent substernal chest pain, over the last several days, that was worse yesterday evening. The pain is similar to his angina before is CABG. The pain has become more frequent and wakes him from his sleep. It radiates to the left jaw back and bilateral scapula. The pain is non-exertional. Last PM, the pain was 10/10. It was responsive to SL NTG. He denies any other associated symptoms. He ruled out for myocardial infarction with negative enzymes. He underwent Myoview stress testing and developed chest pain and ST segment changes during Lexiscan infusion. Because of this he was brought to the cardiac catheter laboratory for urgent cath to define his anatomy and rule out an ischemic etiology.    PROCEDURE DESCRIPTION:    The patient was brought to the second floor [place] Cardiac cath lab in the postabsorptive state. He was notpremedicated . His right groinwas prepped and shaved in usual sterile fashion. Xylocaine 1% was used for local anesthesia. A 5 French sheath was inserted into the right common femoral artery using standard Seldinger technique. 5 French right and left Judkins diagnostic catheters along with a 5 French pigtail catheter was used for selective coronary  angiography, selective LIMA angiography and left ventriculography. Visipaque dye was used for the entirety of the case. Retrograde aortic, left ventricular pressures were recorded.  HEMODYNAMICS:    AO SYSTOLIC/AO DIASTOLIC: 139/68   LV SYSTOLIC/LV DIASTOLIC: 134/14  ANGIOGRAPHIC RESULTS:   1. Left main; 50% tapered distal  2. LAD; occluded at its origin 3. Left circumflex; nondominant with a 75% ostial OM1 stenosis. The ramus branch which arose from the left main had 50-60% segmental ostial/proximal stenosis unchanged from prior cath.  4. Right coronary artery; comment the small and free of significant disease 5.LIMA TO LAD; widely patent 6. SVG TO ramus branch was occluded at the aorta and prior cath     SVG TO PDA was occluded at the aorta from prior cath   7. Left ventriculography; RAO left ventriculogram was performed using  25 mL of Visipaque dye at 12 mL/second. The overall LVEF estimated  60 % Without wall motion abnormalities  IMPRESSION:Mr. Doescher has unchanged anatomy from his prior cath 2 years ago. He is a tapered to present to the left main into a ramus branch and circumflex. The ramus branch has proximal disease that is moderate in degree and unchanged from prior cath in the right coronary artery is otherwise unchanged. His LV function is normal. I do not think his chest pain is ischemic. Continue medical therapy will be recommended.  The sheath was removed and pressure was held on the groin to achieve hemostasis. The patient left the Cath Lab in stable condition.  A total of 80 cc of contrast was administered to the patient.  Tony Gess MD, [facility] 02/05/2013 10:00 AM

## 2013-02-06 NOTE — Progress Notes (Signed)
[facility]  Subjective: No chest pain, but continues to have mid-upper back pain. No groin, low back or flank pain.   Objective: Vital signs in last 24 hours: Temp:  [97.9 F (36.6 C)-99 F (37.2 C)] 99 F (37.2 C) (06/26 0418) Pulse Rate:  [63-96] 83 (06/26 1016) Resp:  [16-18] 17 (06/26 0418) BP: (110-134)/(50-77) 110/72 mmHg (06/26 1016) SpO2:  [97 %-100 %] 97 % (06/26 0418) Last BM Date: 02/05/13  Intake/Output from previous day: 06/25 0701 - 06/26 0700 In: 951.3 [P.O.:480; I.V.:471.3] Out: 700 [Urine:700] Intake/Output this shift: Total I/O In: 480 [P.O.:480] Out: -   Medications Current Facility-Administered Medications  Medication Dose Route Frequency Provider Last Rate Last Dose  . aspirin chewable tablet 81 mg  81 mg Oral Daily Runell Gess, MD   81 mg at 02/06/13 1016  . feeding supplement (ENSURE COMPLETE) liquid 237 mL  237 mL Oral BID BM Ailene Ards, RD   237 mL at 02/06/13 1000  . [START ON 02/07/2013] methotrexate (RHEUMATREX) tablet 7.5 mg  7.5 mg Oral Q Fri Eduard Clos, MD      . metoprolol tartrate (LOPRESSOR) tablet 25 mg  25 mg Oral BID Eduard Clos, MD   25 mg at 02/06/13 1017  . mirtazapine (REMERON) tablet 15 mg  15 mg Oral QHS Eduard Clos, MD   15 mg at 02/05/13 2235  . morphine 2 MG/ML injection 1 mg  1 mg Intravenous Q1H PRN Runell Gess, MD      . nitroGLYCERIN (NITROSTAT) SL tablet 0.4 mg  0.4 mg Sublingual Q5 min PRN Flint Melter, MD   0.4 mg at 02/05/13 0850  . nitroGLYCERIN (NITROSTAT) SL tablet 0.4 mg  0.4 mg Sublingual Q5 min PRN Eduard Clos, MD   0.4 mg at 02/05/13 0844  . ondansetron (ZOFRAN) tablet 4 mg  4 mg Oral Q6H PRN Eduard Clos, MD       Or  . ondansetron [facility]) injection 4 mg  4 mg Intravenous Q6H PRN Eduard Clos, MD      . oxybutynin (DITROPAN) tablet 5 mg  5 mg Oral BID Eduard Clos, MD   5 mg at 02/06/13 1017  .  pantoprazole (PROTONIX) EC tablet 40 mg  40 mg Oral BID PRN Eduard Clos, MD   40 mg at 02/06/13 1016  . polyvinyl alcohol (LIQUIFILM TEARS) 1.4 % ophthalmic solution 1 drop  1 drop Both Eyes BID PRN Eduard Clos, MD   1 drop at 02/04/13 0157  . predniSONE (DELTASONE) tablet 7.5 mg  7.5 mg Oral Daily Eduard Clos, MD   7.5 mg at 02/06/13 1017  . simvastatin (ZOCOR) tablet 20 mg  20 mg Oral q1800 Eduard Clos, MD   20 mg at 02/05/13 1643  . sodium chloride 0.9 % injection 3 mL  3 mL Intravenous Q12H Eduard Clos, MD   3 mL at 02/06/13 1020  . traMADol (ULTRAM) tablet 100 mg  100 mg Oral TID PRN Eduard Clos, MD        PE: General appearance: alert, cooperative and no distress Lungs: clear to auscultation bilaterally Extremities: no LEE; right groin: no ecchymosis, nontender, no bruit Pulses: 2+ and symmetric Skin: warm and dry Neurologic: Grossly normal  Lab Results:   Recent Labs  02/03/13 1929 02/04/13 0229  WBC 9.5 9.3  HGB 12.7* 12.6*  HCT 37.1* 37.7*  PLT 196 204  BMET  Recent Labs  02/03/13 1929 02/04/13 0229 02-07-13 0530  NA 138 140 143  K 4.3 4.0 3.9  CL 105 105 107  CO2 25 26 26   GLUCOSE 154* 92 98  BUN 17 16 15   CREATININE 1.09 0.98 1.13  CALCIUM 8.7 8.7 9.1   Cholesterol  Recent Labs  02-07-13 0530  CHOL 154   Cardiac Enzymes No components found with this basename: TROPONIN,  CKMB,   Studies/Results:  Diagnostic LHC Feb 07, 2013 HEMODYNAMICS:  AO SYSTOLIC/AO DIASTOLIC: 139/68  LV SYSTOLIC/LV DIASTOLIC: 134/14  ANGIOGRAPHIC RESULTS:  1. Left main; 50% tapered distal  2. LAD; occluded at its origin  3. Left circumflex; nondominant with a 75% ostial OM1 stenosis. The ramus branch which arose from the left main had 50-60% segmental ostial/proximal stenosis unchanged from prior cath.  4. Right coronary artery; comment the small and free of significant disease  5.LIMA TO LAD; widely patent  6. SVG TO ramus  branch was occluded at the aorta and prior cath  SVG TO PDA was occluded at the aorta from prior cath  7. Left ventriculography; RAO left ventriculogram was performed using  25 mL of Visipaque dye at 12 mL/second. The overall LVEF estimated  60 % Without wall motion abnormalities  IMPRESSION:Mr. Schue has unchanged anatomy from his prior cath 2 years ago. He is a tapered to present to the left main into a ramus branch and circumflex. The ramus branch has proximal disease that is moderate in degree and unchanged from prior cath in the right coronary artery is otherwise unchanged. His LV function is normal. I do not think his chest pain is ischemic. Continue medical therapy will be recommended. Runell Gess MD, [facility]   Assessment/Plan  Principal Problem:   Chest pain Active Problems:   HTN (hypertension)   Hyperlipemia   CAD (coronary artery disease)   Prostate cancer   PVD (peripheral vascular disease)   Protein-calorie malnutrition, severe  Plan: A diagnostic LHC yesterday revealed that his coronary anatomy is unchanged from his previous cath 2 years ago. His LV function was normal, with an EF of 60%. No WMA. Continue with medical therapy: ASA, BB and statin. HR and BP both stable. The right groin is stable. ? Possible discharge later today.     LOS: 3 days    Brittainy M. Delmer Islam 02/06/2013 10:23 AM  Agree with note written by Boyce Medici  [facility]  Cath revealed unchanged anatomy. Only complaint is back pain. Labs OK. Exam benign. Groin OK. D'C home today. ROV with MLP 1-2 weeks.   Runell Gess 02/06/2013 10:51 AM

## 2013-02-06 NOTE — Discharge Summary (Signed)
Physician Discharge Summary  Patient ID: Tony Curtis MRN: [id number] DOB/AGE: [birth year] 77 y.o.  Admit date: 02/03/2013 Discharge date: 02/06/2013  Admission Diagnoses: Chest Pain  Discharge Diagnoses:  Principal Problem:   Chest pain Active Problems:   HTN (hypertension)   Hyperlipemia   CAD (coronary artery disease)   Prostate cancer   PVD (peripheral vascular disease)   Protein-calorie malnutrition, severe   Discharged Condition: stable  Hospital Course: The patient is a 77 y/o AAM, who presented to [facility] on 02/03[facility]13 with a complaint of chest pain. He has known CAD. His past cardiac history is significant for the following: s/p CABG in 2009. He underwent outpatient cath by Dr. Lynnea Ferrier which demonstrated a proximally occluded LAD, 50% ramus disease and collaterals from the RCA and LCx to the LAD. He underwent CABG and ultimately received a LIMA-LAD, SVG to RAMUS and SVG to PDA. He had recurrent chest pain and pain to his shoulder blades and underwent cath in 2012 which revealed both SVG's to be occluded, but no significant disease was noted in the RCA, LCx or Ramus branches. The LIMA was patent and not atretic. EF had improved from 35-40% up to 60% at that time. He had a NST in 2012, which revealed a possible apical scar, no ischemia and preserved EF.  When he presented on 02/03/13, he had endorsed both typical and atypical chest pain symptoms. He was admitted for MI rule out. His troponins were cycled and were negative x 3. His EKG and CXR were both unremarkable. His chest pain ultimately resolved. He then underwent a NST and developed chest pain and ST segment changes during Lexiscan infusion. Because of this, he was brought to the cardiac catheter laboratory for urgent cath to define his anatomy and rule out an ischemic etiology. This was performed by Dr. [name], via the right femoral artery. The cath revealed no change in his anatomy, compared to prior cath in 2012 (detailed Angiographic  results listed below). His LV function was normal. His EF was estimated at 60%. There were no WMA. He left the cath lab in stable condition. His medications were resumed, including ASA, a BB and a statin. He had no further chest pain. The right femoral access site remained stable. He was last seen and examined by Dr. [name], who determined he was stable for discharge home. He will follow-up at [facility] with Corine Shelter, PA-C, on 02/21/13.    Consults: None  Significant Diagnostic Studies:   Left Heart Cath 02/05/13 HEMODYNAMICS:  AO SYSTOLIC/AO DIASTOLIC: 139/68  LV SYSTOLIC/LV DIASTOLIC: 134/14  ANGIOGRAPHIC RESULTS:  1. Left main; 50% tapered distal  2. LAD; occluded at its origin  3. Left circumflex; nondominant with a 75% ostial OM1 stenosis. The ramus branch which arose from the left main had 50-60% segmental ostial/proximal stenosis unchanged from prior cath.  4. Right coronary artery; comment the small and free of significant disease  5.LIMA TO LAD; widely patent  6. SVG TO ramus branch was occluded at the aorta and prior cath  SVG TO PDA was occluded at the aorta from prior cath  7. Left ventriculography; RAO left ventriculogram was performed using  25 mL of Visipaque dye at 12 mL/second. The overall LVEF estimated  60 % Without wall motion abnormalities   Treatments: See Hospital Course  Discharge Exam: Blood pressure 123/74, pulse 83, temperature 98.9 F (37.2 C), temperature source Oral, resp. rate 16, height 5\' 10"  (1.778 m), weight 118 lb 2.7 oz (53.6 kg), SpO2 97.00%.  Disposition: 01-Home or Self Care      Discharge Orders   Future Appointments Provider Department Dept Phone   02/21/2013 1:40 PM Abelino Derrick, PA-C [facility] [place] [phone]   Future Orders Complete By Expires     Diet - low sodium heart healthy  As directed     Driving Restrictions  As directed     Comments:      No driving for 3 days    Increase activity slowly   As directed     Lifting restrictions  As directed     Comments:      No lifting more than 1/2 gallon of milk for 3 days        Medication List         aspirin EC 81 MG tablet  Take 81 mg by mouth daily.     cholecalciferol 1000 UNITS tablet  Commonly known as:  VITAMIN D  Take 1,000 Units by mouth daily.     folic acid 1 MG tablet  Commonly known as:  FOLVITE  Take 1 mg by mouth See admin instructions. Take on every day except Fridays (don't take with methotrexate)     methotrexate 2.5 MG tablet  Commonly known as:  RHEUMATREX  Take 7.5 mg by mouth once a week. Caution:Chemotherapy. Protect from light. Take on Fridays     metoprolol 50 MG tablet  Commonly known as:  LOPRESSOR  Take 25 mg by mouth 2 (two) times daily.     mirtazapine 15 MG tablet  Commonly known as:  REMERON  Take 15 mg by mouth at bedtime.     nitroGLYCERIN 0.4 MG SL tablet  Commonly known as:  NITROSTAT  Place 0.4 mg under the tongue every 5 (five) minutes as needed for chest pain.     oxybutynin 5 MG tablet  Commonly known as:  DITROPAN  Take 5 mg by mouth 2 (two) times daily.     pantoprazole 40 MG tablet  Commonly known as:  PROTONIX  Take 40 mg by mouth 2 (two) times daily as needed (heartburn).     pravastatin 40 MG tablet  Commonly known as:  PRAVACHOL  Take 40 mg by mouth at bedtime.     predniSONE 5 MG tablet  Commonly known as:  DELTASONE  Take 7.5 mg by mouth daily. Takes with milk     PRESCRIPTION MEDICATION  Apply 1 application topically at bedtime as needed (for back and neck pain). [facility]icated [facility] from [facility]     SYSTANE 0.4-0.3 % Soln  Generic drug:  Polyethyl Glycol-Propyl Glycol  Place 1 drop into both eyes 2 (two) times daily as needed (dry eyes).     traMADol 50 MG tablet  Commonly known as:  ULTRAM  Take 100 mg by mouth 3 (three) times daily as needed for pain. Maximum dose= 8 tablets per day       Follow-up Information   Follow up with Abelino Derrick, PA-C On  02/21/2013. (1:40 pm)    Contact information:   [address] [place] [address] [phone]      TIME SPENT ON DISCHARGE, INCLUDING PHYSICIAN TIME: >30 MINUTES  Signed: Allayne Butcher, PA-C 02/10/2013, 9:44 AM

## 2013-02-06 NOTE — Progress Notes (Signed)
Pt given d/c instructions; IV and tele monitor d/c at this time; pt to d/c home with wife 

## 2013-02-21 ENCOUNTER — Ambulatory Visit (INDEPENDENT_AMBULATORY_CARE_PROVIDER_SITE_OTHER): Payer: Medicare Other | Admitting: Cardiology

## 2013-02-21 ENCOUNTER — Encounter: Payer: Self-pay | Admitting: Cardiology

## 2013-02-21 VITALS — BP 136/84 | HR 83 | Ht 71.0 in | Wt 127.1 lb

## 2013-02-21 DIAGNOSIS — E43 Unspecified severe protein-calorie malnutrition: Secondary | ICD-10-CM

## 2013-02-21 DIAGNOSIS — I2510 Atherosclerotic heart disease of native coronary artery without angina pectoris: Secondary | ICD-10-CM

## 2013-02-21 DIAGNOSIS — E785 Hyperlipidemia, unspecified: Secondary | ICD-10-CM

## 2013-02-21 DIAGNOSIS — I10 Essential (primary) hypertension: Secondary | ICD-10-CM

## 2013-02-21 DIAGNOSIS — I739 Peripheral vascular disease, unspecified: Secondary | ICD-10-CM

## 2013-02-21 NOTE — Assessment & Plan Note (Signed)
Recent LDL 80

## 2013-02-21 NOTE — Progress Notes (Signed)
02/21/2013 Tony Curtis   03-30-1936  [id number]  Primary Physicia [name]., MD Primary Cardiologist: Dr [name]  HPI:  77 y/o [place] war vet followed by Dr [name] and the [place] in [place] (Dr [name]). He had CABG X 3 in 2009.  He has some type of chronic fatigue, malnutrition syndrome and is on chronic steroids. He was recently admitted for chest pain and had a cath which revealed an occluded SVG-RI, and an occluded SVG-PDA. This is unchanged from his previous cath from 2012.  His EF at cath was 60% and this is actually improved from a prior echo that showed his EF to 40% in 2009. The plan is for continued medical RX. He is in the office for follow up and has been doing well. He can return to work [employer] ) Monday.   Current Outpatient Prescriptions  Medication Sig Dispense Refill  . aspirin EC 81 MG tablet Take 81 mg by mouth daily.      . cholecalciferol (VITAMIN D) 1000 UNITS tablet Take 1,000 Units by mouth daily.      . folic acid (FOLVITE) 1 MG tablet Take 1 mg by mouth See admin instructions. Take on every day except Fridays (don't take with methotrexate)      . methotrexate (RHEUMATREX) 2.5 MG tablet Take 7.5 mg by mouth once a week. Caution:Chemotherapy. Protect from light. Take on Fridays      . metoprolol (LOPRESSOR) 50 MG tablet Take 25 mg by mouth 2 (two) times daily.      . mirtazapine (REMERON) 15 MG tablet Take 15 mg by mouth at bedtime.      . nitroGLYCERIN (NITROSTAT) 0.4 MG SL tablet Place 0.4 mg under the tongue every 5 (five) minutes as needed for chest pain.      Marland Kitchen oxybutynin (DITROPAN) 5 MG tablet Take 5 mg by mouth 2 (two) times daily.       . pantoprazole (PROTONIX) 40 MG tablet Take 40 mg by mouth 2 (two) times daily as needed (heartburn).       Bertram Gala Glycol-Propyl Glycol (SYSTANE) 0.4-0.3 % SOLN Place 1 drop into both eyes 2 (two) times daily as needed (dry eyes).       . pravastatin (PRAVACHOL) 40 MG tablet Take 40 mg by mouth at  bedtime.      . predniSONE (DELTASONE) 5 MG tablet Take 7.5 mg by mouth daily. Takes with milk      . PRESCRIPTION MEDICATION Apply 1 application topically at bedtime as needed (for back and neck pain). Medicated cream from [place]      . traMADol (ULTRAM) 50 MG tablet Take 100 mg by mouth 3 (three) times daily as needed for pain. Maximum dose= 8 tablets per day       No current facility-administered medications for this visit.    Allergies  Allergen Reactions  . Acetaminophen Rash    History   Social History  . Marital Status: Married    Spouse Name: N/A    Number of Children: N/A  . Years of Education: N/A   Occupational History  . Not on file.   Social History Main Topics  . Smoking status: Former Smoker    Quit date: 06/14/1979  . Smokeless tobacco: Never Used  . Alcohol Use: Yes     Comment: occasional wine  . Drug Use: No  . Sexually Active: Not on file   Other Topics Concern  . Not on file   Social History Narrative  .  No narrative on file     Review of Systems: General: negative for chills, fever, night sweats or weight changes.  Cardiovascular: negative for chest pain, dyspnea on exertion, edema, orthopnea, palpitations, paroxysmal nocturnal dyspnea or shortness of breath Dermatological: negative for rash Respiratory: negative for cough or wheezing Urologic: negative for hematuria Abdominal: negative for nausea, vomiting, diarrhea, bright red blood per rectum, melena, or hematemesis Neurologic: negative for visual changes, syncope, or dizziness All other systems reviewed and are otherwise negative except as noted above.    Blood pressure 136/84, pulse 83, height 5\' 11"  (1.803 m), weight 127 lb 1.6 oz (57.652 kg).  General appearance: alert, cooperative, cachectic, no distress and moderate distress Neck: no carotid bruit and no JVD Lungs: clear to auscultation bilaterally Heart: regular rate and rhythm Extremities: Rt groin without hematoma  EKG  EKG:  normal EKG, normal sinus rhythm, unchanged from previous tracings.  ASSESSMENT AND PLAN:   CAD, CABG X 3 9/09. Cath 2012 and 02/05/13- 2/3 grafts occluded- medical Rx No anginal pain, OK to return to work from our standpoint  PVD (peripheral vascular disease) Last LEA dopplers 3/13- ABIs 1.2 bilat  HTN (hypertension) Treated  Hyperlipemia Recent LDL 80  Protein-calorie malnutrition, severe Being followed at [facility]- Dr [name]   PLAN  Same RX, follow up with Dr [name] 6 months   [facility] KPA-C 02/21/2013 2:10 PM

## 2013-02-21 NOTE — Assessment & Plan Note (Signed)
Last LEA dopplers 3/13- ABIs 1.2 bilat

## 2013-02-21 NOTE — Patient Instructions (Signed)
Follow up in 6 months 

## 2013-02-21 NOTE — Assessment & Plan Note (Signed)
Treated

## 2013-02-21 NOTE — Assessment & Plan Note (Signed)
No anginal pain, OK to return to work from our standpoint

## 2013-02-21 NOTE — Assessment & Plan Note (Signed)
Being followed at [facility]- Dr [name]

## 2013-02-27 ENCOUNTER — Encounter: Payer: Self-pay | Admitting: *Deleted

## 2013-06-04 ENCOUNTER — Emergency Department (HOSPITAL_COMMUNITY): Payer: Medicare Other

## 2013-06-04 ENCOUNTER — Encounter (HOSPITAL_COMMUNITY): Payer: Self-pay | Admitting: Emergency Medicine

## 2013-06-04 ENCOUNTER — Observation Stay (HOSPITAL_COMMUNITY)
Admission: EM | Admit: 2013-06-04 | Discharge: 2013-06-05 | Disposition: A | Discharge status: Home / Self Care | Payer: Medicare Other | Attending: Cardiovascular Disease | Admitting: Cardiovascular Disease

## 2013-06-04 DIAGNOSIS — I2510 Atherosclerotic heart disease of native coronary artery without angina pectoris: Principal | ICD-10-CM | POA: Insufficient documentation

## 2013-06-04 DIAGNOSIS — R072 Precordial pain: Secondary | ICD-10-CM | POA: Insufficient documentation

## 2013-06-04 DIAGNOSIS — R079 Chest pain, unspecified: Secondary | ICD-10-CM | POA: Diagnosis present

## 2013-06-04 DIAGNOSIS — Z87891 Personal history of nicotine dependence: Secondary | ICD-10-CM | POA: Insufficient documentation

## 2013-06-04 DIAGNOSIS — R110 Nausea: Secondary | ICD-10-CM | POA: Insufficient documentation

## 2013-06-04 DIAGNOSIS — Z951 Presence of aortocoronary bypass graft: Secondary | ICD-10-CM | POA: Insufficient documentation

## 2013-06-04 DIAGNOSIS — R0602 Shortness of breath: Secondary | ICD-10-CM | POA: Insufficient documentation

## 2013-06-04 DIAGNOSIS — L301 Dyshidrosis [pompholyx]: Secondary | ICD-10-CM | POA: Insufficient documentation

## 2013-06-04 DIAGNOSIS — Z8546 Personal history of malignant neoplasm of prostate: Secondary | ICD-10-CM | POA: Insufficient documentation

## 2013-06-04 DIAGNOSIS — E785 Hyperlipidemia, unspecified: Secondary | ICD-10-CM

## 2013-06-04 DIAGNOSIS — K219 Gastro-esophageal reflux disease without esophagitis: Secondary | ICD-10-CM | POA: Insufficient documentation

## 2013-06-04 DIAGNOSIS — Z79899 Other long term (current) drug therapy: Secondary | ICD-10-CM | POA: Insufficient documentation

## 2013-06-04 DIAGNOSIS — I252 Old myocardial infarction: Secondary | ICD-10-CM | POA: Insufficient documentation

## 2013-06-04 DIAGNOSIS — N390 Urinary tract infection, site not specified: Secondary | ICD-10-CM | POA: Diagnosis present

## 2013-06-04 DIAGNOSIS — Z9861 Coronary angioplasty status: Secondary | ICD-10-CM | POA: Insufficient documentation

## 2013-06-04 DIAGNOSIS — I739 Peripheral vascular disease, unspecified: Secondary | ICD-10-CM | POA: Insufficient documentation

## 2013-06-04 DIAGNOSIS — R9431 Abnormal electrocardiogram [ECG] [EKG]: Secondary | ICD-10-CM | POA: Diagnosis present

## 2013-06-04 DIAGNOSIS — I10 Essential (primary) hypertension: Secondary | ICD-10-CM | POA: Insufficient documentation

## 2013-06-04 HISTORY — DX: Abnormal electrocardiogram (ECG) (EKG): R94.31

## 2013-06-04 HISTORY — DX: Urinary tract infection, site not specified: N39.0

## 2013-06-04 LAB — BASIC METABOLIC PANEL
CO2: 27 mEq/L (ref 19–32)
Calcium: 9.2 mg/dL (ref 8.4–10.5)
GFR calc non Af Amer: 50 mL/min — ABNORMAL LOW (ref >90)
Glucose, Bld: 101 mg/dL — ABNORMAL HIGH (ref 70–99)
Sodium: 138 mEq/L (ref 135–145)

## 2013-06-04 LAB — CBC WITH DIFFERENTIAL/PLATELET
Basophils Relative: 0 % (ref 0–1)
Eosinophils Absolute: 0 10*3/uL (ref 0.0–0.7)
Eosinophils Relative: 0 % (ref 0–5)
HCT: 39.2 % (ref 39.0–52.0)
Hemoglobin: 13.2 g/dL (ref 13.0–17.0)
Lymphocytes Relative: 10 % — ABNORMAL LOW (ref 12–46)
Lymphs Abs: 1.3 10*3/uL (ref 0.7–4.0)
MCH: 31.6 pg (ref 26.0–34.0)
MCV: 93.8 fL (ref 78.0–100.0)
Monocytes Relative: 9 % (ref 3–12)
Platelets: 203 10*3/uL (ref 150–400)
RBC: 4.18 MIL/uL — ABNORMAL LOW (ref 4.22–5.81)
RDW: 13.4 % (ref 11.5–15.5)
WBC: 12.3 10*3/uL — ABNORMAL HIGH (ref 4.0–10.5)

## 2013-06-04 LAB — POCT I-STAT TROPONIN I: Troponin i, poc: 0.01 ng/mL (ref 0.00–0.08)

## 2013-06-04 LAB — PRO B NATRIURETIC PEPTIDE: Pro B Natriuretic peptide (BNP): 1218 pg/mL — ABNORMAL HIGH (ref 0–450)

## 2013-06-04 MED ORDER — NITROGLYCERIN 2 % TD OINT
0.5000 [in_us] | TOPICAL_OINTMENT | Freq: Four times a day (QID) | TRANSDERMAL | Status: DC
  Administered 2013-06-04 – 2013-06-05 (×2): 0.5 [in_us] via TOPICAL
  Filled 2013-06-04: qty 30
  Filled 2013-06-04: qty 1

## 2013-06-04 NOTE — ED Notes (Signed)
Tony Maze, MD informed of pts concern regarding difficulty swallowing

## 2013-06-04 NOTE — H&P (Signed)
Cardiology History and Physical  [name]., MD  History of Present Illness (and review of medical records): Tony Curtis is a 77 y.o. male who presents for evaluation of chest pain.  He has hx of CAD s/p CABG with 2/3 grafts occluded s/p Cath in 01/2013 revealing stable anatomy with preserved LV function.  He was out blowing leaves today at home when he felt weak and developed midsternal pain.  Pain was 9/10.  No other associated symptoms.  Did not take NTG at home.  He attempted to go the work this evening, but pain did not go away.  He was urged to call EMS.  He was given ASA and NTG.  He is now pain free.  He has negative trop and elevated BNP.  Previous studies: Stress test 01/2013: Findings: The exam was stopped after rest images were obtained and  stress images were not obtained as part of this study.  The rest images show a small focus of decreased activity in the  distal anteroseptal wall compatible with a small infarct or area of  attenuation. Left ventricular cavity size appears grossly normal.  IMPRESSION:  Small scar or infarct in the distal anteroseptal wall.  Cath 01/2013: 1. Left main; 50% tapered distal  2. LAD; occluded at its origin  3. Left circumflex; nondominant with a 75% ostial OM1 stenosis. The ramus branch which arose from the left main had 50-60% segmental ostial/proximal stenosis unchanged from prior cath.  4. Right coronary artery; comment the small and free of significant disease  5.LIMA TO LAD; widely patent  6. SVG TO ramus branch was occluded at the aorta and prior cath  SVG TO PDA was occluded at the aorta from prior cath  7. Left ventriculography; RAO left ventriculogram was performed using  25 mL of Visipaque dye at 12 mL/second. The overall LVEF estimated  60 % Without wall motion abnormalities  IMPRESSION:Tony Curtis has unchanged anatomy from his prior cath 2 years ago. He is a tapered to present to the left main into a ramus branch and  circumflex. The ramus branch has proximal disease that is moderate in degree and unchanged from prior cath in the right coronary artery is otherwise unchanged. His LV function is normal. I do not think his chest pain is ischemic. Continue medical therapy will be recommended.  Review of Systems He denies any N/V/F/C.  He reports occasional PND, denies orthopnea or LE swelling.   Further review of systems was otherwise negative other than stated in HPI.  Patient Active Problem List   Diagnosis Date Noted  . Protein-calorie malnutrition, severe 02/05/2013  . Chest pain 02/04/2013  . HTN (hypertension)   . Hyperlipemia   . CAD, CABG X 3 9/09. Cath 2012 and 02/05/13- 2/3 grafts occluded- medical Rx   . Prostate cancer   . PVD (peripheral vascular disease)    Past Medical History  Diagnosis Date  . HTN (hypertension)   . Hyperlipemia   . CAD (coronary artery disease)     CABG 9/09. Cath 2012 and 02/05/13  . Prostate cancer   . PVD (peripheral vascular disease)     LE dopp (10/19/11) patent R SFA stent, R ant tib occluded with reconstitution within the dorsalis pedis, L SFA 70-99%, L ant tib occluded with reconstitution of flow in the dorsalis pedis, bil ABI 1.2; carotid dopp (04-27-08)-normal  . H/O echocardiogram 07/08/2008    EF 40% by 2D '09. EF 60% cath 6/14  . Myocardial infarction   .  GERD (gastroesophageal reflux disease)     Past Surgical History  Procedure Laterality Date  . History of prostatectomy.    . Right knee surgery for dislocation.    . Cabg in 2009.  05/06/2008    x3, LIMA to LAD, SVG to diad, SVG to ramus inermediate  . Cataract surgery.    . Cardiac catheterization  07/04/2011    patent LIMA to diag branch, occluded ben grafts to diag and ramus branch; EF >60%   . Cardiac catheterization  03/20/2008    occluded prox LAD with great collaterals from the ramus and the circ and RCA to LAD; EF 45-50%, med tx  . Abdominal angiogram  10/13/08    LSFA PTA  . Abdominal  angiogram  07/28/2008    RSFA  . Cardiac catheterization  02/05/13    SVGs occluded, no change     (Not in a hospital admission) Allergies  Allergen Reactions  . Acetaminophen Rash    History  Substance Use Topics  . Smoking status: Former Smoker    Quit date: 06/14/1979  . Smokeless tobacco: Never Used  . Alcohol Use: Yes     Comment: occasional wine    Family History  Problem Relation Age of Onset  . Adopted: Yes  . Coronary artery disease Sister   . Coronary artery disease Father 74  . Cancer Mother 34     Objective:  Patient Vitals for the past 8 hrs:  BP Temp Temp src Pulse Resp SpO2  06/04/13 2313 109/72 mmHg 98.3 F (36.8 C) Oral 94 16 99 %  06/04/13 2229 119/69 mmHg - - 102 14 -  06/04/13 2105 128/72 mmHg 99.4 F (37.4 C) Oral 112 17 100 %  06/04/13 2054 - - - - - 94 %   General appearance: alert, cooperative, appears stated age and no distress, thin male Head: Normocephalic, without obvious abnormality, atraumatic Eyes: conjunctivae/corneas clear. PERRL, EOM's intact. Fundi benign. Neck: no JVD Lungs: clear to auscultation bilaterally Chest wall: no tenderness Heart: tachycardic, S1, S2 normal,  Abdomen: soft, non-tender; bowel sounds normal; no masses,  no organomegaly Extremities: extremities normal, atraumatic, no cyanosis or edema Pulses: 2+ and symmetric Neurologic: Grossly normal  Results for orders placed during the hospital encounter of 06/04/13 (from the past 48 hour(s))  CBC WITH DIFFERENTIAL     Status: Abnormal   Collection Time    06/04/13  9:50 PM      Result Value Range   WBC 12.3 (*) 4.0 - 10.5 K/uL   RBC 4.18 (*) 4.22 - 5.81 MIL/uL   Hemoglobin 13.2  13.0 - 17.0 g/dL   HCT 16.1  09.6 - 04.5 %   MCV 93.8  78.0 - 100.0 fL   MCH 31.6  26.0 - 34.0 pg   MCHC 33.7  30.0 - 36.0 g/dL   RDW 40.9  81.1 - 91.4 %   Platelets 203  150 - 400 K/uL   Neutrophils Relative % 81 (*) 43 - 77 %   Neutro Abs 10.0 (*) 1.7 - 7.7 K/uL   Lymphocytes  Relative 10 (*) 12 - 46 %   Lymphs Abs 1.3  0.7 - 4.0 K/uL   Monocytes Relative 9  3 - 12 %   Monocytes Absolute 1.0  0.1 - 1.0 K/uL   Eosinophils Relative 0  0 - 5 %   Eosinophils Absolute 0.0  0.0 - 0.7 K/uL   Basophils Relative 0  0 - 1 %   Basophils Absolute 0.0  0.0 - 0.1 K/uL  BASIC METABOLIC PANEL     Status: Abnormal   Collection Time    06/04/13  9:50 PM      Result Value Range   Sodium 138  135 - 145 mEq/L   Potassium 4.2  3.5 - 5.1 mEq/L   Chloride 101  96 - 112 mEq/L   CO2 27  19 - 32 mEq/L   Glucose, Bld 101 (*) 70 - 99 mg/dL   BUN 17  6 - 23 mg/dL   Creatinine, Ser 1.61  0.50 - 1.35 mg/dL   Calcium 9.2  8.4 - 09.6 mg/dL   GFR calc non Af Amer 50 (*) >90 mL/min   GFR calc Af Amer 58 (*) >90 mL/min   Comment: (NOTE)     The eGFR has been calculated using the CKD EPI equation.     This calculation has not been validated in all clinical situations.     eGFR's persistently <90 mL/min signify possible Chronic Kidney     Disease.  PRO B NATRIURETIC PEPTIDE     Status: Abnormal   Collection Time    06/04/13  9:50 PM      Result Value Range   Pro B Natriuretic peptide (BNP) 1218.0 (*) 0 - 450 pg/mL  POCT I-STAT TROPONIN I     Status: None   Collection Time    06/04/13  9:52 PM      Result Value Range   Troponin i, poc 0.01  0.00 - 0.08 ng/mL   Comment 3            Comment: Due to the release kinetics of cTnI,     a negative result within the first hours     of the onset of symptoms does not rule out     myocardial infarction with certainty.     If myocardial infarction is still suspected,     repeat the test at appropriate intervals.   Dg Chest 2 View  06/04/2013   CLINICAL DATA:  Chest pain  EXAM: CHEST  2 VIEW  COMPARISON:  Prior radiograph from 02/03/2013  FINDINGS: Median sternotomy wires of underlying CABG markers are again noted, unchanged. The cardiac and mediastinal silhouettes are stable in size and contour, and remain within normal limits.  Lungs are well  inflated without pulmonary edema, pleural effusion, or focal infiltrate. Mild left basilar atelectasis/ scarring is stable as compared to the prior exam. No pneumothorax.  Osseous structures are unchanged.  IMPRESSION: No active cardiopulmonary disease.   Electronically Signed   By: Rise Mu M.D.   On: 06/04/2013 22:02    ECG:  Sinus tach HR 116, LVH with 2nd replorization changes, new TWI anteriorly, otherwise no significant changes  Assessment: Chest pain, likely chronic stable angina vs chronic non cardiac chest/back pain Mild CHF HTN HLD  Plan:  1. Cardiology Admission  2. Continuous monitoring on Telemetry. 3. Repeat ekg on admit, prn chest pain or arrythmia 4. Trend cardiac biomarkers r/o MI, check lipids, hgba1c, tsh 5. Medical management to include ASA, BB, Statin, NTG prn 6. Gentle diuresis with IV lasix, monitor I/Os, daily weights 7. TTE in am asses LV function

## 2013-06-04 NOTE — ED Provider Notes (Signed)
CSN: [id number]     Arrival date & time 06/04/13  2051 History   First MD Initiated Contact with Patient 06/04/13 2134     Chief Complaint  Patient presents with  . Chest Pain   (Consider location/radiation/quality/duration/timing/severity/associated sxs/prior Treatment) Patient is a 77 y.o. male presenting with chest pain. The history is provided by the patient. No language interpreter was used.  Chest Pain Pain location:  Substernal area Pain quality: aching   Pain radiates to:  L jaw Pain radiates to the back: no   Pain severity:  Moderate Onset quality:  Sudden Duration:  7 hours Timing:  Constant Progression:  Resolved Chronicity:  New Context comment:  While using leaf blower Relieved by:  Nitroglycerin Worsened by:  Exertion Ineffective treatments:  None tried Associated symptoms: diaphoresis, nausea and shortness of breath   Associated symptoms: no abdominal pain, no back pain, no cough, no dizziness, no dysphagia, no fatigue, no fever, no headache, no numbness, not vomiting and no weakness   Risk factors: coronary artery disease, high cholesterol, hypertension and male sex     Past Medical History  Diagnosis Date  . HTN (hypertension)   . Hyperlipemia   . CAD (coronary artery disease)     CABG 9/09. Cath 2012 and 02/05/13  . Prostate cancer   . PVD (peripheral vascular disease)     LE dopp (10/19/11) patent R SFA stent, R ant tib occluded with reconstitution within the dorsalis pedis, L SFA 70-99%, L ant tib occluded with reconstitution of flow in the dorsalis pedis, bil ABI 1.2; carotid dopp (04-27-08)-normal  . H/O echocardiogram 07/08/2008    EF 40% by 2D '09. EF 60% cath 6/14  . Myocardial infarction   . GERD (gastroesophageal reflux disease)    Past Surgical History  Procedure Laterality Date  . History of prostatectomy.    . Right knee surgery for dislocation.    . Cabg in 2009.  05/06/2008    x3, LIMA to LAD, SVG to diad, SVG to ramus inermediate  .  Cataract surgery.    . Cardiac catheterization  07/04/2011    patent LIMA to diag branch, occluded ben grafts to diag and ramus branch; EF >60%   . Cardiac catheterization  03/20/2008    occluded prox LAD with great collaterals from the ramus and the circ and RCA to LAD; EF 45-50%, med tx  . Abdominal angiogram  10/13/08    LSFA PTA  . Abdominal angiogram  07/28/2008    RSFA  . Cardiac catheterization  02/05/13    SVGs occluded, no change   Family History  Problem Relation Age of Onset  . Adopted: Yes  . Coronary artery disease Sister   . Coronary artery disease Father 26  . Cancer Mother 98   History  Substance Use Topics  . Smoking status: Former Smoker    Quit date: 06/14/1979  . Smokeless tobacco: Never Used  . Alcohol Use: Yes     Comment: occasional wine    Review of Systems  Constitutional: Positive for diaphoresis. Negative for fever, activity change, appetite change and fatigue.  HENT: Negative for congestion, facial swelling, rhinorrhea and trouble swallowing.   Eyes: Negative for photophobia and pain.  Respiratory: Positive for shortness of breath. Negative for cough and chest tightness.   Cardiovascular: Positive for chest pain. Negative for leg swelling.  Gastrointestinal: Positive for nausea. Negative for vomiting, abdominal pain, diarrhea and constipation.  Endocrine: Negative for polydipsia and polyuria.  Genitourinary: Negative for dysuria,  urgency, decreased urine volume and difficulty urinating.  Musculoskeletal: Negative for back pain and gait problem.  Skin: Negative for color change, rash and wound.  Allergic/Immunologic: Negative for immunocompromised state.  Neurological: Negative for dizziness, facial asymmetry, speech difficulty, weakness, numbness and headaches.  Psychiatric/Behavioral: Negative for confusion, decreased concentration and agitation.    Allergies  Acetaminophen  Home Medications   Current Outpatient Rx  Name  Route  Sig   Dispense  Refill  . isosorbide mononitrate (IMDUR) 30 MG 24 hr tablet   Oral   Take 1 tablet (30 mg total) by mouth daily.   30 tablet   6   . metoprolol (LOPRESSOR) 50 MG tablet   Oral   Take 1 tablet (50 mg total) by mouth 2 (two) times daily.   60 tablet   6    BP 127/81  Pulse 94  Temp(Src) 99 F (37.2 C) (Oral)  Resp 18  Ht 5\' 10"  (1.778 m)  Wt 113 lb 12.8 oz (51.619 kg)  BMI 16.33 kg/m2  SpO2 100% Physical Exam  Constitutional: He is oriented to person, place, and time. He appears well-developed and well-nourished. No distress.  HENT:  Head: Normocephalic and atraumatic.  Mouth/Throat: No oropharyngeal exudate.  Eyes: Pupils are equal, round, and reactive to light.  Neck: Normal range of motion. Neck supple.  Cardiovascular: Normal rate, regular rhythm and normal heart sounds.  Exam reveals no gallop and no friction rub.   No murmur heard. Pulmonary/Chest: Effort normal and breath sounds normal. No respiratory distress. He has no wheezes. He has no rales.  Abdominal: Soft. Bowel sounds are normal. He exhibits no distension and no mass. There is no tenderness. There is no rebound and no guarding.  Musculoskeletal: Normal range of motion. He exhibits no edema and no tenderness.  Neurological: He is alert and oriented to person, place, and time.  Skin: Skin is warm and dry.  Psychiatric: He has a normal mood and affect.    ED Course  Procedures (including critical care time) Labs Review Labs Reviewed  CBC WITH DIFFERENTIAL - Abnormal; Notable for the following:    WBC 12.3 (*)    RBC 4.18 (*)    Neutrophils Relative % 81 (*)    Neutro Abs 10.0 (*)    Lymphocytes Relative 10 (*)    All other components within normal limits  BASIC METABOLIC PANEL - Abnormal; Notable for the following:    Glucose, Bld 101 (*)    GFR calc non Af Amer 50 (*)    GFR calc Af Amer 58 (*)    All other components within normal limits  PRO B NATRIURETIC PEPTIDE - Abnormal; Notable for  the following:    Pro B Natriuretic peptide (BNP) 1218.0 (*)    All other components within normal limits  CBC - Abnormal; Notable for the following:    WBC 11.1 (*)    RBC 4.06 (*)    Hemoglobin 12.7 (*)    HCT 38.0 (*)    All other components within normal limits  BASIC METABOLIC PANEL - Abnormal; Notable for the following:    GFR calc non Af Amer 55 (*)    GFR calc Af Amer 64 (*)    All other components within normal limits  GLUCOSE, CAPILLARY - Abnormal; Notable for the following:    Glucose-Capillary 67 (*)    All other components within normal limits  GLUCOSE, CAPILLARY - Abnormal; Notable for the following:    Glucose-Capillary 139 (*)  All other components within normal limits  GLUCOSE, CAPILLARY - Abnormal; Notable for the following:    Glucose-Capillary 149 (*)    All other components within normal limits  TROPONIN I  TROPONIN I  MAGNESIUM  PROTIME-INR  LIPID PANEL  TSH  HEMOGLOBIN A1C  URINALYSIS, ROUTINE W REFLEX MICROSCOPIC  POCT I-STAT TROPONIN I   Imaging Review Dg Chest 2 View  06/04/2013   CLINICAL DATA:  Chest pain  EXAM: CHEST  2 VIEW  COMPARISON:  Prior radiograph from 02/03/2013  FINDINGS: Median sternotomy wires of underlying CABG markers are again noted, unchanged. The cardiac and mediastinal silhouettes are stable in size and contour, and remain within normal limits.  Lungs are well inflated without pulmonary edema, pleural effusion, or focal infiltrate. Mild left basilar atelectasis/ scarring is stable as compared to the prior exam. No pneumothorax.  Osseous structures are unchanged.  IMPRESSION: No active cardiopulmonary disease.   Electronically Signed   By: Rise Mu M.D.   On: 06/04/2013 22:02    EKG Interpretation     Ventricular Rate:  116 PR Interval:  142 QRS Duration: 77 QT Interval:  298 QTC Calculation: 416 R Axis:   58 Text Interpretation:  Sinus tachycardia Left atrial enlargement Probable LVH with secondary repol  abnrm, new TWI V2, V3 STdepression V5, V6            MDM   1. CAD (coronary artery disease)   2. Chest pain   3. HTN (hypertension)   4. Hyperlipemia    Pt is a 77 y.o. male with Pmhx as above who presents with CP beginning around 2pm (about 7 hrs ago) while using leaf blower with associated SOB, nausea, diaphoresis.  Pt felt improved after ASA & 2 SL NTG by EMS and is no longer having CP.  EKG w/ new TWI as above.  First trop negative. CXR w/o acute findings. BNP elevated at 1218.  Given high risk features, EKG changes cardiology consulted, will admit for ACS r/o.          Shanna Cisco, MD 06/05/13 [phone]

## 2013-06-04 NOTE — ED Notes (Signed)
Pt c/o L sided CP, radiating into L jaw, pt from home, arrived via EMS rcvd 324 mg ASA & x2 SL nitro pta with symptom relief with nitro, pt c/o SOB, placed on 10 L non rebreather d/t poor  peripheral circulation per EMS, sats 94 on RA, 96 on O2, symptom onset @ 20:00 while blowing leaves, pt hx of MI & CABG 2009, pt A&O x4, follows commands, speaks in complete sentences

## 2013-06-05 ENCOUNTER — Encounter (HOSPITAL_COMMUNITY): Payer: Self-pay | Admitting: Cardiology

## 2013-06-05 DIAGNOSIS — R079 Chest pain, unspecified: Secondary | ICD-10-CM

## 2013-06-05 DIAGNOSIS — E785 Hyperlipidemia, unspecified: Secondary | ICD-10-CM

## 2013-06-05 DIAGNOSIS — I2510 Atherosclerotic heart disease of native coronary artery without angina pectoris: Secondary | ICD-10-CM

## 2013-06-05 DIAGNOSIS — N390 Urinary tract infection, site not specified: Secondary | ICD-10-CM

## 2013-06-05 DIAGNOSIS — R9431 Abnormal electrocardiogram [ECG] [EKG]: Secondary | ICD-10-CM

## 2013-06-05 DIAGNOSIS — I10 Essential (primary) hypertension: Secondary | ICD-10-CM

## 2013-06-05 HISTORY — DX: Abnormal electrocardiogram (ECG) (EKG): R94.31

## 2013-06-05 HISTORY — DX: Urinary tract infection, site not specified: N39.0

## 2013-06-05 LAB — GLUCOSE, CAPILLARY: Glucose-Capillary: 67 mg/dL — ABNORMAL LOW (ref 70–99)

## 2013-06-05 LAB — BASIC METABOLIC PANEL
BUN: 17 mg/dL (ref 6–23)
CO2: 25 mEq/L (ref 19–32)
Calcium: 9 mg/dL (ref 8.4–10.5)
GFR calc Af Amer: 64 mL/min — ABNORMAL LOW (ref >90)
GFR calc non Af Amer: 55 mL/min — ABNORMAL LOW (ref >90)
Potassium: 4.1 mEq/L (ref 3.5–5.1)
Sodium: 139 mEq/L (ref 135–145)

## 2013-06-05 LAB — PROTIME-INR: INR: 1.04 (ref 0.00–1.49)

## 2013-06-05 LAB — LIPID PANEL
Cholesterol: 137 mg/dL (ref 0–200)
HDL: 56 mg/dL (ref >39)
LDL Cholesterol: 68 mg/dL (ref 0–99)
Total CHOL/HDL Ratio: 2.4 RATIO
VLDL: 13 mg/dL (ref 0–40)

## 2013-06-05 LAB — URINALYSIS, ROUTINE W REFLEX MICROSCOPIC
Ketones, ur: NEGATIVE mg/dL
Nitrite: NEGATIVE
Specific Gravity, Urine: 1.016 (ref 1.005–1.030)
Urobilinogen, UA: 1 mg/dL (ref 0.0–1.0)
pH: 6.5 (ref 5.0–8.0)

## 2013-06-05 LAB — MAGNESIUM: Magnesium: 2.2 mg/dL (ref 1.5–2.5)

## 2013-06-05 LAB — CBC
MCH: 31.3 pg (ref 26.0–34.0)
MCHC: 33.4 g/dL (ref 30.0–36.0)
RDW: 13.4 % (ref 11.5–15.5)

## 2013-06-05 LAB — TSH: TSH: 3.052 u[IU]/mL (ref 0.350–4.500)

## 2013-06-05 LAB — URINE MICROSCOPIC-ADD ON

## 2013-06-05 LAB — TROPONIN I: Troponin I: <0.3 ng/mL (ref <0.30)

## 2013-06-05 LAB — HEMOGLOBIN A1C: Hgb A1c MFr Bld: 6.3 % — ABNORMAL HIGH (ref <5.7)

## 2013-06-05 MED ORDER — POLYVINYL ALCOHOL 1.4 % OP SOLN
1.0000 [drp] | OPHTHALMIC | Status: DC | PRN
  Filled 2013-06-05: qty 15

## 2013-06-05 MED ORDER — PREDNISONE 5 MG PO TABS
7.5000 mg | ORAL_TABLET | Freq: Every day | ORAL | Status: DC
  Administered 2013-06-05: 11:00:00 7.5 mg via ORAL
  Filled 2013-06-05: qty 1

## 2013-06-05 MED ORDER — METOPROLOL TARTRATE 25 MG PO TABS
25.0000 mg | ORAL_TABLET | Freq: Two times a day (BID) | ORAL | Status: DC
  Administered 2013-06-05: 25 mg via ORAL
  Filled 2013-06-05 (×2): qty 1

## 2013-06-05 MED ORDER — POLYETHYL GLYCOL-PROPYL GLYCOL 0.4-0.3 % OP SOLN: 1.0000 [drp] | Freq: Two times a day (BID) | OPHTHALMIC | Status: DC | PRN

## 2013-06-05 MED ORDER — ASPIRIN EC 81 MG PO TBEC
81.0000 mg | DELAYED_RELEASE_TABLET | Freq: Every day | ORAL | Status: DC
  Administered 2013-06-05: 81 mg via ORAL
  Filled 2013-06-05: qty 1

## 2013-06-05 MED ORDER — VITAMIN D3 25 MCG (1000 UNIT) PO TABS
1000.0000 [IU] | ORAL_TABLET | Freq: Every day | ORAL | Status: DC
  Administered 2013-06-05: 1000 [IU] via ORAL
  Filled 2013-06-05: qty 1

## 2013-06-05 MED ORDER — PANTOPRAZOLE SODIUM 40 MG PO TBEC
40.0000 mg | DELAYED_RELEASE_TABLET | Freq: Two times a day (BID) | ORAL | Status: DC | PRN
Start: 2013-06-05 — End: 2013-06-05

## 2013-06-05 MED ORDER — NITROGLYCERIN 0.4 MG SL SUBL: 0.4000 mg | SUBLINGUAL_TABLET | SUBLINGUAL | Status: DC | PRN

## 2013-06-05 MED ORDER — MIRTAZAPINE 15 MG PO TABS
15.0000 mg | ORAL_TABLET | Freq: Every day | ORAL | Status: DC
Start: 2013-06-05 — End: 2013-06-05
  Filled 2013-06-05: qty 1

## 2013-06-05 MED ORDER — CIPROFLOXACIN HCL 250 MG PO TABS: 250.0000 mg | ORAL_TABLET | Freq: Two times a day (BID) | ORAL | Status: DC

## 2013-06-05 MED ORDER — METOPROLOL TARTRATE 50 MG PO TABS: 50.0000 mg | ORAL_TABLET | Freq: Two times a day (BID) | ORAL | Status: DC

## 2013-06-05 MED ORDER — SODIUM CHLORIDE 0.9 % IV SOLN: 250.0000 mL | INTRAVENOUS | Status: DC | PRN

## 2013-06-05 MED ORDER — DEXTROSE 50 % IV SOLN
INTRAVENOUS | Status: AC
  Administered 2013-06-05: 25 mL
  Filled 2013-06-05: qty 50

## 2013-06-05 MED ORDER — INSULIN ASPART 100 UNIT/ML ~~LOC~~ SOLN: 0.0000 [IU] | Freq: Three times a day (TID) | SUBCUTANEOUS | Status: DC

## 2013-06-05 MED ORDER — FOLIC ACID 1 MG PO TABS
1.0000 mg | ORAL_TABLET | ORAL | Status: DC
  Administered 2013-06-05: 1 mg via ORAL
  Filled 2013-06-05: qty 1

## 2013-06-05 MED ORDER — HEPARIN SODIUM (PORCINE) 5000 UNIT/ML IJ SOLN
5000.0000 [IU] | Freq: Three times a day (TID) | INTRAMUSCULAR | Status: DC
  Administered 2013-06-05: 5000 [IU] via SUBCUTANEOUS
  Filled 2013-06-05 (×4): qty 1

## 2013-06-05 MED ORDER — OXYBUTYNIN CHLORIDE 5 MG PO TABS
5.0000 mg | ORAL_TABLET | Freq: Two times a day (BID) | ORAL | Status: DC
  Administered 2013-06-05: 5 mg via ORAL
  Filled 2013-06-05 (×2): qty 1

## 2013-06-05 MED ORDER — SODIUM CHLORIDE 0.9 % IJ SOLN: 3.0000 mL | Freq: Two times a day (BID) | INTRAMUSCULAR | Status: DC

## 2013-06-05 MED ORDER — FUROSEMIDE 10 MG/ML IJ SOLN
20.0000 mg | Freq: Once | INTRAMUSCULAR | Status: AC
  Administered 2013-06-05: 20 mg via INTRAVENOUS
  Filled 2013-06-05: qty 2

## 2013-06-05 MED ORDER — ONDANSETRON HCL 4 MG/2ML IJ SOLN: 4.0000 mg | Freq: Four times a day (QID) | INTRAMUSCULAR | Status: DC | PRN

## 2013-06-05 MED ORDER — METOPROLOL TARTRATE 50 MG PO TABS
50.0000 mg | ORAL_TABLET | Freq: Two times a day (BID) | ORAL | Status: DC
  Filled 2013-06-05: qty 1

## 2013-06-05 MED ORDER — ISOSORBIDE MONONITRATE ER 30 MG PO TB24: 30.0000 mg | ORAL_TABLET | Freq: Every day | ORAL | Status: DC

## 2013-06-05 MED ORDER — METHOTREXATE 2.5 MG PO TABS: 7.5000 mg | ORAL_TABLET | ORAL | Status: DC

## 2013-06-05 MED ORDER — SIMVASTATIN 20 MG PO TABS
20.0000 mg | ORAL_TABLET | Freq: Every day | ORAL | Status: DC
  Filled 2013-06-05: qty 1

## 2013-06-05 MED ORDER — ISOSORBIDE MONONITRATE ER 30 MG PO TB24
30.0000 mg | ORAL_TABLET | Freq: Every day | ORAL | Status: DC
  Administered 2013-06-05: 30 mg via ORAL
  Filled 2013-06-05: qty 1

## 2013-06-05 MED ORDER — CIPROFLOXACIN HCL 250 MG PO TABS
250.0000 mg | ORAL_TABLET | Freq: Two times a day (BID) | ORAL | Status: DC
  Filled 2013-06-05 (×2): qty 1

## 2013-06-05 MED ORDER — SODIUM CHLORIDE 0.9 % IJ SOLN: 3.0000 mL | INTRAMUSCULAR | Status: DC | PRN

## 2013-06-05 NOTE — Progress Notes (Addendum)
Subjective:   Objective: Vital signs in last 24 hours: Temp:  [98.3 F (36.8 C)-99.4 F (37.4 C)] 99 F (37.2 C) (10/23 0603) Pulse Rate:  [88-112] 94 (10/23 0603) Resp:  [14-18] 18 (10/23 0603) BP: (109-132)/(69-83) 127/81 mmHg (10/23 0603) SpO2:  [94 %-100 %] 100 % (10/23 0603) Weight:  [113 lb 12.8 oz (51.619 kg)] 113 lb 12.8 oz (51.619 kg) (10/23 0603) Weight change:  Last BM Date: 06/04/13 Intake/Output from previous day:   Intake/Output this shift:    PE:  Per Dr. [name] General:Pleasant affect, NAD Skin:Warm and dry, brisk capillary refill HEENT:normocephalic, sclera clear, mucus membranes moist Heart:S1S2 RRR without murmur, gallup, rub or click Lungs:clear without rales, rhonchi, or wheezes ZOX:WRUE, non tender, + BS, do not palpate liver spleen or masses Ext:no lower ext edema, 2+ pedal pulses, 2+ radial pulses Neuro:alert and oriented, MAE, follows commands, + facial symmetry   Lab Results:  Recent Labs  06/04/13 2150 06/05/13 0203  WBC 12.3* 11.1*  HGB 13.2 12.7*  HCT 39.2 38.0*  PLT 203 214   BMET  Recent Labs  06/04/13 2150 06/05/13 0203  NA 138 139  K 4.2 4.1  CL 101 103  CO2 27 25  GLUCOSE 101* 97  BUN 17 17  CREATININE 1.35 1.24  CALCIUM 9.2 9.0    Recent Labs  06/05/13 0114 06/05/13 0750  TROPONINI <0.30 <0.30    Lab Results  Component Value Date   CHOL 137 06/05/2013   HDL 56 06/05/2013   LDLCALC 68 06/05/2013   TRIG 64 06/05/2013   CHOLHDL 2.4 06/05/2013   Lab Results  Component Value Date   HGBA1C  Value: 6.6 (NOTE) The ADA recommends the following therapeutic goal for glycemic control related to Hgb A1c measurement: Goal of therapy: <6.5 Hgb A1c  Reference: American Diabetes Association: Clinical Practice Recommendations 2010, Diabetes Care, 2010, 33: (Suppl  1).* 10/02/2009       Hepatic Function Panel No results found for this basename: PROT, ALBUMIN, AST, ALT, ALKPHOS, BILITOT, BILIDIR, IBILI,  in  the last 72 hours  Recent Labs  06/05/13 0203  CHOL 137   No results found for this basename: PROTIME,  in the last 72 hours      Studies/Results: Dg Chest 2 View  06/04/2013   CLINICAL DATA:  Chest pain  EXAM: CHEST  2 VIEW  COMPARISON:  Prior radiograph from 02/03/2013  FINDINGS: Median sternotomy wires of underlying CABG markers are again noted, unchanged. The cardiac and mediastinal silhouettes are stable in size and contour, and remain within normal limits.  Lungs are well inflated without pulmonary edema, pleural effusion, or focal infiltrate. Mild left basilar atelectasis/ scarring is stable as compared to the prior exam. No pneumothorax.  Osseous structures are unchanged.  IMPRESSION: No active cardiopulmonary disease.   Electronically Signed   By: Rise Mu M.D.   On: 06/04/2013 22:02    Medications: I have reviewed the patient's current medications. Scheduled Meds: . aspirin EC  81 mg Oral Daily  . cholecalciferol  1,000 Units Oral Daily  . folic acid  1 mg Oral Custom  . heparin  5,000 Units Subcutaneous Q8H  . insulin aspart  0-9 Units Subcutaneous TID WC  . [START ON 06/07/2013] methotrexate  7.5 mg Oral Q Sat  . metoprolol  25 mg Oral BID  . mirtazapine  15 mg Oral QHS  . nitroGLYCERIN  0.5 inch Topical Q6H  . oxybutynin  5 mg  Oral BID  . predniSONE  7.5 mg Oral Daily  . simvastatin  20 mg Oral q1800  . sodium chloride  3 mL Intravenous Q12H   Continuous Infusions:  PRN Meds:.sodium chloride, nitroGLYCERIN, ondansetron (ZOFRAN) IV, pantoprazole, polyvinyl alcohol, sodium chloride  Assessment/Plan: Principal Problem:   Chest pain Active Problems:   HTN (hypertension)   Hyperlipemia   CAD, CABG X 3 9/09. Cath 2012 and 02/05/13- 2/3 grafts occluded- medical Rx  Plan:  Pt presented with chest pain while blowing leaves in his yard. Troponin neg. BNP elevated no old one to compare.   CXR clear, elevated WBC. NPO for cath-depending on Dr. Hazle Coca  decision.   LOS: 1 day   Time spent with pt. :15 minutes. [facility] R  Nurse Practitioner Certified Pager [phone] 06/05/2013, 9:19 AM   Agree with note written by Nada Boozer RNP  Pt well known to me. I cathed him in June and thought that his anatomy was stable , rec med Rx. Admitted last PM with CP and weakness. Currently pain free. Exam benign. Enz neg. BNP mildly elevated. EKG shows ST with anterior TWI (? Rate related). He has not had this EKG change on prior EKGs at slower rate. Will re check EKG. Needs med adjustment, increase BB, add low dose nitrate. Possibly home today with TCM 7.  N.B. F/U EKG shows normalization of TWI at slower HR. OK for D/C with Med adjustment , TCM 7  Lety Cullens J 06/05/2013 9:45 AM

## 2013-06-05 NOTE — Progress Notes (Signed)
UR Completed Normalee Sistare Graves-Bigelow, RN,BSN [phone]  

## 2013-06-05 NOTE — Discharge Summary (Signed)
Physician Discharge Summary       Patient ID: Tony Curtis MRN: [id number] DOB/AGE: 1936/01/06 77 y.o.  Admit date: 06/04/2013 Discharge date: 06/05/2013  Discharge Diagnoses:  Principal Problem:   Chest pain, negative MI, adjusted meds Active Problems:   HTN (hypertension)   Hyperlipemia   CAD, CABG X 3 9/09. Cath 2012 and 02/05/13- 2/3 grafts occluded- medical Rx   Abnormal EKG, resolved with slower HR   UTI (urinary tract infection)   Discharged Condition: good  Procedures: none  Hospital Course:  Tony Curtis is a 77 y.o. male who presented for evaluation of chest pain. He has hx of CAD s/p CABG with 2/3 grafts occluded s/p Cath in 01/2013 revealing stable anatomy with preserved LV function. He was out blowing leaves 06/04/13 at home when he felt weak and developed midsternal pain. Pain was 9/10. No other associated symptoms. Did not take NTG at home. He attempted to go the work that evening, but pain did not go away. He was urged to call EMS. He was given ASA and NTG. He was pain free in the ER and has remained pain free. He has negative trop and elevated BNP. He was given 20 mg IV lasix IV and NTG paste.  EKG with ST at 116 and new TWI ant. Leads.  He was admitted for further eval.    This AM his troponins are negative and repeat EKG reveals resolution of TWI-present mosat likely due to tachycardia.  No chest pain.  Does have low grade fever, CXR was clear.  WBC was elevated.  Will check urine for infection. Pt did have flu vaccine and PNA vaccine on Monday.   (prior to discharge U/a + leukocytes, + hgb, neg. Nitrites.)  cipro 250 mg BID for 3 days added to medications.  We increased BB and added Imdur.  If he ambulates without pain he will be discharged with close follow up.. Last cath and Nuc study in June:  Cath 01/2013:  1. Left main; 50% tapered distal  2. LAD; occluded at its origin  3. Left circumflex; nondominant with a 75% ostial OM1 stenosis. The ramus branch  which arose from the left main had 50-60% segmental ostial/proximal stenosis unchanged from prior cath.  4. Right coronary artery; comment the small and free of significant disease  5.LIMA TO LAD; widely patent  6. SVG TO ramus branch was occluded at the aorta and prior cath  SVG TO PDA was occluded at the aorta from prior cath  7. Left ventriculography; RAO left ventriculogram was performed using  25 mL of Visipaque dye at 12 mL/second. The overall LVEF estimated  60 % Without wall motion abnormalities   IMPRESSION:Mr. Marland has unchanged anatomy from his prior cath 2 years ago. He is a tapered to present to the left main into a ramus branch and circumflex. The ramus branch has proximal disease that is moderate in degree and unchanged from prior cath in the right coronary artery is otherwise unchanged. His LV function is normal. I do not think his chest pain is ischemic. Continue medical therapy will be recommended.  Stress test 01/2013:  Findings: The exam was stopped after rest images were obtained and  stress images were not obtained as part of this study.  The rest images show a small focus of decreased activity in the  distal anteroseptal wall compatible with a small infarct or area of  attenuation. Left ventricular cavity size appears grossly normal.  IMPRESSION:  Small scar or  infarct in the distal anteroseptal wall. Dr. [name] saw and found pt stable for discharge.  Also while the BNP was elevated I have none to compare to.  He had no symptoms of heart failure.     Consults: None  Significant Diagnostic Studies:  BMET    Component Value Date/Time   NA 139 06/05/2013 0203   K 4.1 06/05/2013 0203   CL 103 06/05/2013 0203   CO2 25 06/05/2013 0203   GLUCOSE 97 06/05/2013 0203   BUN 17 06/05/2013 0203   CREATININE 1.24 06/05/2013 0203   CALCIUM 9.0 06/05/2013 0203   GFRNONAA 55* 06/05/2013 0203   GFRAA 64* 06/05/2013 0203    CBC    Component Value Date/Time   WBC 11.1*  06/05/2013 0203   RBC 4.06* 06/05/2013 0203   HGB 12.7* 06/05/2013 0203   HCT 38.0* 06/05/2013 0203   PLT 214 06/05/2013 0203   MCV 93.6 06/05/2013 0203   MCH 31.3 06/05/2013 0203   MCHC 33.4 06/05/2013 0203   RDW 13.4 06/05/2013 0203   LYMPHSABS 1.3 06/04/2013 2150   MONOABS 1.0 06/04/2013 2150   EOSABS 0.0 06/04/2013 2150   BASOSABS 0.0 06/04/2013 2150   Troponin neg X 3  Hgb A1C 6.3 TSH  6.3  BNP (last 3 results)  Recent Labs  06/04/13 2150  PROBNP 1218.0*     Discharge Exam: Blood pressure 117/66, pulse 86, temperature 98.1 F (36.7 C), temperature source Oral, resp. rate 16, height 5\' 10"  (1.778 m), weight 113 lb 12.8 oz (51.619 kg), SpO2 98.00%.  AM exam :PE: Per Dr. [name]  General:Pleasant affect, NAD  Skin:Warm and dry, brisk capillary refill  HEENT:normocephalic, sclera clear, mucus membranes moist  Heart:S1S2 RRR without murmur, gallup, rub or click  Lungs:clear without rales, rhonchi, or wheezes  XBJ:YNWG, non tender, + BS, do not palpate liver spleen or masses  Ext:no lower ext edema, 2+ pedal pulses, 2+ radial pulses  Neuro:alert and oriented, MAE, follows commands, + facial symmetry   Disposition: 01-Home or Self Care       Future Appointments Provider Department Dept Phone   06/06/2013 2:20 PM Nada Boozer, NP [facility] [phone]       Medication List         aspirin EC 81 MG tablet  Take 81 mg by mouth daily.     cholecalciferol 1000 UNITS tablet  Commonly known as:  VITAMIN D  Take 1,000 Units by mouth daily.     ciprofloxacin 250 MG tablet  Commonly known as:  CIPRO  Take 1 tablet (250 mg total) by mouth 2 (two) times daily.     folic acid 1 MG tablet  Commonly known as:  FOLVITE  Take 1 mg by mouth See admin instructions. Take on every day except Saturdays (don't take with Methotrexate)     isosorbide mononitrate 30 MG 24 hr tablet  Commonly known as:  IMDUR  Take 1 tablet (30 mg total) by mouth daily.      methotrexate 2.5 MG tablet  Commonly known as:  RHEUMATREX  Take 12.5 mg by mouth once a week. Caution:Chemotherapy. Protect from light.  Take 5 tablets (12.5mg ) on Saturdays     metoprolol 50 MG tablet  Commonly known as:  LOPRESSOR  Take 1 tablet (50 mg total) by mouth 2 (two) times daily.     mirtazapine 15 MG tablet  Commonly known as:  REMERON  Take 15 mg by mouth at bedtime.     nitroGLYCERIN 0.4  MG SL tablet  Commonly known as:  NITROSTAT  Place 0.4 mg under the tongue every 5 (five) minutes as needed for chest pain.     oxybutynin 5 MG tablet  Commonly known as:  DITROPAN  Take 5 mg by mouth 2 (two) times daily.     pantoprazole 40 MG tablet  Commonly known as:  PROTONIX  Take 40 mg by mouth 2 (two) times daily as needed (heartburn).     pravastatin 40 MG tablet  Commonly known as:  PRAVACHOL  Take 40 mg by mouth at bedtime.     predniSONE 5 MG tablet  Commonly known as:  DELTASONE  Take 5 mg by mouth daily with breakfast.     PRESCRIPTION MEDICATION  Apply 1 application topically at bedtime as needed (for back and neck pain). [facility]     SYSTANE 0.4-0.3 % Soln  Generic drug:  Polyethyl Glycol-Propyl Glycol  Place 1 drop into both eyes 4 (four) times daily as needed (dry eyes).     traMADol 50 MG tablet  Commonly known as:  ULTRAM  Take 100 mg by mouth 3 (three) times daily as needed for pain. Maximum dose= 8 tablets per day       Follow-up Information   Follow up with Corine Shelter K, PA-C. (our office will call you with date and time)    Specialty:  Cardiology   Contact information:   [address] [place] [address] [phone]        Discharge Instructions:For recurrent chest pain call or office or return to ER.  Heart Healthy diabetic diet.  We adjusted your medications.  We increased your Lopressor and added Isosorbide mononitrate to help prevent pain.    Signed: Leone Brand Nurse  Practitioner-Certified [facility]: [facility] 06/05/2013, 5:54 PM  Time spent on discharge :35 minutes.

## 2013-06-05 NOTE — ED Notes (Signed)
Report given to 3 west unit nurse , pt. denies chest pain at this time , respirations unlabored , IV site unremarkable .

## 2013-06-06 ENCOUNTER — Ambulatory Visit: Payer: Medicare Other | Admitting: Cardiology

## 2013-06-06 ENCOUNTER — Telehealth: Payer: Self-pay | Admitting: Cardiology

## 2013-06-06 NOTE — Telephone Encounter (Signed)
TCM PHONE CALL-  pT CALLED, NO FEVERS, 1 EPISODE OF CHEST PAIN THIS AM. NO ASSOCIATED SYMPTOMS,  HE DID HAVE HIS MEDS FILLED AND IS TAKING THEM.

## 2013-06-07 LAB — URINE CULTURE

## 2013-06-11 ENCOUNTER — Ambulatory Visit (INDEPENDENT_AMBULATORY_CARE_PROVIDER_SITE_OTHER): Payer: Medicare Other | Admitting: Cardiology

## 2013-06-11 ENCOUNTER — Encounter: Payer: Self-pay | Admitting: Cardiology

## 2013-06-11 VITALS — BP 132/80 | HR 88 | Ht 70.0 in | Wt 118.0 lb

## 2013-06-11 DIAGNOSIS — I10 Essential (primary) hypertension: Secondary | ICD-10-CM

## 2013-06-11 DIAGNOSIS — I2510 Atherosclerotic heart disease of native coronary artery without angina pectoris: Secondary | ICD-10-CM

## 2013-06-11 DIAGNOSIS — E785 Hyperlipidemia, unspecified: Secondary | ICD-10-CM

## 2013-06-11 DIAGNOSIS — E43 Unspecified severe protein-calorie malnutrition: Secondary | ICD-10-CM

## 2013-06-11 DIAGNOSIS — R079 Chest pain, unspecified: Secondary | ICD-10-CM

## 2013-06-11 DIAGNOSIS — C61 Malignant neoplasm of prostate: Secondary | ICD-10-CM

## 2013-06-11 DIAGNOSIS — N390 Urinary tract infection, site not specified: Secondary | ICD-10-CM

## 2013-06-11 NOTE — Patient Instructions (Signed)
Have urine sample taken today. [facility] We will see you in three months.

## 2013-06-11 NOTE — Assessment & Plan Note (Signed)
TCM follow up visit today

## 2013-06-11 NOTE — Assessment & Plan Note (Signed)
Surgery 2002 Dr Annabell Howells

## 2013-06-11 NOTE — Progress Notes (Signed)
06/11/2013 Tony Curtis   09/17/1935  [id number]  Primary Physicia [name]., MD Primary Cardiologist: Dr [name]  HPI:  77 y/o [place] war vet followed by Dr [name] and the [place] in [place] (Dr [name]). He had CABG X 3 in 2009. He has some type of chronic fatigue, malnutrition syndrome and is on chronic steroids. He was admitted in June 2014 for chest pain and had a cath which revealed an occluded SVG-RI, and an occluded SVG-PDA. This is unchanged from his previous cath from 2012. His EF at cath was 60% and this is actually improved from a prior echo that showed his EF to 40% in 2009. The plan is for continued medical RX.           He was recently admitted after an episode of fatigue and near syncope with an abnormal EKG felt to be due to tachycardia. His Troponin's were negative and his medications were adjusted. In retrospect his symptoms were probably secondary to a Klebsiella UTI that was treated with Cipro at discharge. He is here today for a TCM follow up office visit. Since discharge he has been stable. He has been back to work but wants to pursue getting disability. From a cardiac standpoint this does not seem unreasonable with his diffuse CAD. I suggested he follow up with his primary physicians at the [facility] and indicated we would help any way we can.    Current Outpatient Prescriptions  Medication Sig Dispense Refill  . aspirin EC 81 MG tablet Take 81 mg by mouth daily.      . cholecalciferol (VITAMIN D) 1000 UNITS tablet Take 1,000 Units by mouth daily.      . folic acid (FOLVITE) 1 MG tablet Take 1 mg by mouth See admin instructions. Take on every day except Saturdays (don't take with Methotrexate)      . isosorbide mononitrate (IMDUR) 30 MG 24 hr tablet Take 1 tablet (30 mg total) by mouth daily.  30 tablet  6  . methotrexate (RHEUMATREX) 2.5 MG tablet Take 12.5 mg by mouth once a week. Caution:Chemotherapy. Protect from light.  Take 5 tablets (12.5mg ) on Saturdays      .  metoprolol (LOPRESSOR) 50 MG tablet Take 1 tablet (50 mg total) by mouth 2 (two) times daily.  60 tablet  6  . mirtazapine (REMERON) 15 MG tablet Take 15 mg by mouth at bedtime.      . nitroGLYCERIN (NITROSTAT) 0.4 MG SL tablet Place 0.4 mg under the tongue every 5 (five) minutes as needed for chest pain.      Marland Kitchen oxybutynin (DITROPAN) 5 MG tablet Take 5 mg by mouth 2 (two) times daily.       . pantoprazole (PROTONIX) 40 MG tablet Take 40 mg by mouth 2 (two) times daily as needed (heartburn).       Bertram Gala Glycol-Propyl Glycol (SYSTANE) 0.4-0.3 % SOLN Place 1 drop into both eyes 4 (four) times daily as needed (dry eyes).      . pravastatin (PRAVACHOL) 40 MG tablet Take 40 mg by mouth at bedtime.      . predniSONE (DELTASONE) 5 MG tablet Take 5 mg by mouth daily with breakfast.      . PRESCRIPTION MEDICATION Apply 1 application topically at bedtime as needed (for back and neck pain). Medicated cream from [place]      . traMADol (ULTRAM) 50 MG tablet Take 100 mg by mouth 3 (three) times daily as needed for pain. Maximum dose= 8 tablets  per day       No current facility-administered medications for this visit.    Allergies  Allergen Reactions  . Acetaminophen Rash    History   Social History  . Marital Status: Married    Spouse Name: N/A    Number of Children: N/A  . Years of Education: N/A   Occupational History  . Not on file.   Social History Main Topics  . Smoking status: Former Smoker    Quit date: 06/14/1979  . Smokeless tobacco: Never Used  . Alcohol Use: Yes     Comment: occasional wine  . Drug Use: No  . Sexual Activity: Not on file   Other Topics Concern  . Not on file   Social History Narrative  . No narrative on file     Review of Systems: General: negative for chills, fever, night sweats or weight changes.  Cardiovascular: negative for chest pain, dyspnea on exertion, edema, orthopnea, palpitations, paroxysmal nocturnal dyspnea or shortness of  breath Dermatological: negative for rash Respiratory: negative for cough or wheezing Urologic: negative for hematuria Abdominal: negative for nausea, vomiting, diarrhea, bright red blood per rectum, melena, or hematemesis Neurologic: negative for visual changes, syncope, or dizziness All other systems reviewed and are otherwise negative except as noted above.    Blood pressure 132/80, pulse 88, height 5\' 10"  (1.778 m), weight 118 lb (53.524 kg).  General appearance: alert, cooperative, cachectic and no distress Lungs: clear to auscultation bilaterally Heart: regular rate and rhythm    ASSESSMENT AND PLAN:   Chest pain, negative MI, adjusted meds TCM follow up visit today  UTI (urinary tract infection) Klebsiella UTI treated with Cipro- I suspect this was the reason for his recent illness  Protein-calorie malnutrition, severe [facility], he is on chronic steroids for this  Prostate cancer Surgery 2002 Dr Annabell Howells  CAD, CABG X 3 9/09. Cath 2012 and 02/05/13- 2/3 grafts occluded- medical Rx I could no history suspicious for angina. He does have exertional fatigue.  HTN (hypertension) Controlled  Hyperlipemia Recent LDL 68   PLAN  We can see him back in three months. I did order a follow up UA, if there is any problem there he should be referred back to his Urologist.   Corine Shelter KPA-C 06/11/2013 2:36 PM

## 2013-06-11 NOTE — Assessment & Plan Note (Addendum)
[facility], he is on chronic [facility] for this

## 2013-06-11 NOTE — Assessment & Plan Note (Signed)
Klebsiella UTI treated with Cipro- I suspect this was the reason for his recent illness

## 2013-06-11 NOTE — Assessment & Plan Note (Signed)
I could no history suspicious for angina. He does have exertional fatigue.

## 2013-06-11 NOTE — Assessment & Plan Note (Signed)
Controlled.  

## 2013-06-11 NOTE — Assessment & Plan Note (Signed)
Recent LDL 68

## 2013-06-12 LAB — URINALYSIS
Bilirubin Urine: NEGATIVE
Glucose, UA: NEGATIVE mg/dL
Hgb urine dipstick: NEGATIVE
Ketones, ur: NEGATIVE mg/dL
Leukocytes, UA: NEGATIVE
Nitrite: NEGATIVE
Protein, ur: NEGATIVE mg/dL
Specific Gravity, Urine: 1.018 (ref 1.005–1.030)
Urobilinogen, UA: 0.2 mg/dL (ref 0.0–1.0)
pH: 5.5 (ref 5.0–8.0)

## 2013-06-12 NOTE — Progress Notes (Signed)
Pt. Informed of urinalysis.

## 2013-06-19 ENCOUNTER — Other Ambulatory Visit: Payer: Self-pay

## 2013-06-24 ENCOUNTER — Telehealth: Payer: Self-pay | Admitting: *Deleted

## 2013-06-24 NOTE — Telephone Encounter (Signed)
FMLA papers filled out by Tony Curtis. Patient notified and will come by to pick up papers

## 2013-07-05 DIAGNOSIS — C61 Malignant neoplasm of prostate: Secondary | ICD-10-CM

## 2013-07-05 DIAGNOSIS — E43 Unspecified severe protein-calorie malnutrition: Secondary | ICD-10-CM

## 2013-07-05 DIAGNOSIS — N390 Urinary tract infection, site not specified: Secondary | ICD-10-CM

## 2013-07-05 DIAGNOSIS — I10 Essential (primary) hypertension: Secondary | ICD-10-CM

## 2013-07-05 DIAGNOSIS — I2510 Atherosclerotic heart disease of native coronary artery without angina pectoris: Secondary | ICD-10-CM

## 2013-07-05 DIAGNOSIS — R079 Chest pain, unspecified: Secondary | ICD-10-CM

## 2013-07-05 DIAGNOSIS — E785 Hyperlipidemia, unspecified: Secondary | ICD-10-CM

## 2013-07-16 ENCOUNTER — Telehealth: Payer: Self-pay | Admitting: Cardiovascular Disease

## 2013-07-16 NOTE — Telephone Encounter (Signed)
Pt is here in the office now. He stated that he brought in some disability forms last week. He was told by the HR office on his job that the papers were filled out incorrectly. He stated that he is unable to work, and will need new form filled out today, which he stated that the forms are due today. He is waiting to speak with [name]. Please advise patient. thanks

## 2013-07-17 NOTE — Telephone Encounter (Signed)
Tony Curtis asked for [facility] and see if it might be [name] she needs to talk to.

## 2013-07-17 NOTE — Telephone Encounter (Signed)
Returning your call. °

## 2013-07-18 NOTE — Telephone Encounter (Signed)
Forwarded to [employer]

## 2013-07-21 NOTE — Telephone Encounter (Signed)
This was sent to me accidentally. I think this patient is returning your call about the form that she needed completed.

## 2013-07-23 NOTE — Telephone Encounter (Signed)
lmom at home number and cell phone not working.  I need to know what was the last day worked and inform him about the papers

## 2013-07-29 NOTE — Telephone Encounter (Signed)
I spoke with patient.  He has not returned to work since his October hospitalization.  He is actively seeking disability from the [place].  FMLA paperwork was filled out by [place] and faxed in.

## 2013-07-31 ENCOUNTER — Telehealth: Payer: Self-pay | Admitting: *Deleted

## 2013-07-31 NOTE — Telephone Encounter (Signed)
Papers filled out and faxed in to [facility]

## 2013-09-11 ENCOUNTER — Ambulatory Visit (INDEPENDENT_AMBULATORY_CARE_PROVIDER_SITE_OTHER): Payer: Medicare Other | Admitting: Cardiology

## 2013-09-11 ENCOUNTER — Encounter: Payer: Self-pay | Admitting: Cardiology

## 2013-09-11 VITALS — BP 156/88 | HR 84 | Ht 70.0 in | Wt 122.0 lb

## 2013-09-11 DIAGNOSIS — I2510 Atherosclerotic heart disease of native coronary artery without angina pectoris: Secondary | ICD-10-CM

## 2013-09-11 DIAGNOSIS — E43 Unspecified severe protein-calorie malnutrition: Secondary | ICD-10-CM

## 2013-09-11 DIAGNOSIS — E785 Hyperlipidemia, unspecified: Secondary | ICD-10-CM

## 2013-09-11 DIAGNOSIS — I10 Essential (primary) hypertension: Secondary | ICD-10-CM

## 2013-09-11 DIAGNOSIS — B029 Zoster without complications: Secondary | ICD-10-CM | POA: Insufficient documentation

## 2013-09-11 DIAGNOSIS — I739 Peripheral vascular disease, unspecified: Secondary | ICD-10-CM

## 2013-09-11 NOTE — Patient Instructions (Signed)
Your physician recommends that you schedule a follow-up appointment in: 6 months with Dr Pearla Dubonnet Dr Roland Earl office today

## 2013-09-11 NOTE — Progress Notes (Signed)
09/11/2013 Tony Curtis   08/09/1936  [id number]  Primary [place]., MD Primary Cardiologist: Dr [name]  HPI:  78 y/o [place] war vet followed by Dr [name] and the [place] in [place] (Dr Sherril Cong). He had CABG X 3 in 2009. He has some type of chronic fatigue, malnutrition syndrome and is on chronic steroids. He was admitted in June 2014 for chest pain and had a cath which revealed an occluded SVG-RI, and an occluded SVG-PDA. This is unchanged from his previous cath from 2012. His EF at cath was 60% and this is actually improved from a prior echo that showed his EF to 40% in 2009. The plan is for continued medical RX. He was seen in the office in October and was doing well. We feel some of his symptoms when he was admitted in October were from a Klebsiella UTI. He has been working on getting disability, I believe all the paper work is in. He is here for routine check up. He has Rt sided chest pain and noted a line of blisters that have come up in the last 24 hrs.     Current Outpatient Prescriptions  Medication Sig Dispense Refill  . aspirin EC 81 MG tablet Take 81 mg by mouth daily.      . cholecalciferol (VITAMIN D) 1000 UNITS tablet Take 1,000 Units by mouth daily.      . folic acid (FOLVITE) 1 MG tablet Take 1 mg by mouth See admin instructions. Take on every day except Saturdays (don't take with Methotrexate)      . isosorbide mononitrate (IMDUR) 30 MG 24 hr tablet Take 1 tablet (30 mg total) by mouth daily.  30 tablet  6  . methotrexate (RHEUMATREX) 2.5 MG tablet Take 12.5 mg by mouth once a week. Caution:Chemotherapy. Protect from light.  Take 5 tablets (12.5mg ) on Saturdays      . metoprolol (LOPRESSOR) 50 MG tablet Take 1 tablet (50 mg total) by mouth 2 (two) times daily.  60 tablet  6  . mirtazapine (REMERON) 15 MG tablet Take 15 mg by mouth at bedtime.      . nitroGLYCERIN (NITROSTAT) 0.4 MG SL tablet Place 0.4 mg under the tongue every 5 (five) minutes as needed for chest  pain.      Marland Kitchen oxybutynin (DITROPAN) 5 MG tablet Take 5 mg by mouth 2 (two) times daily.       . pantoprazole (PROTONIX) 40 MG tablet Take 40 mg by mouth 2 (two) times daily as needed (heartburn).       [name] Glycol-Propyl Glycol (SYSTANE) 0.4-0.3 % SOLN Place 1 drop into both eyes 4 (four) times daily as needed (dry eyes).      . pravastatin (PRAVACHOL) 40 MG tablet Take 40 mg by mouth at bedtime.      . predniSONE (DELTASONE) 5 MG tablet Take 5 mg by mouth daily with breakfast.      . PRESCRIPTION MEDICATION Apply 1 application topically at bedtime as needed (for back and neck pain). Medicated cream from [place]      . traMADol (ULTRAM) 50 MG tablet Take 100 mg by mouth 3 (three) times daily as needed for pain. Maximum dose= 8 tablets per day       No current facility-administered medications for this visit.    Allergies  Allergen Reactions  . Acetaminophen Rash    History   Social History  . Marital Status: Married    Spouse Name: N/A    Number  of Children: N/A  . Years of Education: N/A   Occupational History  . Not on file.   Social History Main Topics  . Smoking status: Former Smoker    Quit date: 06/14/1979  . Smokeless tobacco: Never Used  . Alcohol Use: Yes     Comment: occasional wine  . Drug Use: No  . Sexual Activity: Not on file   Other Topics Concern  . Not on file   Social History Narrative  . No narrative on file     Review of Systems: General: negative for chills, fever, night sweats or weight changes.  Cardiovascular: negative for chest pain, dyspnea on exertion, edema, orthopnea, palpitations, paroxysmal nocturnal dyspnea or shortness of breath Dermatological: negative for rash Respiratory: negative for cough or wheezing Urologic: negative for hematuria Abdominal: negative for nausea, vomiting, diarrhea, bright red blood per rectum, melena, or hematemesis Neurologic: negative for visual changes, syncope, or dizziness All other systems reviewed and  are otherwise negative except as noted above.    Blood pressure 156/88, pulse 84, height 5\' 10"  (1.778 m), weight 122 lb (55.339 kg).  General appearance: alert, cooperative, cachectic and no distress Lungs: clear to auscultation bilaterally Heart: regular rate and rhythm Skin: Skin color, texture, turgor normal. No rashes or lesions or blisters and red welps accross Rt lat chest   ASSESSMENT AND PLAN:   CAD, CABG X 3 9/09. Cath 2012 and 02/05/13- 2/3 grafts occluded- medical Rx No angina  HTN (hypertension) .  Hyperlipemia .  Protein-calorie malnutrition, severe, he is on chronic steroids Followed at [facility]  PVD - Rt SFA PTA 8/112 .  Shingles outbreak Rt chest, onset 24-48 hrs ago    PLAN  We have contacted his primary care provider and asked him to go there this afternoon for Rx of his shingles. Same cardiac Rx, follow up in 6 months with Dr [name].  Deina Lipsey KPA-C 09/11/2013 2:42 PM

## 2013-09-11 NOTE — Assessment & Plan Note (Signed)
No angina 

## 2013-09-11 NOTE — Assessment & Plan Note (Signed)
Rt chest, onset 24-48 hrs ago

## 2013-09-11 NOTE — Assessment & Plan Note (Signed)
[facility] 

## 2013-09-26 ENCOUNTER — Telehealth (HOSPITAL_COMMUNITY): Payer: Self-pay | Admitting: *Deleted

## 2014-06-09 ENCOUNTER — Telehealth: Payer: Self-pay | Admitting: *Deleted

## 2014-06-09 NOTE — Telephone Encounter (Signed)
Patient walked in requesting Dr [name] to write a letter regarding his 2014 hospitalization.  The [facility] is refusing [facility] that the patient received in 2014 at [facility].   I will defer to Dr [name].

## 2014-06-10 NOTE — Telephone Encounter (Signed)
Lm for patient to call me.  The letter is ready for pick up

## 2014-06-15 NOTE — Telephone Encounter (Signed)
I spoke with patient.  He will come by and pick up the letter.  Letter was left at the front desk.

## 2014-07-22 ENCOUNTER — Encounter (HOSPITAL_COMMUNITY): Payer: Self-pay | Admitting: Cardiovascular Disease

## 2014-07-23 ENCOUNTER — Encounter (HOSPITAL_COMMUNITY): Payer: Self-pay | Admitting: Cardiovascular Disease

## 2014-08-25 ENCOUNTER — Other Ambulatory Visit (HOSPITAL_COMMUNITY): Payer: Self-pay | Admitting: Surgical Oncology

## 2014-08-25 DIAGNOSIS — I70235 Atherosclerosis of native arteries of right leg with ulceration of other part of foot: Secondary | ICD-10-CM

## 2014-08-25 DIAGNOSIS — I7090 Unspecified atherosclerosis: Secondary | ICD-10-CM

## 2014-08-28 ENCOUNTER — Encounter (HOSPITAL_COMMUNITY): Payer: Self-pay

## 2014-08-28 ENCOUNTER — Ambulatory Visit (HOSPITAL_COMMUNITY)
Admission: RE | Admit: 2014-08-28 | Discharge: 2014-08-28 | Disposition: A | Discharge status: Home / Self Care | Payer: Medicare Other | Source: Ambulatory Visit | Attending: Surgical Oncology | Admitting: Surgical Oncology

## 2014-08-28 DIAGNOSIS — M79662 Pain in left lower leg: Secondary | ICD-10-CM | POA: Insufficient documentation

## 2014-08-28 DIAGNOSIS — M79661 Pain in right lower leg: Secondary | ICD-10-CM | POA: Insufficient documentation

## 2014-08-28 DIAGNOSIS — I70235 Atherosclerosis of native arteries of right leg with ulceration of other part of foot: Secondary | ICD-10-CM

## 2014-08-28 DIAGNOSIS — R202 Paresthesia of skin: Secondary | ICD-10-CM | POA: Insufficient documentation

## 2014-08-28 DIAGNOSIS — I7090 Unspecified atherosclerosis: Secondary | ICD-10-CM

## 2014-08-28 DIAGNOSIS — R200 Anesthesia of skin: Secondary | ICD-10-CM | POA: Insufficient documentation

## 2014-08-28 DIAGNOSIS — M79669 Pain in unspecified lower leg: Secondary | ICD-10-CM | POA: Diagnosis present

## 2014-08-28 LAB — POCT I-STAT CREATININE: Creatinine, Ser: 1.1 mg/dL (ref 0.50–1.35)

## 2014-08-28 MED ORDER — IOHEXOL 350 MG/ML SOLN
100.0000 mL | Freq: Once | INTRAVENOUS | Status: AC | PRN
  Administered 2014-08-28: 100 mL via INTRAVENOUS

## 2014-11-18 ENCOUNTER — Telehealth: Payer: Self-pay

## 2014-11-18 NOTE — Telephone Encounter (Signed)
Patient walked in office to schedule appointment to see Dr.Berry.Stated he has been having chest pain, left chest with the least activity.Stated he has been having pain off and on since his heart surgery 2009.Stated he is also having numbness in both lower legs.Advised Dr.Berry's schedule is full.Stated he would like appointment.Appointment scheduled with Cecilie Kicks NP 11/19/14 at 10:00 am at our [facility] office.

## 2014-11-19 ENCOUNTER — Ambulatory Visit (INDEPENDENT_AMBULATORY_CARE_PROVIDER_SITE_OTHER): Payer: Medicare Other | Admitting: Cardiology

## 2014-11-19 ENCOUNTER — Encounter: Payer: Self-pay | Admitting: Cardiology

## 2014-11-19 VITALS — BP 128/70 | HR 86 | Ht 70.0 in | Wt 129.8 lb

## 2014-11-19 DIAGNOSIS — I739 Peripheral vascular disease, unspecified: Secondary | ICD-10-CM | POA: Diagnosis not present

## 2014-11-19 DIAGNOSIS — I10 Essential (primary) hypertension: Secondary | ICD-10-CM | POA: Diagnosis not present

## 2014-11-19 MED ORDER — ISOSORBIDE MONONITRATE ER 30 MG PO TB24: 30.0000 mg | ORAL_TABLET | Freq: Every day | ORAL | Status: DC

## 2014-11-19 MED ORDER — METOPROLOL SUCCINATE ER 25 MG PO TB24: 25.0000 mg | ORAL_TABLET | Freq: Every day | ORAL | Status: DC

## 2014-11-19 NOTE — Progress Notes (Signed)
Cardiology Office Note   Date:  11/19/2014   ID:  Tony Curtis, DOB August 10, 1936, MRN [id number]  PCP:  No PCP Per Patient  Cardiologist:  Dr. Gwenlyn Curtis    Chief Complaint  Patient presents with  . Chest Pain    has chest pain and leg pain      History of Present Illness: Tony Curtis is a 79 y.o. male who presents for chest pain   He is followed by Dr Tony Curtis and the [place] in [place] (Dr Tony Curtis). He had CABG X 3 in 2009. He has some type of chronic fatigue, malnutrition syndrome and is on chronic steroids. He was admitted in June 2014 for chest pain and had a cath which revealed an occluded SVG-RI, and an occluded SVG-PDA. This is unchanged from his previous cath from 2012. His EF at cath was 60% and this is actually improved from a prior echo that showed his EF to 40% in 2009. The plan has been for continued medical RX.   He came by the office yesterday with complaints of chest pain.  Also bil. Claudication.  He is off most of his meds, including Imdur and BB.  His chest pain comes and goes and with exertion it improves.  With his claudication in both calves when he starts walking and eventually improves to more he walks. Lower ext dopplers were done in 2013 with ABIs of 1.2though study describer as abnormal.  He did not come back for follow up.  He also is asking for a letter to be written to describe his CAD.      Past Medical History  Diagnosis Date  . HTN (hypertension)   . Hyperlipemia   . CAD (coronary artery disease)     CABG 9/09. Cath 2012 and 02/05/13  . Prostate cancer   . PVD (peripheral vascular disease)     LE dopp (10/19/11) patent R SFA stent, R ant tib occluded with reconstitution within the dorsalis pedis, L SFA 70-99%, L ant tib occluded with reconstitution of flow in the dorsalis pedis, bil ABI 1.2; carotid dopp (04-27-08)-normal  . H/O echocardiogram 07/08/2008    EF 40% by 2D '09. EF 60% cath 6/14  . Myocardial infarction   . GERD (gastroesophageal reflux  disease)   . Abnormal EKG, resolved with slower HR 06/05/2013  . UTI (urinary tract infection) 06/05/2013    Past Surgical History  Procedure Laterality Date  . History of prostatectomy.    . Right knee surgery for dislocation.    . Cabg in 2009.  05/06/2008    x3, LIMA to LAD, SVG to diad, SVG to ramus inermediate  . Cataract surgery.    . Cardiac catheterization  07/04/2011    patent LIMA to diag branch, occluded ben grafts to diag and ramus branch; EF >60%   . Cardiac catheterization  03/20/2008    occluded prox LAD with great collaterals from the ramus and the circ and RCA to LAD; EF 45-50%, med tx  . Abdominal angiogram  10/13/08    LSFA PTA  . Abdominal angiogram  07/28/2008    RSFA  . Cardiac catheterization  02/05/13    SVGs occluded, no change  . Left heart catheterization with coronary/graft angiogram  07/04/2011    Procedure: LEFT HEART CATHETERIZATION WITH Beatrix Fetters;  Surgeon: Lorretta Harp, MD;  Location: [facility] CATH LAB;  Service: Cardiovascular;;  . Left heart catheterization with coronary angiogram N/A 02/05/2013    Procedure: LEFT HEART CATHETERIZATION WITH  CORONARY ANGIOGRAM;  Surgeon: Lorretta Harp, MD;  Location: [facility] CATH LAB;  Service: Cardiovascular;  Laterality: N/A;  . Left heart catheterization with coronary/graft angiogram  02/05/2013    Procedure: LEFT HEART CATHETERIZATION WITH Beatrix Fetters;  Surgeon: Lorretta Harp, MD;  Location: [facility] CATH LAB;  Service: Cardiovascular;;     Current Outpatient Prescriptions  Medication Sig Dispense Refill  . aspirin EC 81 MG tablet Take 81 mg by mouth daily.    . cholecalciferol (VITAMIN D) 1000 UNITS tablet Take 1,000 Units by mouth daily.    . nitroGLYCERIN (NITROSTAT) 0.4 MG SL tablet Place 0.4 mg under the tongue every 5 (five) minutes as needed for chest pain.    [name] Glycol-Propyl Glycol (SYSTANE) 0.4-0.3 % SOLN Place 1 drop into both eyes 4 (four) times daily as needed (dry  eyes).    Marland Kitchen PRESCRIPTION MEDICATION Apply 1 application topically at bedtime as needed (for back and neck pain). Medicated cream from [place]     No current facility-administered medications for this visit.    Allergies:   Acetaminophen    Social History:  The patient  reports that he quit smoking about 35 years ago. He has never used smokeless tobacco. He reports that he drinks alcohol. He reports that he does not use illicit drugs.   Family History:  The patient's family history includes Cancer (age of onset: 69) in his mother; Coronary artery disease in his sister; Coronary artery disease (age of onset: 73) in his father. He was adopted.    ROS:  General:no colds or fevers, no weight changes- thin but stable.  Skin:no rashes or ulcers HEENT:no blurred vision, no congestion CV:see HPI PUL:see HPI GI:no diarrhea constipation or melena, no indigestion GU:no hematuria, no dysuria MS:no joint pain, no claudication Neuro:no syncope, no lightheadedness Endo:no diabetes, no thyroid disease   Wt Readings from Last 3 Encounters:  11/19/14 129 lb 12.8 oz (58.877 kg)  09/11/13 122 lb (55.339 kg)  06/11/13 118 lb (53.524 kg)     PHYSICAL EXAM: VS:  BP 128/70 mmHg  Pulse 86  Ht 5\' 10"  (1.778 m)  Wt 129 lb 12.8 oz (58.877 kg)  BMI 18.62 kg/m2 , BMI Body mass index is 18.62 kg/(m^2). General:Pleasant affect, NAD Skin:Warm and dry, brisk capillary refill HEENT:normocephalic, sclera clear, mucus membranes moist Neck:supple, no JVD, no bruits  Heart:S1S2 RRR without murmur, gallup, rub or click Lungs:clear without rales, rhonchi, or wheezes XTG:GYIR, non tender, + BS, do not palpate liver spleen or masses Ext:no lower ext edema, ? pedal pulses, 2+ radial pulses Neuro:alert and oriented, MAE, follows commands, + facial symmetry    EKG:  EKG is ordered today. The ekg ordered today demonstrates SR rate of 86 no acute changes.   Recent Labs: 08/28/2014: Creatinine 1.10    Lipid  Panel    Component Value Date/Time   CHOL 137 06/05/2013 0203   TRIG 64 06/05/2013 0203   HDL 56 06/05/2013 0203   CHOLHDL 2.4 06/05/2013 0203   VLDL 13 06/05/2013 0203   LDLCALC 68 06/05/2013 0203       Other studies Reviewed: Additional studies/ records that were reviewed today include:previous notes, labs, CV studies..   ASSESSMENT AND PLAN:  1.  Chest pain.will plan lexiscan myoview for his chest pain and add imdur and toprol XL back to meds. Will check BMP, TSH and CBC    2. CAD with hx CABG, last cath with graft dysfunction. Cath with occl SVG-RI, and an occluded SVG-PDA.  This is unchanged from his previous cath from 2012. His EF at cath was 60% and this is actually improved from a prior echo that showed his EF to 40% in 2009.  This could be related to agent orange.  3.  HTN stable  4. HLD, followed by [place].   5. PVD-Rt SFA PTA 03/2011 now with claudication will recheck arterial dopplers. Follow up with Dr. Gwenlyn Curtis though if abnormal wewill address before then.  Current medicines are reviewed with the patient today.  The patient Has no concerns regarding medicines.  The following changes have been made:  See above Labs/ tests ordered today include:see above  Disposition:   FU:  see above  Lennie Muckle, NP  11/19/2014 10:54 AM    [facility] [place], [place], [address] [place], [place] Phone: [phone]; Fax: [phone]

## 2014-11-19 NOTE — Patient Instructions (Signed)
Schedule Lexiscan Myoview   Follow instructions given   Schedule Lower Ext Dopplers   Start Imdur 30 mg daily  Start Toprol XL 25 mg daily   Your physician recommends that you schedule a follow-up appointment first available with Dr.Berry

## 2014-11-25 ENCOUNTER — Telehealth: Payer: Self-pay | Admitting: Cardiovascular Disease

## 2014-11-25 NOTE — Telephone Encounter (Signed)
Closed encounter °

## 2014-12-01 ENCOUNTER — Telehealth (HOSPITAL_COMMUNITY): Payer: Self-pay

## 2014-12-01 NOTE — Telephone Encounter (Signed)
Encounter complete. 

## 2014-12-03 ENCOUNTER — Ambulatory Visit (HOSPITAL_COMMUNITY)
Admission: RE | Admit: 2014-12-03 | Discharge: 2014-12-03 | Disposition: A | Discharge status: Home / Self Care | Payer: Medicare Other | Source: Ambulatory Visit | Attending: Cardiology | Admitting: Cardiology

## 2014-12-03 DIAGNOSIS — I10 Essential (primary) hypertension: Secondary | ICD-10-CM

## 2014-12-03 DIAGNOSIS — I739 Peripheral vascular disease, unspecified: Secondary | ICD-10-CM

## 2014-12-03 DIAGNOSIS — I2510 Atherosclerotic heart disease of native coronary artery without angina pectoris: Secondary | ICD-10-CM

## 2014-12-03 MED ORDER — REGADENOSON 0.4 MG/5ML IV SOLN
0.4000 mg | Freq: Once | INTRAVENOUS | Status: AC
  Administered 2014-12-03: 0.4 mg via INTRAVENOUS

## 2014-12-03 MED ORDER — TECHNETIUM TC 99M SESTAMIBI GENERIC - CARDIOLITE
31.2000 | Freq: Once | INTRAVENOUS | Status: AC | PRN
  Administered 2014-12-03: 31.2 via INTRAVENOUS

## 2014-12-03 MED ORDER — TECHNETIUM TC 99M SESTAMIBI GENERIC - CARDIOLITE
10.1000 | Freq: Once | INTRAVENOUS | Status: AC | PRN
  Administered 2014-12-03: 10.1 via INTRAVENOUS

## 2014-12-03 MED ORDER — AMINOPHYLLINE 25 MG/ML IV SOLN
75.0000 mg | Freq: Once | INTRAVENOUS | Status: AC
  Administered 2014-12-03: 75 mg via INTRAVENOUS

## 2014-12-03 NOTE — Progress Notes (Signed)
Lower extremity arterial duplex completed. °Brianna L Mazza,RVT °

## 2014-12-03 NOTE — Procedures (Addendum)
[place] [address] [place] [address] [phone]  Cardiology Nuclear Med Study  Tony Curtis is a 80 y.o. male     MRN : [id number]     DOB: Apr 14, 1936  Procedure Date: 12/03/2014  Nuclear Med Background Indication for Stress Test:  Follow up CAD History:  CAD;CABG X3-2009;MI;Last NUC MPI on 02/05/2013-scar;STENT/PTCA; Cardiac Risk Factors: Family History - CAD, History of Smoking, Hypertension, Lipids and PVD  Symptoms:  Chest Pain, Fatigue, Light-Headedness, Palpitations, SOB and Bilateral leg pain   Nuclear Pre-Procedure Caffeine/Decaff Intake:  8:00pm NPO After: 4:00am   IV Site: R Forearm  IV 0.9% NS with Angio Cath:  22g  Chest Size (in):  36" IV Started by: Larene Beach, RN  Height: 5\' 10"  (1.778 m)  Cup Size: n/a  BMI:  Body mass index is 18.51 kg/(m^2). Weight:  129 lb (58.514 kg)   Tech Comments:  n/a    Nuclear Med Study 1 or 2 day study: 1 day  Stress Test Type:  [place]  Order Authorizing Provider:  [name], MD   Resting Radionuclide: Technetium 53m Sestamibi  Resting Radionuclide Dose: 10.1 mCi   Stress Radionuclide:  Technetium 53m Sestamibi  Stress Radionuclide Dose: 31.2 mCi           Stress Protocol Rest HR: 55 Stress HR: 84  Rest BP: 160/98 Stress BP: 176/84  Exercise Time (min): n/a METS: n/a          Dose of Adenosine (mg):  n/a Dose of Lexiscan: 0.4 mg  Dose of Atropine (mg): n/a Dose of Dobutamine: n/a mcg/kg/min (at max HR)  Stress Test Technologist: Leane Para, CCT Nuclear Technologist:Elizabeth Young,CNMT   Rest Procedure:  Myocardial perfusion imaging was performed at rest 45 minutes following the intravenous administration of Technetium 24m Sestamibi. Stress Procedure:  The patient received IV Lexiscan 0.4 mg over 15-seconds.  Technetium 89m Sestamibi injected  IV at 30-seconds. Patient experienced SOB, Stomach Discomfort and weakness and 75 mg  Aminophylline IV was administered. There were no significant changes with Lexiscan.  Quantitative spect images were obtained after a 45 minute delay.  Transient Ischemic Dilatation (Normal <1.22):  1.01  QGS EDV:  80 ml QGS ESV:  41 ml LV Ejection Fraction: 49%     Rest ECG: NSR - Normal EKG  Stress ECG: No significant change from baseline ECG  QPS Raw Data Images:  Normal; no motion artifact; normal heart/lung ratio. Stress Images:  Small in size, mild in intesity apical anteroseptal defect Rest Images:  Comparison with the stress images reveals no significant change. Subtraction (SDS):  No evidence of ischemia.  Impression Exercise Capacity:  Lexiscan with no exercise. BP Response:  Normal blood pressure response. Clinical Symptoms:  Mild shortness of breath ECG Impression:  No significant ST segment change suggestive of ischemia. Comparison with Prior Nuclear Study: No images to compare  Overall Impression:  Low risk stress nuclear study demonstrating a very small mild apical-distal anteroseptal defect (extent 4%) without ischemia suggestive of a small region on nontransmural scar versus attenuation.  LV Wall Motion:  Low normal LV Function, 49; NL Wall Motion   KELLY,[name], MD  12/03/2014 1:04 PM

## 2014-12-04 NOTE — Progress Notes (Signed)
Please return office visit with me to discuss his Myoview, chest pain. He also has claudication so see if this can be scheduled after his lower extremity arterial Doppler studies.

## 2014-12-11 ENCOUNTER — Encounter: Payer: Self-pay | Admitting: Cardiovascular Disease

## 2014-12-11 ENCOUNTER — Ambulatory Visit (INDEPENDENT_AMBULATORY_CARE_PROVIDER_SITE_OTHER): Payer: Medicare Other | Admitting: Cardiovascular Disease

## 2014-12-11 VITALS — BP 146/80 | HR 60 | Ht 70.0 in | Wt 126.0 lb

## 2014-12-11 DIAGNOSIS — Z79899 Other long term (current) drug therapy: Secondary | ICD-10-CM

## 2014-12-11 DIAGNOSIS — R079 Chest pain, unspecified: Secondary | ICD-10-CM | POA: Diagnosis not present

## 2014-12-11 DIAGNOSIS — E785 Hyperlipidemia, unspecified: Secondary | ICD-10-CM

## 2014-12-11 DIAGNOSIS — Z87891 Personal history of nicotine dependence: Secondary | ICD-10-CM

## 2014-12-11 DIAGNOSIS — I739 Peripheral vascular disease, unspecified: Secondary | ICD-10-CM | POA: Diagnosis not present

## 2014-12-11 DIAGNOSIS — I10 Essential (primary) hypertension: Secondary | ICD-10-CM

## 2014-12-11 DIAGNOSIS — R5383 Other fatigue: Secondary | ICD-10-CM

## 2014-12-11 DIAGNOSIS — D689 Coagulation defect, unspecified: Secondary | ICD-10-CM

## 2014-12-11 MED ORDER — NITROGLYCERIN 0.4 MG SL SUBL: 0.4000 mg | SUBLINGUAL_TABLET | SUBLINGUAL | Status: DC | PRN

## 2014-12-11 MED ORDER — METOPROLOL SUCCINATE ER 25 MG PO TB24: 25.0000 mg | ORAL_TABLET | Freq: Every day | ORAL | Status: DC

## 2014-12-11 MED ORDER — ISOSORBIDE MONONITRATE ER 30 MG PO TB24: 30.0000 mg | ORAL_TABLET | Freq: Every day | ORAL | Status: DC

## 2014-12-11 NOTE — Assessment & Plan Note (Signed)
History of peripheral vascular disease status post right SFA stenting in 2012 with recurrent right calf claudication and recent Dopplers showing a right ABI of 0.44 with an occluded right SFA and popliteal artery. This was done on 4/20 once a 16. Patient will need angiography and potential re-intervention.

## 2014-12-11 NOTE — Assessment & Plan Note (Signed)
History of hypertension blood pressure measured at 146/80. He is on metoprolol. Continue current meds at current dosing

## 2014-12-11 NOTE — Progress Notes (Signed)
12/11/2014 Tony Curtis   03-14-1936  [id number]  Primary Physician No PCP Per Patient Primary Cardiologist: Lorretta Harp MD [name]   HPI:  The patient is a 78y/o AAM, followed at [facility] by Dr. [name]. He has known CAD, s/p CABG x 3 in 2009. He received a LIMA to LAD, a vein graft to the diagonal branch and a vein graft to the ramus branch. A repeat cardiac cath in 2012 revealed a patent LIMA graft, but both vein grafts were found to be occluded. He also has peripheral vascular disease, s/p PTA and stenting of his right SFA in 2012, as well as hypertension and hyperlipidemia. He presented to [facility] on [facility]23 with a complaint of intermittent substernal chest pain, over the last several days, that was worse yesterday evening. The pain is similar to his angina before is CABG. The pain has become more frequent and wakes him from his sleep. It radiates to the left jaw back and bilateral scapula. The pain is non-exertional. Last PM, the pain was 10/10. It was responsive to SL NTG. He denies any other associated symptoms. He ruled out for myocardial infarction with negative enzymes. He underwent Myoview stress testing and developed chest pain and ST segment changes during Lexiscan infusion. Because of this I performed a catheterization on him 02/05/13 revealing essentially unchanged anatomy. He does complain of right calf claudication and recurrent chest pain. A recent Myoview was low-risk rule out semi-Dopplers performed 12/03/14 revealed a right ABI 0.44 with an occluded distal right ft SFA and popliteal artery.  Current Outpatient Prescriptions  Medication Sig Dispense Refill  . aspirin EC 81 MG tablet Take 81 mg by mouth daily.    . cholecalciferol (VITAMIN D) 1000 UNITS tablet Take 1,000 Units by mouth daily.    . isosorbide mononitrate (IMDUR) 30 MG 24 hr tablet Take 1 tablet (30 mg total) by mouth daily. 30 tablet 6  . metoprolol succinate (TOPROL XL) 25 MG 24 hr tablet Take 1 tablet  (25 mg total) by mouth daily. 30 tablet 6  . nitroGLYCERIN (NITROSTAT) 0.4 MG SL tablet Place 0.4 mg under the tongue every 5 (five) minutes as needed for chest pain.    [name] Glycol-Propyl Glycol (SYSTANE) 0.4-0.3 % SOLN Place 1 drop into both eyes 4 (four) times daily as needed (dry eyes).    Marland Kitchen PRESCRIPTION MEDICATION Apply 1 application topically at bedtime as needed (for back and neck pain). Medicated cream from [place]     No current facility-administered medications for this visit.    No Known Allergies  History   Social History  . Marital Status: Married    Spouse Name: N/A  . Number of Children: N/A  . Years of Education: N/A   Occupational History  . Not on file.   Social History Main Topics  . Smoking status: Former Smoker    Quit date: 06/14/1979  . Smokeless tobacco: Never Used  . Alcohol Use: Yes     Comment: occasional wine  . Drug Use: No  . Sexual Activity: Not on file   Other Topics Concern  . Not on file   Social History Narrative     Review of Systems: General: negative for chills, fever, night sweats or weight changes.  Cardiovascular: negative for chest pain, dyspnea on exertion, edema, orthopnea, palpitations, paroxysmal nocturnal dyspnea or shortness of breath Dermatological: negative for rash Respiratory: negative for cough or wheezing Urologic: negative for hematuria Abdominal: negative for nausea, vomiting, diarrhea,  bright red blood per rectum, melena, or hematemesis Neurologic: negative for visual changes, syncope, or dizziness All other systems reviewed and are otherwise negative except as noted above.    Blood pressure 146/80, pulse 60, height 5\' 10"  (1.778 m), weight 126 lb (57.153 kg).  General appearance: alert and no distress Neck: no adenopathy, no carotid bruit, no JVD, supple, symmetrical, trachea midline and thyroid not enlarged, symmetric, no tenderness/mass/nodules Lungs: clear to auscultation bilaterally Heart: regular rate  and rhythm, S1, S2 normal, no murmur, click, rub or gallop Extremities: extremities normal, atraumatic, no cyanosis or edema  EKG not performed today  ASSESSMENT AND PLAN:   PVD - Rt SFA PTA 8/112 History of peripheral vascular disease status post right SFA stenting in 2012 with recurrent right calf claudication and recent Dopplers showing a right ABI of 0.44 with an occluded right SFA and popliteal artery. This was done on 4/20 once a 16. Patient will need angiography and potential re-intervention.   CAD, CABG X 3 9/09. Cath 2012 and 02/05/13- 2/3 grafts occluded- medical Rx History of CAD status post coronary artery bypass grafting in 2009 with a LIMA to his LAD, vein graft to diagonal branch and ramus branch branch. He had repeat catheterization 2002 revealing a patent LIMA occluded veins. He underwent repeat catheterization by myself 02/05/13 revealing unchanged anatomy. He briefly had a Myoview stress test performed one week ago that showed a small apical defect that was low risk. He continues to have chest pain. I plan on performing diagnostic coronary arteriography at that same time as peripheral angiography on 12/31/14.   Hyperlipemia History of hyperlipidemia not on statin therapy. We will recheck a lipid and liver profile   HTN (hypertension) History of hypertension blood pressure measured at 146/80. He is on metoprolol. Continue current meds at current dosing       Lorretta Harp MD [facility], [facility] 12/11/2014 10:52 AM

## 2014-12-11 NOTE — Assessment & Plan Note (Signed)
History of CAD status post coronary artery bypass grafting in 2009 with a LIMA to his LAD, vein graft to diagonal branch and ramus branch branch. He had repeat catheterization 2002 revealing a patent LIMA occluded veins. He underwent repeat catheterization by myself 02/05/13 revealing unchanged anatomy. He briefly had a Myoview stress test performed one week ago that showed a small apical defect that was low risk. He continues to have chest pain. I plan on performing diagnostic coronary arteriography at that same time as peripheral angiography on 12/31/14.

## 2014-12-11 NOTE — Assessment & Plan Note (Signed)
History of hyperlipidemia not on statin therapy.  We will recheck a lipid and liver profile. 

## 2014-12-11 NOTE — Patient Instructions (Addendum)
Dr. [name] has ordered a cardiac and peripheral angiogram to be done at [facility].  This procedure is going to look at the bloodflow in your lower extremities.  If Dr. [name] is able to open up the arteries, you will have to spend one night in the hospital.  If he is not able to open the arteries, you will be able to go home that same day.    After the procedure, you will not be allowed to drive for 3 days or push, pull, or lift anything greater than 10 lbs for one week.    You will be required to have the following tests prior to the procedure:  1. Blood work (fasting)-the blood work can be done no more than 7 days prior to the procedure.  It can be done at any [facility] lab.  There is one downstairs on the first floor of this building and one in the [place] ([address])  2. Chest Xray-the chest xray order has already been placed at the [facility].     *REPS [place] Left groin

## 2014-12-14 ENCOUNTER — Encounter: Payer: Self-pay | Admitting: Cardiovascular Disease

## 2014-12-21 ENCOUNTER — Telehealth: Payer: Self-pay | Admitting: Cardiovascular Disease

## 2014-12-21 NOTE — Telephone Encounter (Signed)
Received [employer] [place] forms from [employer] for Dr [name] to review and sign.  Given to Marylu Lund, RN for Dr [name].

## 2014-12-29 ENCOUNTER — Ambulatory Visit
Admission: RE | Admit: 2014-12-29 | Discharge: 2014-12-29 | Disposition: A | Discharge status: Home / Self Care | Payer: Medicare Other | Source: Ambulatory Visit | Attending: Cardiovascular Disease | Admitting: Cardiovascular Disease

## 2014-12-29 ENCOUNTER — Telehealth: Payer: Self-pay | Admitting: Cardiovascular Disease

## 2014-12-29 DIAGNOSIS — Z87891 Personal history of nicotine dependence: Secondary | ICD-10-CM

## 2014-12-29 LAB — CBC
HCT: 43.6 % (ref 39.0–52.0)
Hemoglobin: 15 g/dL (ref 13.0–17.0)
MCH: 30.5 pg (ref 26.0–34.0)
MCHC: 34.4 g/dL (ref 30.0–36.0)
MCV: 88.6 fL (ref 78.0–100.0)
MPV: 9.2 fL (ref 8.6–12.4)
Platelets: 205 10*3/uL (ref 150–400)
RBC: 4.92 MIL/uL (ref 4.22–5.81)
RDW: 14.2 % (ref 11.5–15.5)
WBC: 5.8 10*3/uL (ref 4.0–10.5)

## 2014-12-29 LAB — BASIC METABOLIC PANEL
BUN: 15 mg/dL (ref 6–23)
CALCIUM: 9 mg/dL (ref 8.4–10.5)
CO2: 25 mEq/L (ref 19–32)
Chloride: 104 mEq/L (ref 96–112)
Creat: 1.15 mg/dL (ref 0.50–1.35)
GLUCOSE: 90 mg/dL (ref 70–99)
POTASSIUM: 4.6 meq/L (ref 3.5–5.3)
Sodium: 143 mEq/L (ref 135–145)

## 2014-12-29 LAB — PROTIME-INR
INR: 1.05 (ref <1.50)
Prothrombin Time: 13.7 seconds (ref 11.6–15.2)

## 2014-12-29 LAB — LIPID PANEL
Cholesterol: 188 mg/dL (ref 0–200)
HDL: 52 mg/dL (ref >=40)
LDL Cholesterol: 109 mg/dL — ABNORMAL HIGH (ref 0–99)
TRIGLYCERIDES: 135 mg/dL (ref <150)
Total CHOL/HDL Ratio: 3.6 Ratio
VLDL: 27 mg/dL (ref 0–40)

## 2014-12-29 LAB — HEPATIC FUNCTION PANEL
ALBUMIN: 3.7 g/dL (ref 3.5–5.2)
ALT: 15 U/L (ref 0–53)
AST: 20 U/L (ref 0–37)
Alkaline Phosphatase: 129 U/L — ABNORMAL HIGH (ref 39–117)
Bilirubin, Direct: 0.1 mg/dL (ref 0.0–0.3)
Indirect Bilirubin: 0.3 mg/dL (ref 0.2–1.2)
Total Bilirubin: 0.4 mg/dL (ref 0.2–1.2)
Total Protein: 7.1 g/dL (ref 6.0–8.3)

## 2014-12-29 LAB — TSH: TSH: 3.955 u[IU]/mL (ref 0.350–4.500)

## 2014-12-29 LAB — APTT: aPTT: 33 seconds (ref 24–37)

## 2014-12-29 NOTE — Telephone Encounter (Signed)
Fabio Bering called in stating that the pt is currently in the office to have a chest x-ray but she is being told by the patient that a Ct for his legs was supposed to be ordered as well. Please advise  Thanks

## 2014-12-29 NOTE — Telephone Encounter (Signed)
Received completed and signed [employer] Disability form back from [employer].  Notified patient forms were ready for pick up.  Patient acknowleged and advised he will visit office to pick up forms.  lp

## 2014-12-29 NOTE — Telephone Encounter (Signed)
Received a call from [place] with [facility] stating patient said he was to have legs xrayed in addition to cxr.Advised patient scheduled to have just a cxr.Advised he is scheduled for a hospital procedure 01/04/15.

## 2014-12-31 ENCOUNTER — Encounter: Payer: Self-pay | Admitting: *Deleted

## 2014-12-31 DIAGNOSIS — E785 Hyperlipidemia, unspecified: Secondary | ICD-10-CM

## 2015-01-04 ENCOUNTER — Ambulatory Visit (HOSPITAL_COMMUNITY)
Admission: RE | Admit: 2015-01-04 | Discharge: 2015-01-05 | Disposition: A | Discharge status: Home / Self Care | Payer: Medicare Other | Source: Ambulatory Visit | Attending: Cardiovascular Disease | Admitting: Cardiovascular Disease

## 2015-01-04 ENCOUNTER — Encounter (HOSPITAL_COMMUNITY): Payer: Self-pay | Admitting: Cardiovascular Disease

## 2015-01-04 ENCOUNTER — Encounter (HOSPITAL_COMMUNITY)
Admission: RE | Disposition: A | Discharge status: Home / Self Care | Payer: Medicare Other | Source: Ambulatory Visit | Attending: Cardiovascular Disease

## 2015-01-04 DIAGNOSIS — Z7982 Long term (current) use of aspirin: Secondary | ICD-10-CM | POA: Insufficient documentation

## 2015-01-04 DIAGNOSIS — Z79899 Other long term (current) drug therapy: Secondary | ICD-10-CM | POA: Diagnosis not present

## 2015-01-04 DIAGNOSIS — I70211 Atherosclerosis of native arteries of extremities with intermittent claudication, right leg: Secondary | ICD-10-CM | POA: Diagnosis not present

## 2015-01-04 DIAGNOSIS — I209 Angina pectoris, unspecified: Secondary | ICD-10-CM | POA: Diagnosis not present

## 2015-01-04 DIAGNOSIS — E785 Hyperlipidemia, unspecified: Secondary | ICD-10-CM

## 2015-01-04 DIAGNOSIS — I25810 Atherosclerosis of coronary artery bypass graft(s) without angina pectoris: Secondary | ICD-10-CM | POA: Diagnosis not present

## 2015-01-04 DIAGNOSIS — I2582 Chronic total occlusion of coronary artery: Secondary | ICD-10-CM | POA: Diagnosis not present

## 2015-01-04 DIAGNOSIS — Z87891 Personal history of nicotine dependence: Secondary | ICD-10-CM

## 2015-01-04 DIAGNOSIS — I7092 Chronic total occlusion of artery of the extremities: Secondary | ICD-10-CM | POA: Insufficient documentation

## 2015-01-04 DIAGNOSIS — Z951 Presence of aortocoronary bypass graft: Secondary | ICD-10-CM | POA: Insufficient documentation

## 2015-01-04 DIAGNOSIS — D689 Coagulation defect, unspecified: Secondary | ICD-10-CM

## 2015-01-04 DIAGNOSIS — I25700 Atherosclerosis of coronary artery bypass graft(s), unspecified, with unstable angina pectoris: Secondary | ICD-10-CM | POA: Insufficient documentation

## 2015-01-04 DIAGNOSIS — I739 Peripheral vascular disease, unspecified: Secondary | ICD-10-CM | POA: Diagnosis not present

## 2015-01-04 DIAGNOSIS — I70213 Atherosclerosis of native arteries of extremities with intermittent claudication, bilateral legs: Secondary | ICD-10-CM | POA: Diagnosis not present

## 2015-01-04 DIAGNOSIS — I10 Essential (primary) hypertension: Secondary | ICD-10-CM | POA: Diagnosis not present

## 2015-01-04 DIAGNOSIS — I25710 Atherosclerosis of autologous vein coronary artery bypass graft(s) with unstable angina pectoris: Secondary | ICD-10-CM | POA: Diagnosis not present

## 2015-01-04 DIAGNOSIS — R079 Chest pain, unspecified: Secondary | ICD-10-CM

## 2015-01-04 DIAGNOSIS — R5383 Other fatigue: Secondary | ICD-10-CM

## 2015-01-04 HISTORY — PX: PERIPHERAL VASCULAR CATHETERIZATION: SHX172C

## 2015-01-04 HISTORY — PX: CARDIAC CATHETERIZATION: SHX172

## 2015-01-04 HISTORY — DX: Other chronic pain: G89.29

## 2015-01-04 HISTORY — DX: Headache: R51

## 2015-01-04 HISTORY — PX: ANGIOPLASTY / STENTING FEMORAL: SUR30

## 2015-01-04 HISTORY — DX: Headache, unspecified: R51.9

## 2015-01-04 HISTORY — DX: Unspecified osteoarthritis, unspecified site: M19.90

## 2015-01-04 HISTORY — DX: Dorsalgia, unspecified: M54.9

## 2015-01-04 LAB — POCT ACTIVATED CLOTTING TIME
Activated Clotting Time: 270 seconds
Activated Clotting Time: 147 seconds
Activated Clotting Time: 165 seconds

## 2015-01-04 SURGERY — LOWER EXTREMITY ANGIOGRAPHY

## 2015-01-04 MED ORDER — ASPIRIN EC 81 MG PO TBEC: 81.0000 mg | DELAYED_RELEASE_TABLET | Freq: Every day | ORAL | Status: DC

## 2015-01-04 MED ORDER — HEPARIN (PORCINE) IN NACL 2-0.9 UNIT/ML-% IJ SOLN
INTRAMUSCULAR | Status: AC
  Filled 2015-01-04: qty 1500

## 2015-01-04 MED ORDER — ONDANSETRON HCL 4 MG/2ML IJ SOLN: 4.0000 mg | Freq: Four times a day (QID) | INTRAMUSCULAR | Status: DC | PRN

## 2015-01-04 MED ORDER — NITROGLYCERIN 0.4 MG SL SUBL: 0.4000 mg | SUBLINGUAL_TABLET | SUBLINGUAL | Status: DC | PRN

## 2015-01-04 MED ORDER — CLOPIDOGREL BISULFATE 300 MG PO TABS
ORAL_TABLET | ORAL | Status: AC
  Filled 2015-01-04: qty 1

## 2015-01-04 MED ORDER — SODIUM CHLORIDE 0.9 % IJ SOLN: 3.0000 mL | INTRAMUSCULAR | Status: DC | PRN

## 2015-01-04 MED ORDER — SODIUM CHLORIDE 0.9 % IV SOLN
INTRAVENOUS | Status: AC
  Administered 2015-01-04: 13:00:00 via INTRAVENOUS

## 2015-01-04 MED ORDER — FENTANYL CITRATE (PF) 100 MCG/2ML IJ SOLN
INTRAMUSCULAR | Status: AC
  Filled 2015-01-04: qty 2

## 2015-01-04 MED ORDER — ACETAMINOPHEN 325 MG PO TABS: 650.0000 mg | ORAL_TABLET | ORAL | Status: DC | PRN

## 2015-01-04 MED ORDER — METOPROLOL SUCCINATE ER 25 MG PO TB24
25.0000 mg | ORAL_TABLET | Freq: Every day | ORAL | Status: DC
  Administered 2015-01-04 – 2015-01-05 (×2): 25 mg via ORAL
  Filled 2015-01-04 (×2): qty 1

## 2015-01-04 MED ORDER — HEPARIN SODIUM (PORCINE) 1000 UNIT/ML IJ SOLN
INTRAMUSCULAR | Status: DC | PRN
  Administered 2015-01-04: 5000 [IU] via INTRAVENOUS

## 2015-01-04 MED ORDER — ASPIRIN EC 325 MG PO TBEC
325.0000 mg | DELAYED_RELEASE_TABLET | Freq: Every day | ORAL | Status: DC
  Administered 2015-01-04 – 2015-01-05 (×2): 325 mg via ORAL
  Filled 2015-01-04 (×2): qty 1

## 2015-01-04 MED ORDER — MIDAZOLAM HCL 2 MG/2ML IJ SOLN
INTRAMUSCULAR | Status: AC
  Filled 2015-01-04: qty 2

## 2015-01-04 MED ORDER — CLOPIDOGREL BISULFATE 75 MG PO TABS
75.0000 mg | ORAL_TABLET | Freq: Every day | ORAL | Status: DC
  Administered 2015-01-05: 75 mg via ORAL
  Filled 2015-01-04: qty 1

## 2015-01-04 MED ORDER — IODIXANOL 320 MG/ML IV SOLN
INTRAVENOUS | Status: DC | PRN
  Administered 2015-01-04: 256 mL via INTRA_ARTERIAL

## 2015-01-04 MED ORDER — CLOPIDOGREL BISULFATE 300 MG PO TABS
ORAL_TABLET | ORAL | Status: DC | PRN
  Administered 2015-01-04: 300 mg via ORAL

## 2015-01-04 MED ORDER — ASPIRIN 81 MG PO CHEW
81.0000 mg | CHEWABLE_TABLET | ORAL | Status: AC
  Administered 2015-01-04: 81 mg via ORAL

## 2015-01-04 MED ORDER — NITROGLYCERIN 0.4 MG/SPRAY TL SOLN
Status: AC
  Filled 2015-01-04: qty 4.9

## 2015-01-04 MED ORDER — SODIUM CHLORIDE 0.9 % IV SOLN
INTRAVENOUS | Status: DC
  Administered 2015-01-04: 09:00:00 via INTRAVENOUS

## 2015-01-04 MED ORDER — LIDOCAINE HCL (PF) 1 % IJ SOLN
INTRAMUSCULAR | Status: AC
  Filled 2015-01-04: qty 30

## 2015-01-04 MED ORDER — HYDRALAZINE HCL 20 MG/ML IJ SOLN: 10.0000 mg | INTRAMUSCULAR | Status: DC | PRN

## 2015-01-04 MED ORDER — ASPIRIN 81 MG PO CHEW
CHEWABLE_TABLET | ORAL | Status: AC
  Administered 2015-01-04: 81 mg via ORAL
  Filled 2015-01-04: qty 1

## 2015-01-04 MED ORDER — ISOSORBIDE MONONITRATE ER 30 MG PO TB24
30.0000 mg | ORAL_TABLET | Freq: Every day | ORAL | Status: DC
  Administered 2015-01-04 – 2015-01-05 (×2): 30 mg via ORAL
  Filled 2015-01-04 (×2): qty 1

## 2015-01-04 SURGICAL SUPPLY — 22 items
BALLN LUTONIX 5X120X130 (BALLOONS) ×3
BALLOON LUTONIX 5X120X130 (BALLOONS) ×1 IMPLANT
CATH CROSS OVER TEMPO 5F (CATHETERS) ×3 IMPLANT
CATH HAWKONE LS STANDARD TIP (CATHETERS) ×3
CATH HAWKONE LS STD TIP (CATHETERS) ×1 IMPLANT
CATH INFINITI 5FR MULTPACK ANG (CATHETERS) IMPLANT
CATH NAVICROSS ST .035X135CM (MICROCATHETER) ×3 IMPLANT
DEVICE SPIDERFX EMB PROT 6MM (WIRE) ×3 IMPLANT
GLIDEWIRE ANGLED SS 035X260CM (WIRE) ×3 IMPLANT
GUIDEWIRE ANGLED .035X150CM (WIRE) ×3 IMPLANT
HAND CONTROLLER AVANTA (MISCELLANEOUS) IMPLANT
KIT ENCORE 26 ADVANTAGE (KITS) ×3 IMPLANT
KIT HEART LEFT (KITS) ×3 IMPLANT
PACK CARDIAC CATHETERIZATION (CUSTOM PROCEDURE TRAY) ×3 IMPLANT
SET AVANTA SINGLE PATIENT (MISCELLANEOUS) IMPLANT
SHEATH HIGHFLEX ANSEL 7FR 55CM (SHEATH) ×3 IMPLANT
SHEATH PINNACLE 5F 10CM (SHEATH) IMPLANT
SHEATH PINNACLE 7F 10CM (SHEATH) ×3 IMPLANT
TAPE RADIOPAQUE TURBO (MISCELLANEOUS) ×3 IMPLANT
TRANSDUCER W/STOPCOCK (MISCELLANEOUS) ×3 IMPLANT
WIRE HITORQ VERSACORE ST 145CM (WIRE) ×3 IMPLANT
WIRE ROSEN-J .035X180CM (WIRE) ×3 IMPLANT

## 2015-01-04 NOTE — Progress Notes (Signed)
Sheath Pull Note  7Fr arterial sheath pulled from right femoral artery by Michaelle Birks, RT(R).  Manual pressure held for 20 min.   No complications, vitals stable during sheath pull.    Right groin level 0 post sheath pull, site dressed with gauze and clear tegaderm.  Patient education done.

## 2015-01-04 NOTE — Interval H&P Note (Signed)
History and Physical Interval Note:  01/04/2015 10:35 AM  Tony Curtis  has presented today for surgery, with the diagnosis of claudication/cp  The various methods of treatment have been discussed with the patient and family. After consideration of risks, benefits and other options for treatment, the patient has consented to  Procedure(s): Lower Extremity Angiography (N/A) Left Heart Cath and Cors/Grafts Angiography (N/A) as a surgical intervention .  The patient's history has been reviewed, patient examined, no change in status, stable for surgery.  I have reviewed the patient's chart and labs.  Questions were answered to the patient's satisfaction.     [name]

## 2015-01-04 NOTE — H&P (View-Only) (Signed)
12/11/2014 Tony Curtis   10/07/1935  [id number]  Primary Physician No PCP Per Patient Primary Cardiologist: Lorretta Harp MD [name]   HPI:  The patient is a 78y/o AAM, followed at [facility] by Dr. [name]. He has known CAD, s/p CABG x 3 in 2009. He received a LIMA to LAD, a vein graft to the diagonal branch and a vein graft to the ramus branch. A repeat cardiac cath in 2012 revealed a patent LIMA graft, but both vein grafts were found to be occluded. He also has peripheral vascular disease, s/p PTA and stenting of his right SFA in 2012, as well as hypertension and hyperlipidemia. He presented to [facility] on 6/23 with a complaint of intermittent substernal chest pain, over the last several days, that was worse yesterday evening. The pain is similar to his angina before is CABG. The pain has become more frequent and wakes him from his sleep. It radiates to the left jaw back and bilateral scapula. The pain is non-exertional. Last PM, the pain was 10/10. It was responsive to SL NTG. He denies any other associated symptoms. He ruled out for myocardial infarction with negative enzymes. He underwent Myoview stress testing and developed chest pain and ST segment changes during Lexiscan infusion. Because of this I performed a catheterization on him 02/05/13 revealing essentially unchanged anatomy. He does complain of right calf claudication and recurrent chest pain. A recent Myoview was low-risk rule out semi-Dopplers performed 12/03/14 revealed a right ABI 0.44 with an occluded distal right ft SFA and popliteal artery.  Current Outpatient Prescriptions  Medication Sig Dispense Refill  . aspirin EC 81 MG tablet Take 81 mg by mouth daily.    . cholecalciferol (VITAMIN D) 1000 UNITS tablet Take 1,000 Units by mouth daily.    . isosorbide mononitrate (IMDUR) 30 MG 24 hr tablet Take 1 tablet (30 mg total) by mouth daily. 30 tablet 6  . metoprolol succinate (TOPROL XL) 25 MG 24 hr tablet Take 1 tablet  (25 mg total) by mouth daily. 30 tablet 6  . nitroGLYCERIN (NITROSTAT) 0.4 MG SL tablet Place 0.4 mg under the tongue every 5 (five) minutes as needed for chest pain.    [name] Glycol-Propyl Glycol (SYSTANE) 0.4-0.3 % SOLN Place 1 drop into both eyes 4 (four) times daily as needed (dry eyes).    Marland Kitchen PRESCRIPTION MEDICATION Apply 1 application topically at bedtime as needed (for back and neck pain). Medicated cream from [place]     No current facility-administered medications for this visit.    No Known Allergies  History   Social History  . Marital Status: Married    Spouse Name: N/A  . Number of Children: N/A  . Years of Education: N/A   Occupational History  . Not on file.   Social History Main Topics  . Smoking status: Former Smoker    Quit date: 06/14/1979  . Smokeless tobacco: Never Used  . Alcohol Use: Yes     Comment: occasional wine  . Drug Use: No  . Sexual Activity: Not on file   Other Topics Concern  . Not on file   Social History Narrative     Review of Systems: General: negative for chills, fever, night sweats or weight changes.  Cardiovascular: negative for chest pain, dyspnea on exertion, edema, orthopnea, palpitations, paroxysmal nocturnal dyspnea or shortness of breath Dermatological: negative for rash Respiratory: negative for cough or wheezing Urologic: negative for hematuria Abdominal: negative for nausea, vomiting, diarrhea,  bright red blood per rectum, melena, or hematemesis Neurologic: negative for visual changes, syncope, or dizziness All other systems reviewed and are otherwise negative except as noted above.    Blood pressure 146/80, pulse 60, height 5\' 10"  (1.778 m), weight 126 lb (57.153 kg).  General appearance: alert and no distress Neck: no adenopathy, no carotid bruit, no JVD, supple, symmetrical, trachea midline and thyroid not enlarged, symmetric, no tenderness/mass/nodules Lungs: clear to auscultation bilaterally Heart: regular rate  and rhythm, S1, S2 normal, no murmur, click, rub or gallop Extremities: extremities normal, atraumatic, no cyanosis or edema  EKG not performed today  ASSESSMENT AND PLAN:   PVD - Rt SFA PTA 8/112 History of peripheral vascular disease status post right SFA stenting in 2012 with recurrent right calf claudication and recent Dopplers showing a right ABI of 0.44 with an occluded right SFA and popliteal artery. This was done on 4/20 once a 16. Patient will need angiography and potential re-intervention.   CAD, CABG X 3 9/09. Cath 2012 and 02/05/13- 2/3 grafts occluded- medical Rx History of CAD status post coronary artery bypass grafting in 2009 with a LIMA to his LAD, vein graft to diagonal branch and ramus branch branch. He had repeat catheterization 2002 revealing a patent LIMA occluded veins. He underwent repeat catheterization by myself 02/05/13 revealing unchanged anatomy. He briefly had a Myoview stress test performed one week ago that showed a small apical defect that was low risk. He continues to have chest pain. I plan on performing diagnostic coronary arteriography at that same time as peripheral angiography on 12/31/14.   Hyperlipemia History of hyperlipidemia not on statin therapy. We will recheck a lipid and liver profile   HTN (hypertension) History of hypertension blood pressure measured at 146/80. He is on metoprolol. Continue current meds at current dosing       Lorretta Harp MD [facility], [facility] 12/11/2014 10:52 AM

## 2015-01-05 ENCOUNTER — Other Ambulatory Visit: Payer: Self-pay | Admitting: Cardiology

## 2015-01-05 DIAGNOSIS — I209 Angina pectoris, unspecified: Secondary | ICD-10-CM

## 2015-01-05 DIAGNOSIS — I739 Peripheral vascular disease, unspecified: Secondary | ICD-10-CM | POA: Diagnosis not present

## 2015-01-05 DIAGNOSIS — I70211 Atherosclerosis of native arteries of extremities with intermittent claudication, right leg: Secondary | ICD-10-CM | POA: Diagnosis not present

## 2015-01-05 DIAGNOSIS — N289 Disorder of kidney and ureter, unspecified: Secondary | ICD-10-CM

## 2015-01-05 DIAGNOSIS — I25700 Atherosclerosis of coronary artery bypass graft(s), unspecified, with unstable angina pectoris: Secondary | ICD-10-CM | POA: Diagnosis not present

## 2015-01-05 LAB — BASIC METABOLIC PANEL
Anion gap: 5 (ref 5–15)
BUN: 18 mg/dL (ref 6–20)
CALCIUM: 8.4 mg/dL — AB (ref 8.9–10.3)
CHLORIDE: 108 mmol/L (ref 101–111)
CO2: 23 mmol/L (ref 22–32)
Creatinine, Ser: 1.26 mg/dL — ABNORMAL HIGH (ref 0.61–1.24)
GFR calc non Af Amer: 54 mL/min — ABNORMAL LOW (ref >60)
Glucose, Bld: 96 mg/dL (ref 65–99)
POTASSIUM: 4 mmol/L (ref 3.5–5.1)
Sodium: 136 mmol/L (ref 135–145)

## 2015-01-05 LAB — CBC
HCT: 37.8 % — ABNORMAL LOW (ref 39.0–52.0)
Hemoglobin: 12.7 g/dL — ABNORMAL LOW (ref 13.0–17.0)
MCH: 30 pg (ref 26.0–34.0)
MCHC: 33.6 g/dL (ref 30.0–36.0)
MCV: 89.4 fL (ref 78.0–100.0)
PLATELETS: 168 10*3/uL (ref 150–400)
RBC: 4.23 MIL/uL (ref 4.22–5.81)
RDW: 13.6 % (ref 11.5–15.5)
WBC: 9.9 10*3/uL (ref 4.0–10.5)

## 2015-01-05 MED ORDER — CLOPIDOGREL BISULFATE 75 MG PO TABS: 75.0000 mg | ORAL_TABLET | Freq: Every day | ORAL | Status: DC

## 2015-01-05 MED ORDER — ACETAMINOPHEN 325 MG PO TABS: 650.0000 mg | ORAL_TABLET | ORAL | Status: AC | PRN

## 2015-01-05 MED FILL — Heparin Sodium (Porcine) 2 Unit/ML in Sodium Chloride 0.9%: INTRAMUSCULAR | Qty: 1500 | Status: AC

## 2015-01-05 MED FILL — Lidocaine HCl Local Preservative Free (PF) Inj 1%: INTRAMUSCULAR | Qty: 30 | Status: AC

## 2015-01-05 NOTE — Discharge Summary (Addendum)
Patient ID: Tony Curtis,  MRN: [id number], DOB/AGE: Mar 02, 1936 79 y.o.  Admit date: 01/04/2015 Discharge date: 01/05/2015  Primary Care Provider: Pcp Not In System Primary Cardiologist: Dr [name]  Discharge Diagnoses Active Problems:   PVD - Rt SFA PTA 8/112   Claudication   Pain in the chest   Coronary artery disease involving coronary bypass graft of native heart with unstable angina pectoris    Procedures: Coronary and peripheral angiogram  With Rt SFA PTA 01/04/15   Hospital Course:  78y/o AAM, followed at [facility] by Dr. [name]. He has known CAD, s/p CABG x 3 in 2009. He received a LIMA to LAD, a vein graft to the diagonal branch and a vein graft to the ramus branch. A repeat cardiac cath in 2012 revealed a patent LIMA graft, but both vein grafts were found to be occluded. He also has peripheral vascular disease, s/p PTA and stenting of his right SFA in 2012, as well as hypertension and hyperlipidemia. A catheterization on 02/05/13 revealed essentially unchanged anatomy. He does complain of right calf claudication and recurrent chest pain. A recent Myoview was low-risk and lower extremity Dopplers performed 12/03/14 revealed a right ABI 0.44 with an occluded distal right ft SFA and popliteal artery. Myoview was low risk. The patient was admitted 01/04/15 for abdominal aortography, and coronary/graft angiogram secondary to persistent chest pain. Coronary angiogram revealed occluded vein grafts to the ramus branch and diagonal branch with a patent LIMA to the LAD. He has a left dominant system with moderate disease in his circumflex coronary artery  The plan is for medical Rx.          PV angiogram revealed an occluded Rt SFA. He underwent successful [place] 1 directional atherectomy followed by drug-eluting balloon angioplasty of a short segment CTO for lifestyle limiting claudication. He tolerated this well. He was hydrated overnight and we feel he can be discharged 01/05/15. He'll get LEA  dopplers as an OP as well as a BMP 01/12/15.  He has a f/u with Dr [name] June 1, he already had this appointment.   Discharge Vitals:  Blood pressure 103/58, pulse 80, temperature 97.7 F (36.5 C), temperature source Oral, resp. rate 20, height 5\' 10"  (1.778 m), weight 122 lb 2.2 oz (55.4 kg), SpO2 96 %.    Labs: Results for orders placed or performed during the hospital encounter of 01/04/15 (from the past 24 hour(s))  POCT Activated clotting time     Status: None   Collection Time: 01/04/15 11:35 AM  Result Value Ref Range   Activated Clotting Time 270 seconds  POCT Activated clotting time     Status: None   Collection Time: 01/04/15 12:32 PM  Result Value Ref Range   Activated Clotting Time 165 seconds  POCT Activated clotting time     Status: None   Collection Time: 01/04/15  1:11 PM  Result Value Ref Range   Activated Clotting Time 147 seconds  Basic metabolic panel      Status: Abnormal   Collection Time: 01/05/15  3:54 AM  Result Value Ref Range   Sodium 136 135 - 145 mmol/L   Potassium 4.0 3.5 - 5.1 mmol/L   Chloride 108 101 - 111 mmol/L   CO2 23 22 - 32 mmol/L   Glucose, Bld 96 65 - 99 mg/dL   BUN 18 6 - 20 mg/dL   Creatinine, Ser 1.26 (H) 0.61 - 1.24 mg/dL   Calcium 8.4 (L) 8.9 - 10.3 mg/dL  GFR calc non Af Amer 54 (L) >60 mL/min   GFR calc Af Amer >60 >60 mL/min   Anion gap 5 5 - 15  CBC     Status: Abnormal   Collection Time: 01/05/15  3:54 AM  Result Value Ref Range   WBC 9.9 4.0 - 10.5 K/uL   RBC 4.23 4.22 - 5.81 MIL/uL   Hemoglobin 12.7 (L) 13.0 - 17.0 g/dL   HCT 37.8 (L) 39.0 - 52.0 %   MCV 89.4 78.0 - 100.0 fL   MCH 30.0 26.0 - 34.0 pg   MCHC 33.6 30.0 - 36.0 g/dL   RDW 13.6 11.5 - 15.5 %   Platelets 168 150 - 400 K/uL    Disposition:      Follow-up Information    Follow up with [name], MD On 01/13/2015.   Specialties:  Cardiology, Radiology   Why:  11:30   Contact information:   [address] [place]  [address] [phone]       Discharge Medications:    Medication List    TAKE these medications        acetaminophen 325 MG tablet  Commonly known as:  TYLENOL  Take 2 tablets (650 mg total) by mouth every 4 (four) hours as needed for headache or mild pain.     aspirin EC 81 MG tablet  Take 81 mg by mouth daily.     cetirizine 10 MG tablet  Commonly known as:  ZYRTEC  Take 10 mg by mouth daily as needed for allergies.     cholecalciferol 1000 UNITS tablet  Commonly known as:  VITAMIN D  Take 1,000 Units by mouth daily.     clopidogrel 75 MG tablet  Commonly known as:  PLAVIX  Take 1 tablet (75 mg total) by mouth daily with breakfast.     isosorbide mononitrate 30 MG 24 hr tablet  Commonly known as:  IMDUR  Take 1 tablet (30 mg total) by mouth daily.     metoprolol succinate 25 MG 24 hr tablet  Commonly known as:  TOPROL XL  Take 1 tablet (25 mg total) by mouth daily.     nitroGLYCERIN 0.4 MG SL tablet  Commonly known as:  NITROSTAT  Place 1 tablet (0.4 mg total) under the tongue every 5 (five) minutes as needed for chest pain.     PRESCRIPTION MEDICATION  Apply 1 application topically at bedtime as needed (for back and neck pain). Medicated cream from [facility]     SYSTANE 0.4-0.3 % Soln  Generic drug:  Polyethyl Glycol-Propyl Glycol  Place 1 drop into both eyes 4 (four) times daily as needed (dry eyes).         Duration of Discharge Encounter: Greater than 30 minutes including physician time.  Angelena Form PA-C 01/05/2015 9:44 AM   Agree with note written by Kerin Ransom [facility]  [name] had successful [facility] 1 directional atherectomy of a distal right SFA CTO followed by drug-eluting balloon angioplasty.. He had occluded vein graft to the patent LIMA to his LAD. His diagonal branch filled retrograde. We will continue medical therapy for his CAD. He'll be discharged home this morning and obtained lower extremity arterial Doppler studies in our [place] office  next week. He will see me back in 2-3 weeks for follow-up. He'll be on dual antiplatelets therapy.[name] 01/05/2015 10:38 AM

## 2015-01-05 NOTE — Discharge Instructions (Signed)
Angiogram °An angiogram, also called angiography, is a procedure used to look at the blood vessels that carry blood to different parts of your body (arteries). In this procedure, dye is injected through a long, thin tube (catheter) into an artery. X-rays are then taken. The X-rays will show if there is a blockage or problem in a blood vessel.  °LET YOUR HEALTH CARE PROVIDER KNOW ABOUT: °· Any allergies you have, including allergies to shellfish or contrast dye.   °· All medicines you are taking, including vitamins, herbs, eye drops, creams, and over-the-counter medicines.   °· Previous problems you or members of your family have had with the use of anesthetics.   °· Any blood disorders you have.   °· Previous surgeries you have had. °· Any previous kidney problems or failure you have had.  °· Medical conditions you have.   °· Possibility of pregnancy, if this applies. °RISKS AND COMPLICATIONS °Generally, an angiogram is a safe procedure. However, as with any procedure, problems can occur. Possible problems include: °· Injury to the blood vessels, including rupture or bleeding. °· Infection or bruising at the catheter site. °· Allergic reaction to the dye or contrast used. °· Kidney damage from the dye or contrast used. °· Blood clots that can lead to a stroke or heart attack. °BEFORE THE PROCEDURE °· Do not eat or drink after midnight on the night before the procedure, or as directed by your health care provider.   °· Ask your health care provider if you may drink enough water to take any needed medicines the morning of the procedure.   °PROCEDURE °· You may be given a medicine to help you relax (sedative) before and during the procedure. This medicine is given through an IV access tube that is inserted into one of your veins.   °· The area where the catheter will be inserted will be washed and shaved. This is usually done in the groin but may be done in the fold of your arm (near your elbow) or in the wrist. °· A  medicine will be given to numb the area where the catheter will be inserted (local anesthetic). °· The catheter will be inserted with a guide wire into an artery. The catheter is guided by using a type of X-ray (fluoroscopy) to the blood vessel being examined.   °· Dye is then injected into the catheter, and X-rays are taken. The dye helps to show where any narrowing or blockages are located.   °AFTER THE PROCEDURE  °· If the procedure is done through the leg, you will be kept in bed lying flat for several hours. You will be instructed to not bend or cross your legs. °· The insertion site will be checked frequently. °· The pulse in your feet or wrist will be checked frequently. °· Additional blood tests, X-rays, and electrocardiography may be done.   °· You may need to stay in the hospital overnight for observation.   °Document Released: 05/10/2005 Document Revised: 08/05/2013 Document Reviewed: 01/01/2013 °ExitCare® Patient Information ©2015 [employer]. This information is not intended to replace advice given to you by your health care provider. Make sure you discuss any questions you have with your health care provider. ° °

## 2015-01-13 ENCOUNTER — Encounter: Payer: Self-pay | Admitting: Cardiovascular Disease

## 2015-01-13 ENCOUNTER — Ambulatory Visit (INDEPENDENT_AMBULATORY_CARE_PROVIDER_SITE_OTHER): Payer: Medicare Other | Admitting: Cardiovascular Disease

## 2015-01-13 VITALS — BP 122/64 | HR 82 | Ht 70.0 in | Wt 124.0 lb

## 2015-01-13 DIAGNOSIS — I10 Essential (primary) hypertension: Secondary | ICD-10-CM | POA: Diagnosis not present

## 2015-01-13 DIAGNOSIS — I2583 Coronary atherosclerosis due to lipid rich plaque: Secondary | ICD-10-CM

## 2015-01-13 DIAGNOSIS — I2510 Atherosclerotic heart disease of native coronary artery without angina pectoris: Secondary | ICD-10-CM | POA: Diagnosis not present

## 2015-01-13 DIAGNOSIS — E785 Hyperlipidemia, unspecified: Secondary | ICD-10-CM | POA: Diagnosis not present

## 2015-01-13 DIAGNOSIS — I739 Peripheral vascular disease, unspecified: Secondary | ICD-10-CM

## 2015-01-13 MED ORDER — PRAVASTATIN SODIUM 40 MG PO TABS: 40.0000 mg | ORAL_TABLET | Freq: Every evening | ORAL | Status: DC

## 2015-01-13 NOTE — Patient Instructions (Signed)
Medication Instructions:   Start Pravastatin 40mg  daily, I have sent the prescription into the pharmacy for you.  Labwork:  n/a  Testing/Procedures:  Lower extremity arterial doppler- During this test, ultrasound is used to evaluate arterial blood flow in the legs. Allow approximately one hour for this exam.    Follow-Up:  6 months with Dr [name]  Any Other Special Instructions Will Be Listed Below (If Applicable).

## 2015-01-13 NOTE — Assessment & Plan Note (Signed)
History of peripheral arterial disease status post right SFA PTA August 2012. He had recurrent right lower extremity claudication with Dopplers that showed a decline in his right ABI to 0.44 with occluded distal right SFA and popliteal artery. Angiograms him 01/04/15 demonstrating this and performed  [facility] 1 directional atherectomy of his short segment chronic total occlusion distal right SFA followed by drug-eluting balloon angioplasty. He did have 2 vessel runoff with occluded anterior tibial. He had excellent angiographic result. He has a palpable pedal pulse and he feels clinically improved. We will get lower extremity arterial Doppler studies.

## 2015-01-13 NOTE — Assessment & Plan Note (Signed)
History of hypertension blood pressure measures 122/64. He is on metoprolol. Continue current meds at current dosing

## 2015-01-13 NOTE — Progress Notes (Signed)
01/13/2015 Tony Curtis   Apr 21, 1936  [id number]  Primary Physician Pcp Not In System Primary Cardiologist: Lorretta Harp MD Tony Curtis   HPI:  The patient is a 79y/o AAM, followed  by Dr. [name]. He has known CAD, s/p CABG x 3 in 2009. He received a LIMA to LAD, a vein graft to the diagonal branch and a vein graft to the ramus branch. A repeat cardiac cath in 2012 revealed a patent LIMA graft, but both vein grafts were found to be occluded. He also has peripheral vascular disease, s/p PTA and stenting of his right SFA in 2012, as well as hypertension and hyperlipidemia. He presented to [facility] on 6/23 with a complaint of intermittent substernal chest pain, over the last several days, that was worse yesterday evening. The pain is similar to his angina before is CABG. The pain has become more frequent and wakes him from his sleep. It radiates to the left jaw back and bilateral scapula. The pain is non-exertional. Last PM, the pain was 10/10. It was responsive to SL NTG. He denies any other associated symptoms. He ruled out for myocardial infarction with negative enzymes. He underwent Myoview stress testing and developed chest pain and ST segment changes during Lexiscan infusion. Because of this I performed a catheterization on him 02/05/13 revealing essentially unchanged anatomy. He does complain of right calf claudication and recurrent chest pain. A recent Myoview was low-risk rule out semi-Dopplers performed 12/03/14 revealed a right ABI 0.44 with an occluded distal right ft SFA and popliteal artery. I performed cardiac catheterization on him on 01/04/15 revealing unchanged anatomy from his prior cath with a preliminary LAD. I also performed peripheral angiography revealing a short segment occlusion of the distal right superficial femoral artery proximal to a previously placed stent. I was able to cross this and performed directional atherectomy with PTA using drug-eluting stent with excellent  angiographic result. His claudication has improved.    Current Outpatient Prescriptions  Medication Sig Dispense Refill  . acetaminophen (TYLENOL) 325 MG tablet Take 2 tablets (650 mg total) by mouth every 4 (four) hours as needed for headache or mild pain.    Marland Kitchen aspirin EC 81 MG tablet Take 81 mg by mouth daily.    . cetirizine (ZYRTEC) 10 MG tablet Take 10 mg by mouth daily as needed for allergies.    . cholecalciferol (VITAMIN D) 1000 UNITS tablet Take 1,000 Units by mouth daily.    . clopidogrel (PLAVIX) 75 MG tablet Take 1 tablet (75 mg total) by mouth daily with breakfast. 90 tablet 11  . isosorbide mononitrate (IMDUR) 30 MG 24 hr tablet Take 1 tablet (30 mg total) by mouth daily. 30 tablet 6  . metoprolol succinate (TOPROL XL) 25 MG 24 hr tablet Take 1 tablet (25 mg total) by mouth daily. 30 tablet 6  . nitroGLYCERIN (NITROSTAT) 0.4 MG SL tablet Place 1 tablet (0.4 mg total) under the tongue every 5 (five) minutes as needed for chest pain. 25 tablet 1  . Polyethyl Glycol-Propyl Glycol (SYSTANE) 0.4-0.3 % SOLN Place 1 drop into both eyes 4 (four) times daily as needed (dry eyes).    Marland Kitchen PRESCRIPTION MEDICATION Apply 1 application topically at bedtime as needed (for back and neck pain). Medicated cream from [place]    . pravastatin (PRAVACHOL) 40 MG tablet Take 1 tablet (40 mg total) by mouth every evening. 90 tablet 3   No current facility-administered medications for this visit.    No Known  Allergies  History   Social History  . Marital Status: Married    Spouse Name: N/A  . Number of Children: N/A  . Years of Education: N/A   Occupational History  . Not on file.   Social History Main Topics  . Smoking status: Former Smoker -- 0.10 packs/day for 20 years    Types: Cigarettes    Quit date: 06/14/1979  . Smokeless tobacco: Never Used  . Alcohol Use: Yes     Comment: 01/04/2015 "might have a glass of wine q couple years"  . Drug Use: No  . Sexual Activity: Not on file   Other  Topics Concern  . Not on file   Social History Narrative     Review of Systems: General: negative for chills, fever, night sweats or weight changes.  Cardiovascular: negative for chest pain, dyspnea on exertion, edema, orthopnea, palpitations, paroxysmal nocturnal dyspnea or shortness of breath Dermatological: negative for rash Respiratory: negative for cough or wheezing Urologic: negative for hematuria Abdominal: negative for nausea, vomiting, diarrhea, bright red blood per rectum, melena, or hematemesis Neurologic: negative for visual changes, syncope, or dizziness All other systems reviewed and are otherwise negative except as noted above.    Blood pressure 122/64, pulse 82, height 5\' 10"  (1.778 m), weight 124 lb (56.246 kg).  General appearance: alert and no distress Neck: no adenopathy, no carotid bruit, no JVD, supple, symmetrical, trachea midline and thyroid not enlarged, symmetric, no tenderness/mass/nodules Lungs: clear to auscultation bilaterally Heart: regular rate and rhythm, S1, S2 normal, no murmur, click, rub or gallop Extremities: extremities normal, atraumatic, no cyanosis or edema and 2+ right pedal pulse  EKG not performed today  ASSESSMENT AND PLAN:   PVD - Rt SFA PTA 8/112 History of peripheral arterial disease status post right SFA PTA August 2012. He had recurrent right lower extremity claudication with Dopplers that showed a decline in his right ABI to 0.44 with occluded distal right SFA and popliteal artery. Angiograms him 01/04/15 demonstrating this and performed  [facility] 1 directional atherectomy of his short segment chronic total occlusion distal right SFA followed by drug-eluting balloon angioplasty. He did have 2 vessel runoff with occluded anterior tibial. He had excellent angiographic result. He has a palpable pedal pulse and he feels clinically improved. We will get lower extremity arterial Doppler studies.   Hyperlipemia History of hyperlipidemia on  pravastatin in the past. Apparently that was stopped back in April and his follow-up lipid profile revealed a total cholesterol 188, LDL 109 and a chill 52 performed 12/29/14. I'm going to restart his pravastatin and recheck a lipid liver profile   HTN (hypertension) History of hypertension blood pressure measures 122/64. He is on metoprolol. Continue current meds at current dosing   CAD, CABG X 3 9/09. Cath 2012 and 02/05/13- 2/3 grafts occluded- medical Rx History of coronary artery disease status post coronary artery bypass grafting in 2009 with a LIMA to his LAD, vein graft to diagonal branch and to the ramus branch. Repeat crit Catheterization performed 2012 revealed a patent LIMA graft with occluded vein grafts. Because of chest pain he was recathed 02/05/13 revealing unchanged anatomy. He has had recurrent chest pain and underwent recath on 523 essentially revealing unchanged anatomy with a patent LIMA to the LAD and occluded vein grafts. His left dominant circumflex had a 60% lesion in the midportion. I do not think that he had a culprit lesion and had stable anatomy. He is on oral nitrate. His pain is now minimal.  Lorretta Harp MD FACP,FACC,FAHA, [facility] 01/13/2015 12:13 PM

## 2015-01-13 NOTE — Assessment & Plan Note (Signed)
History of hyperlipidemia on pravastatin in the past. Apparently that was stopped back in April and his follow-up lipid profile revealed a total cholesterol 188, LDL 109 and a chill 52 performed 12/29/14. I'm going to restart his pravastatin and recheck a lipid liver profile

## 2015-01-13 NOTE — Assessment & Plan Note (Signed)
History of coronary artery disease status post coronary artery bypass grafting in 2009 with a LIMA to his LAD, vein graft to diagonal branch and to the ramus branch. Repeat crit Catheterization performed 2012 revealed a patent LIMA graft with occluded vein grafts. Because of chest pain he was recathed 02/05/13 revealing unchanged anatomy. He has had recurrent chest pain and underwent recath on 523 essentially revealing unchanged anatomy with a patent LIMA to the LAD and occluded vein grafts. His left dominant circumflex had a 60% lesion in the midportion. I do not think that he had a culprit lesion and had stable anatomy. He is on oral nitrate. His pain is now minimal.

## 2015-01-25 ENCOUNTER — Encounter (HOSPITAL_COMMUNITY): Payer: Self-pay | Admitting: Cardiovascular Disease

## 2015-02-12 ENCOUNTER — Ambulatory Visit (HOSPITAL_COMMUNITY)
Admission: RE | Admit: 2015-02-12 | Discharge: 2015-02-12 | Disposition: A | Discharge status: Home / Self Care | Payer: Medicare Other | Source: Ambulatory Visit | Attending: Cardiology | Admitting: Cardiology

## 2015-02-12 DIAGNOSIS — I739 Peripheral vascular disease, unspecified: Secondary | ICD-10-CM | POA: Insufficient documentation

## 2015-07-19 ENCOUNTER — Other Ambulatory Visit: Payer: Self-pay | Admitting: Cardiovascular Disease

## 2015-07-19 DIAGNOSIS — I10 Essential (primary) hypertension: Secondary | ICD-10-CM

## 2015-07-19 DIAGNOSIS — I739 Peripheral vascular disease, unspecified: Secondary | ICD-10-CM

## 2015-07-19 MED ORDER — ISOSORBIDE MONONITRATE ER 30 MG PO TB24: 30.0000 mg | ORAL_TABLET | Freq: Every day | ORAL | Status: DC

## 2015-08-02 ENCOUNTER — Other Ambulatory Visit: Payer: Self-pay | Admitting: Cardiovascular Disease

## 2015-08-02 DIAGNOSIS — I739 Peripheral vascular disease, unspecified: Secondary | ICD-10-CM

## 2015-08-17 ENCOUNTER — Ambulatory Visit (HOSPITAL_COMMUNITY)
Admission: RE | Admit: 2015-08-17 | Discharge: 2015-08-17 | Disposition: A | Discharge status: Home / Self Care | Payer: Medicare Other | Source: Ambulatory Visit | Attending: Urology | Admitting: Urology

## 2015-08-17 DIAGNOSIS — I10 Essential (primary) hypertension: Secondary | ICD-10-CM | POA: Diagnosis not present

## 2015-08-17 DIAGNOSIS — I70201 Unspecified atherosclerosis of native arteries of extremities, right leg: Secondary | ICD-10-CM | POA: Insufficient documentation

## 2015-08-17 DIAGNOSIS — E785 Hyperlipidemia, unspecified: Secondary | ICD-10-CM | POA: Diagnosis not present

## 2015-08-17 DIAGNOSIS — I739 Peripheral vascular disease, unspecified: Secondary | ICD-10-CM | POA: Insufficient documentation

## 2015-08-20 ENCOUNTER — Telehealth: Payer: Self-pay | Admitting: Cardiovascular Disease

## 2015-08-20 NOTE — Telephone Encounter (Signed)
Pt called in stating that he is returning a call from Dr. Kennon Holter nurse . Please call back  Thanks

## 2015-08-20 NOTE — Telephone Encounter (Signed)
  Notes Recorded by Newt Minion, RN on 08/19/2015 at 8:48 AM Left message for patient to call back.   ------  Notes Recorded by Lorretta Harp, MD on 08/17/2015 at 3:44 PM Return office visit with me to discuss results at next available     Spoke with patient. Scheduled for Dr. Kennon Holter next available appointment: 10/01/15 2:45pm. Advised patient that if Dr. [name] would like to see patient sooner than this, his nurse will call him back to reschedule. Pt verbalizes understanding and agreement.

## 2015-10-01 ENCOUNTER — Encounter: Payer: Self-pay | Admitting: Cardiovascular Disease

## 2015-10-01 ENCOUNTER — Ambulatory Visit (INDEPENDENT_AMBULATORY_CARE_PROVIDER_SITE_OTHER): Payer: Medicare Other | Admitting: Cardiovascular Disease

## 2015-10-01 VITALS — BP 146/88 | HR 85 | Ht 70.0 in | Wt 136.0 lb

## 2015-10-01 DIAGNOSIS — I10 Essential (primary) hypertension: Secondary | ICD-10-CM

## 2015-10-01 DIAGNOSIS — I739 Peripheral vascular disease, unspecified: Secondary | ICD-10-CM

## 2015-10-01 DIAGNOSIS — E785 Hyperlipidemia, unspecified: Secondary | ICD-10-CM

## 2015-10-01 DIAGNOSIS — I2583 Coronary atherosclerosis due to lipid rich plaque: Secondary | ICD-10-CM

## 2015-10-01 DIAGNOSIS — I2510 Atherosclerotic heart disease of native coronary artery without angina pectoris: Secondary | ICD-10-CM | POA: Diagnosis not present

## 2015-10-01 NOTE — Assessment & Plan Note (Signed)
History of hypertension with blood pressure measured at 146/88. He is on metoprolol. Continue current meds at current dosing

## 2015-10-01 NOTE — Assessment & Plan Note (Signed)
History of peripheral arterial disease status post right SFA stenting August 2012. He had repeat angiography 01/04/2015 revealing a short segment occlusion mid right SFA was 60% "in-stent restenosis within the distal right SFA stent. I performed rotational atherectomy and drug-eluting agent plasty of his short segment occlusion with an excellent angiographic result. His claudication resolved. Recent Dopplers however performed 08/17/15 showed a decline in his right ABI from 1-0.88 with a new high-frequency signal in his distal right SFA. He is currently asymptomatic.

## 2015-10-01 NOTE — Patient Instructions (Signed)
Medication Instructions:  Your physician recommends that you continue on your current medications as directed. Please refer to the Current Medication list given to you today.   Labwork: none  Testing/Procedures: Your physician has requested that you have a lower extremity arterial doppler- During this test, ultrasound is used to evaluate arterial blood flow in the legs. Allow approximately one hour for this exam. IN 6 MONTHS  Follow-Up: Your physician wants you to follow-up in: 6 months with Dr. [name]. You will receive a reminder letter in the mail two months in advance. If you don't receive a letter, please call our office to schedule the follow-up appointment.   Any Other Special Instructions Will Be Listed Below (If Applicable).     If you need a refill on your cardiac medications before your next appointment, please call your pharmacy.

## 2015-10-01 NOTE — Assessment & Plan Note (Signed)
History of hyperlipidemia on pravastatin with recent lipid profile performed 12/29/14 revealing total cholesterol 188, LDL 109 and an HDL of 52.

## 2015-10-01 NOTE — Progress Notes (Signed)
10/01/2015 Tony Curtis   04/08/1936  [id number]  Primary Physician Pcp Not In System Primary Cardiologist: Lorretta Harp MD Tony Curtis   HPI:  The patient is a 80 year old AAM, followed by Dr. Gwenlyn Curtis. I last saw him in the office 01/13/15.He has known CAD, s/p CABG x 3 in 2009. He received a LIMA to LAD, a vein graft to the diagonal branch and a vein graft to the ramus branch. A repeat cardiac cath in 2012 revealed a patent LIMA graft, but both vein grafts were Curtis to be occluded. He also has peripheral vascular disease, s/p PTA and stenting of his right SFA in 2012, as well as hypertension and hyperlipidemia. He presented to [facility] on 6/23 with a complaint of intermittent substernal chest pain, over the last several days, that was worse yesterday evening. The pain is similar to his angina before is CABG. The pain has become more frequent and wakes him from his sleep. It radiates to the left jaw back and bilateral scapula. The pain is non-exertional. Last PM, the pain was 10/10. It was responsive to SL NTG. He denies any other associated symptoms. He ruled out for myocardial infarction with negative enzymes. He underwent Myoview stress testing and developed chest pain and ST segment changes during Lexiscan infusion. Because of this I performed a catheterization on him 02/05/13 revealing essentially unchanged anatomy. He does complain of right calf claudication and recurrent chest pain. A recent Myoview was low-risk rule out semi-Dopplers performed 12/03/14 revealed a right ABI 0.44 with an occluded distal right ft SFA and popliteal artery. I performed cardiac catheterization on him on 01/04/15 revealing unchanged anatomy from his prior cath with a preliminary LAD. I also performed peripheral angiography revealing a short segment occlusion of the distal right superficial femoral artery proximal to a previously placed stent. I was able to cross this and performed directional atherectomy with  PTA using drug-eluting stent with excellent angiographic result. His claudication has improved. Since I saw him back in June 2016 he's done well. He has occasional atypical chest pain. He has had no recurrent claudication although his recent Dopplers did show a decline in his right ABI from 1-0.88 with a new high-frequency signal in the distal right SFA.   Current Outpatient Prescriptions  Medication Sig Dispense Refill  . acetaminophen (TYLENOL) 325 MG tablet Take 2 tablets (650 mg total) by mouth every 4 (four) hours as needed for headache or mild pain.    Marland Kitchen aspirin EC 81 MG tablet Take 81 mg by mouth daily.    . cetirizine (ZYRTEC) 10 MG tablet Take 10 mg by mouth daily as needed for allergies.    . cholecalciferol (VITAMIN D) 1000 UNITS tablet Take 1,000 Units by mouth daily.    . clopidogrel (PLAVIX) 75 MG tablet Take 1 tablet (75 mg total) by mouth daily with breakfast. 90 tablet 11  . isosorbide mononitrate (IMDUR) 30 MG 24 hr tablet Take 1 tablet (30 mg total) by mouth daily. PLEASE SCHEDULE FOLLOW-UP APPOINTMENT FOR MORE REFILLS. 30 tablet 0  . metoprolol succinate (TOPROL XL) 25 MG 24 hr tablet Take 1 tablet (25 mg total) by mouth daily. 30 tablet 6  . nitroGLYCERIN (NITROSTAT) 0.4 MG SL tablet Place 1 tablet (0.4 mg total) under the tongue every 5 (five) minutes as needed for chest pain. 25 tablet 1  . Polyethyl Glycol-Propyl Glycol (SYSTANE) 0.4-0.3 % SOLN Place 1 drop into both eyes 4 (four) times daily as needed (dry eyes).    Marland Kitchen  pravastatin (PRAVACHOL) 40 MG tablet Take 1 tablet (40 mg total) by mouth every evening. 90 tablet 3  . PRESCRIPTION MEDICATION Apply 1 application topically at bedtime as needed (for back and neck pain). Medicated cream from [place]     No current facility-administered medications for this visit.    No Known Allergies  Social History   Social History  . Marital Status: Married    Spouse Name: N/A  . Number of Children: N/A  . Years of Education: N/A    Occupational History  . Not on file.   Social History Main Topics  . Smoking status: Former Smoker -- 0.10 packs/day for 20 years    Types: Cigarettes    Quit date: 06/14/1979  . Smokeless tobacco: Never Used  . Alcohol Use: Yes     Comment: 01/04/2015 "might have a glass of wine q couple years"  . Drug Use: No  . Sexual Activity: Not on file   Other Topics Concern  . Not on file   Social History Narrative     Review of Systems: General: negative for chills, fever, night sweats or weight changes.  Cardiovascular: negative for chest pain, dyspnea on exertion, edema, orthopnea, palpitations, paroxysmal nocturnal dyspnea or shortness of breath Dermatological: negative for rash Respiratory: negative for cough or wheezing Urologic: negative for hematuria Abdominal: negative for nausea, vomiting, diarrhea, bright red blood per rectum, melena, or hematemesis Neurologic: negative for visual changes, syncope, or dizziness All other systems reviewed and are otherwise negative except as noted above.    Blood pressure 146/88, pulse 85, height 5\' 10"  (1.778 m), weight 136 lb (61.689 kg).  General appearance: alert and no distress Neck: no adenopathy, no carotid bruit, no JVD, supple, symmetrical, trachea midline and thyroid not enlarged, symmetric, no tenderness/mass/nodules Lungs: clear to auscultation bilaterally Heart: regular rate and rhythm, S1, S2 normal, no murmur, click, rub or gallop Extremities: extremities normal, atraumatic, no cyanosis or edema  EKG normal sinus rhythm 58 with old anterior wall myocardial infarction. I personally reviewed this EKG.  ASSESSMENT AND PLAN:   HTN (hypertension) History of hypertension with blood pressure measured at 146/88. He is on metoprolol. Continue current meds at current dosing  Hyperlipemia History of hyperlipidemia on pravastatin with recent lipid profile performed 12/29/14 revealing total cholesterol 188, LDL 109 and an HDL of  52.  CAD, CABG X 3 9/09. Cath 2012 and 02/05/13- 2/3 grafts occluded- medical Rx History of coronary artery disease status post coronary artery bypass grafting September 2009 with 3 grafts placed. I performed his last catheterization because of chest pain similar to his pre-bypass symptoms 01/04/15 revealing a patent LIMA to the LAD, 60% mid AV groove circumflex which was dominant. He had an occluded vein to diagonal and obtuse marginal branch. He continues to get occasional atypical chest pain.  PVD - Rt SFA PTA 8/112 History of peripheral arterial disease status post right SFA stenting August 2012. He had repeat angiography 01/04/2015 revealing a short segment occlusion mid right SFA was 60% "in-stent restenosis within the distal right SFA stent. I performed rotational atherectomy and drug-eluting agent plasty of his short segment occlusion with an excellent angiographic result. His claudication resolved. Recent Dopplers however performed 08/17/15 showed a decline in his right ABI from 1-0.88 with a new high-frequency signal in his distal right SFA. He is currently asymptomatic.      Lorretta Harp MD FACP,FACC,FAHA, [facility] 10/01/2015 3:37 PM

## 2015-10-01 NOTE — Assessment & Plan Note (Signed)
History of coronary artery disease status post coronary artery bypass grafting September 2009 with 3 grafts placed. I performed his last catheterization because of chest pain similar to his pre-bypass symptoms 01/04/15 revealing a patent LIMA to the LAD, 60% mid AV groove circumflex which was dominant. He had an occluded vein to diagonal and obtuse marginal branch. He continues to get occasional atypical chest pain.

## 2015-10-07 NOTE — Addendum Note (Signed)
Addended by: Diana Eves on: 10/07/2015 02:50 PM   Modules accepted: Orders

## 2015-10-13 ENCOUNTER — Telehealth: Payer: Self-pay | Admitting: Cardiovascular Disease

## 2015-10-13 NOTE — Telephone Encounter (Signed)
Call from NP at [facility] with questions as she was unable to see a lot of things need in pt chart via care everywhere. Pt admitted to [facility] as he was at the [place] with epigastric pain and they were seeing T-wave changes. Pt has Lexi with EF of 40 % and cardiac enzymes have been negative. Reviewed with NP pt previous EF from Lexi and Echo. She verbalized understanding, no additional questions at this time.

## 2016-03-16 ENCOUNTER — Other Ambulatory Visit: Payer: Self-pay | Admitting: Cardiovascular Disease

## 2016-03-17 NOTE — Telephone Encounter (Signed)
Rx(s) sent to pharmacy electronically.  

## 2016-04-04 ENCOUNTER — Encounter: Payer: Self-pay | Admitting: Cardiovascular Disease

## 2016-04-04 ENCOUNTER — Other Ambulatory Visit: Payer: Self-pay | Admitting: *Deleted

## 2016-04-04 ENCOUNTER — Ambulatory Visit
Admission: RE | Admit: 2016-04-04 | Discharge: 2016-04-04 | Disposition: A | Discharge status: Home / Self Care | Payer: Medicare Other | Source: Ambulatory Visit | Attending: Cardiovascular Disease | Admitting: Cardiovascular Disease

## 2016-04-04 ENCOUNTER — Ambulatory Visit (INDEPENDENT_AMBULATORY_CARE_PROVIDER_SITE_OTHER): Payer: Medicare Other | Admitting: Cardiovascular Disease

## 2016-04-04 VITALS — BP 110/72 | HR 107 | Ht 70.0 in | Wt 131.0 lb

## 2016-04-04 DIAGNOSIS — I739 Peripheral vascular disease, unspecified: Secondary | ICD-10-CM

## 2016-04-04 DIAGNOSIS — Z01818 Encounter for other preprocedural examination: Secondary | ICD-10-CM

## 2016-04-04 DIAGNOSIS — I10 Essential (primary) hypertension: Secondary | ICD-10-CM

## 2016-04-04 DIAGNOSIS — D689 Coagulation defect, unspecified: Secondary | ICD-10-CM

## 2016-04-04 DIAGNOSIS — I2510 Atherosclerotic heart disease of native coronary artery without angina pectoris: Secondary | ICD-10-CM

## 2016-04-04 DIAGNOSIS — E785 Hyperlipidemia, unspecified: Secondary | ICD-10-CM

## 2016-04-04 DIAGNOSIS — I2583 Coronary atherosclerosis due to lipid rich plaque: Secondary | ICD-10-CM

## 2016-04-04 NOTE — Assessment & Plan Note (Signed)
History of peripheral vascular disease status post remote right SFA stenting with recent intervention 01/04/15 of a short segment. Right SFA occlusion. His claudication improved at that time however he's had recurrent symptoms and recent Dopplers have shown a decline in his right ankle brachial index from 1.88 with a new high-frequency signal in his mid to distal right SFA. This may represent "in-stent restenosis or a tan oval lesion. The patient was to proceed with angiography and potential intervention.

## 2016-04-04 NOTE — Patient Instructions (Signed)
Medication Instructions:  Your physician recommends that you continue on your current medications as directed. Please refer to the Current Medication list given to you today.   Testing/Procedures: Dr. [name] has ordered a peripheral angiogram to be done at [facility].  This procedure is going to look at the bloodflow in your lower extremities.  If Dr. [name] is able to open up the arteries, you will have to spend one night in the hospital.  If he is not able to open the arteries, you will be able to go home that same day.    After the procedure, you will not be allowed to drive for 3 days or push, pull, or lift anything greater than 10 lbs for one week.    You will be required to have the following tests prior to the procedure:  1. Blood work-the blood work can be done no more than 14 days prior to the procedure.  It can be done at any [facility] lab.  There is one downstairs on the first floor of this building and one in the [facility] building [phone] [address], [address])  2. Chest Xray-the chest xray order has already been placed at the [place].     *REPS  SCOTT  Puncture site LEFT GROIN   If you need a refill on your cardiac medications before your next appointment, please call your pharmacy.

## 2016-04-04 NOTE — Progress Notes (Signed)
04/04/2016 MERLON VOZZA   July 09, 1936  [id number]  Primary Physician Pcp Not In System Primary Cardiologist: Lorretta Harp MD [name]  HPI:  The patient is a 80 year old AAM, followed by Dr. [name]. I last saw him in the office 10/01/15.He has known CAD, s/p CABG x 3 in 2009. He received a LIMA to LAD, a vein graft to the diagonal branch and a vein graft to the ramus branch. A repeat cardiac cath in 2012 revealed a patent LIMA graft, but both vein grafts were found to be occluded. He also has peripheral vascular disease, s/p PTA and stenting of his right SFA in 2012, as well as hypertension and hyperlipidemia. He presented to [facility] on 6/23 with a complaint of intermittent substernal chest pain, over the last several days, that was worse yesterday evening. The pain is similar to his angina before is CABG. The pain has become more frequent and wakes him from his sleep. It radiates to the left jaw back and bilateral scapula. The pain is non-exertional. Last PM, the pain was 10/10. It was responsive to SL NTG. He denies any other associated symptoms. He ruled out for myocardial infarction with negative enzymes. He underwent Myoview stress testing and developed chest pain and ST segment changes during Lexiscan infusion. Because of this I performed a catheterization on him 02/05/13 revealing essentially unchanged anatomy. He does complain of right calf claudication and recurrent chest pain. A recent Myoview was low-risk rule out semi-Dopplers performed 12/03/14 revealed a right ABI 0.44 with an occluded distal right ft SFA and popliteal artery. I performed cardiac catheterization on him on 01/04/15 revealing unchanged anatomy from his prior cath with a preliminary LAD. I also performed peripheral angiography revealing a short segment occlusion of the distal right superficial femoral artery proximal to a previously placed stent. I was able to cross this and performed directional atherectomy with  PTA using drug-eluting stent with excellent angiographic result. His claudication has improved. Since I saw him back in June 2016 he's done well. He has occasional atypical chest pain. He has had no recurrent claudication although his recent Dopplers did show a decline in his right ABI from 1-0.88 with a new high-frequency signal in the distal right SFA.. Since I saw him 6 months ago he continues to complain of right calf claudication which is lifestyle limiting. He gets occasional chest pain as well.   Current Outpatient Prescriptions  Medication Sig Dispense Refill  . acetaminophen (TYLENOL) 325 MG tablet Take 2 tablets (650 mg total) by mouth every 4 (four) hours as needed for headache or mild pain.    Marland Kitchen aspirin EC 81 MG tablet Take 81 mg by mouth daily.    . cetirizine (ZYRTEC) 10 MG tablet Take 10 mg by mouth daily as needed for allergies.    . cholecalciferol (VITAMIN D) 1000 UNITS tablet Take 1,000 Units by mouth daily.    . clopidogrel (PLAVIX) 75 MG tablet TAKE 1 TABLET (75 MG TOTAL) BY MOUTH DAILY WITH BREAKFAST. 90 tablet 3  . isosorbide mononitrate (IMDUR) 30 MG 24 hr tablet Take 1 tablet (30 mg total) by mouth daily. PLEASE SCHEDULE FOLLOW-UP APPOINTMENT FOR MORE REFILLS. 30 tablet 0  . metoprolol succinate (TOPROL XL) 25 MG 24 hr tablet Take 1 tablet (25 mg total) by mouth daily. 30 tablet 6  . nitroGLYCERIN (NITROSTAT) 0.4 MG SL tablet Place 1 tablet (0.4 mg total) under the tongue every 5 (five) minutes as needed for chest  pain. 25 tablet 1  . Polyethyl Glycol-Propyl Glycol (SYSTANE) 0.4-0.3 % SOLN Place 1 drop into both eyes 4 (four) times daily as needed (dry eyes).    . pravastatin (PRAVACHOL) 40 MG tablet Take 1 tablet (40 mg total) by mouth every evening. 90 tablet 3  . PRESCRIPTION MEDICATION Apply 1 application topically at bedtime as needed (for back and neck pain). Medicated cream from [place]     No current facility-administered medications for this visit.     No Known  Allergies  Social History   Social History  . Marital status: Married    Spouse name: N/A  . Number of children: N/A  . Years of education: N/A   Occupational History  . Not on file.   Social History Main Topics  . Smoking status: Former Smoker    Packs/day: 0.10    Years: 20.00    Types: Cigarettes    Quit date: 06/14/1979  . Smokeless tobacco: Never Used  . Alcohol use Yes     Comment: 01/04/2015 "might have a glass of wine q couple years"  . Drug use: No  . Sexual activity: Not on file   Other Topics Concern  . Not on file   Social History Narrative  . No narrative on file     Review of Systems: General: negative for chills, fever, night sweats or weight changes.  Cardiovascular: negative for chest pain, dyspnea on exertion, edema, orthopnea, palpitations, paroxysmal nocturnal dyspnea or shortness of breath Dermatological: negative for rash Respiratory: negative for cough or wheezing Urologic: negative for hematuria Abdominal: negative for nausea, vomiting, diarrhea, bright red blood per rectum, melena, or hematemesis Neurologic: negative for visual changes, syncope, or dizziness All other systems reviewed and are otherwise negative except as noted above.    Blood pressure 110/72, pulse (!) 107, height 5\' 10"  (1.778 m), weight 131 lb (59.4 kg).  General appearance: alert and no distress Neck: no adenopathy, no carotid bruit, no JVD, supple, symmetrical, trachea midline and thyroid not enlarged, symmetric, no tenderness/mass/nodules Lungs: clear to auscultation bilaterally Heart: regular rate and rhythm, S1, S2 normal, no murmur, click, rub or gallop Extremities: extremities normal, atraumatic, no cyanosis or edema  EKG sinus tachycardia 107 with nonspecific ST and T-wave changes. I personally reviewed this EKG  ASSESSMENT AND PLAN:   HTN (hypertension) History of hypertension blood pressure measurements A1 10/72. He is on Toprol. Continue current meds at  current dosing  Hyperlipemia History of hyperlipidemia on statin therapy followed by his PCP  CAD, CABG X 3 9/09. Cath 2012 and 02/05/13- 2/3 grafts occluded- medical Rx History of coronary artery disease status post bypass grafting X 3 in 2009 the LIMA to the LAD, vein to diagonal branch and ramus branch. Multiple catheterizations since that time most recently 01/04/15 revealing unchanged anatomy. He does have known occluded vein grafts. He gets occasional chest pain and is on a long-acting nitrate.  PVD - Rt SFA PTA 8/112 History of peripheral vascular disease status post remote right SFA stenting with recent intervention 01/04/15 of a short segment. Right SFA occlusion. His claudication improved at that time however he's had recurrent symptoms and recent Dopplers have shown a decline in his right ankle brachial index from 1.88 with a new high-frequency signal in his mid to distal right SFA. This may represent "in-stent restenosis or a tan oval lesion. The patient was to proceed with angiography and potential intervention.      Lorretta Harp MD FACP,FACC,FAHA, [facility] 04/04/2016 2:54  PM

## 2016-04-04 NOTE — Assessment & Plan Note (Signed)
History of coronary artery disease status post bypass grafting X 3 in 2009 the LIMA to the LAD, vein to diagonal branch and ramus branch. Multiple catheterizations since that time most recently 01/04/15 revealing unchanged anatomy. He does have known occluded vein grafts. He gets occasional chest pain and is on a long-acting nitrate.

## 2016-04-04 NOTE — Assessment & Plan Note (Signed)
History of hypertension blood pressure measurements A1 10/72. He is on Toprol. Continue current meds at current dosing

## 2016-04-04 NOTE — Assessment & Plan Note (Signed)
History of hyperlipidemia on statin therapy followed by his PCP 

## 2016-04-10 ENCOUNTER — Other Ambulatory Visit: Payer: Self-pay | Admitting: Cardiovascular Disease

## 2016-04-10 DIAGNOSIS — I10 Essential (primary) hypertension: Secondary | ICD-10-CM

## 2016-04-10 DIAGNOSIS — I739 Peripheral vascular disease, unspecified: Secondary | ICD-10-CM

## 2016-04-11 NOTE — Telephone Encounter (Signed)
Rx request sent to pharmacy.  

## 2016-04-27 LAB — APTT: aPTT: 28 s (ref 22–34)

## 2016-04-27 LAB — CBC WITH DIFFERENTIAL/PLATELET
BASOS PCT: 0 %
Basophils Absolute: 0 cells/uL (ref 0–200)
Eosinophils Absolute: 81 cells/uL (ref 15–500)
Eosinophils Relative: 1 %
HEMATOCRIT: 38.7 % (ref 38.5–50.0)
HEMOGLOBIN: 13 g/dL — AB (ref 13.2–17.1)
LYMPHS ABS: 1296 {cells}/uL (ref 850–3900)
Lymphocytes Relative: 16 %
MCH: 31.3 pg (ref 27.0–33.0)
MCHC: 33.6 g/dL (ref 32.0–36.0)
MCV: 93.3 fL (ref 80.0–100.0)
MONO ABS: 729 {cells}/uL (ref 200–950)
MPV: 8.5 fL (ref 7.5–12.5)
Monocytes Relative: 9 %
NEUTROS ABS: 5994 {cells}/uL (ref 1500–7800)
Neutrophils Relative %: 74 %
Platelets: 259 10*3/uL (ref 140–400)
RBC: 4.15 MIL/uL — AB (ref 4.20–5.80)
RDW: 14.7 % (ref 11.0–15.0)
WBC: 8.1 10*3/uL (ref 3.8–10.8)

## 2016-04-27 LAB — PROTIME-INR
INR: 1
Prothrombin Time: 10.7 s (ref 9.0–11.5)

## 2016-04-28 LAB — BASIC METABOLIC PANEL
BUN: 18 mg/dL (ref 7–25)
CHLORIDE: 107 mmol/L (ref 98–110)
CO2: 25 mmol/L (ref 20–31)
Calcium: 8.6 mg/dL (ref 8.6–10.3)
Creat: 1.21 mg/dL — ABNORMAL HIGH (ref 0.70–1.18)
GLUCOSE: 97 mg/dL (ref 65–99)
POTASSIUM: 4.8 mmol/L (ref 3.5–5.3)
Sodium: 143 mmol/L (ref 135–146)

## 2016-04-28 LAB — TSH: TSH: 2.92 mIU/L (ref 0.40–4.50)

## 2016-05-11 ENCOUNTER — Encounter (HOSPITAL_COMMUNITY): Payer: Self-pay | Admitting: *Deleted

## 2016-05-11 ENCOUNTER — Encounter (HOSPITAL_COMMUNITY)
Admission: RE | Disposition: A | Discharge status: Home / Self Care | Payer: Self-pay | Source: Ambulatory Visit | Attending: Cardiovascular Disease

## 2016-05-11 ENCOUNTER — Ambulatory Visit (HOSPITAL_COMMUNITY)
Admission: RE | Admit: 2016-05-11 | Discharge: 2016-05-12 | Disposition: A | Discharge status: Home / Self Care | Payer: Medicare Other | Source: Ambulatory Visit | Attending: Cardiovascular Disease | Admitting: Cardiovascular Disease

## 2016-05-11 DIAGNOSIS — R0789 Other chest pain: Secondary | ICD-10-CM | POA: Diagnosis not present

## 2016-05-11 DIAGNOSIS — Y838 Other surgical procedures as the cause of abnormal reaction of the patient, or of later complication, without mention of misadventure at the time of the procedure: Secondary | ICD-10-CM | POA: Insufficient documentation

## 2016-05-11 DIAGNOSIS — E785 Hyperlipidemia, unspecified: Secondary | ICD-10-CM | POA: Diagnosis not present

## 2016-05-11 DIAGNOSIS — I739 Peripheral vascular disease, unspecified: Secondary | ICD-10-CM | POA: Diagnosis present

## 2016-05-11 DIAGNOSIS — Z7902 Long term (current) use of antithrombotics/antiplatelets: Secondary | ICD-10-CM | POA: Diagnosis not present

## 2016-05-11 DIAGNOSIS — Z951 Presence of aortocoronary bypass graft: Secondary | ICD-10-CM | POA: Insufficient documentation

## 2016-05-11 DIAGNOSIS — Z87891 Personal history of nicotine dependence: Secondary | ICD-10-CM | POA: Insufficient documentation

## 2016-05-11 DIAGNOSIS — Z7982 Long term (current) use of aspirin: Secondary | ICD-10-CM | POA: Insufficient documentation

## 2016-05-11 DIAGNOSIS — I2510 Atherosclerotic heart disease of native coronary artery without angina pectoris: Secondary | ICD-10-CM | POA: Insufficient documentation

## 2016-05-11 DIAGNOSIS — I10 Essential (primary) hypertension: Secondary | ICD-10-CM | POA: Insufficient documentation

## 2016-05-11 DIAGNOSIS — Z95820 Peripheral vascular angioplasty status with implants and grafts: Secondary | ICD-10-CM | POA: Insufficient documentation

## 2016-05-11 DIAGNOSIS — I70211 Atherosclerosis of native arteries of extremities with intermittent claudication, right leg: Secondary | ICD-10-CM | POA: Insufficient documentation

## 2016-05-11 DIAGNOSIS — I97638 Postprocedural hematoma of a circulatory system organ or structure following other circulatory system procedure: Secondary | ICD-10-CM | POA: Diagnosis not present

## 2016-05-11 HISTORY — PX: PERIPHERAL VASCULAR CATHETERIZATION: SHX172C

## 2016-05-11 LAB — POCT ACTIVATED CLOTTING TIME
ACTIVATED CLOTTING TIME: 208 s
Activated Clotting Time: 191 seconds
Activated Clotting Time: 263 seconds
Activated Clotting Time: 180 seconds
Activated Clotting Time: 191 seconds

## 2016-05-11 SURGERY — LOWER EXTREMITY ANGIOGRAPHY
Anesthesia: LOCAL | Laterality: Right

## 2016-05-11 MED ORDER — LIDOCAINE HCL (PF) 1 % IJ SOLN
INTRAMUSCULAR | Status: DC | PRN
  Administered 2016-05-11: 22 mL

## 2016-05-11 MED ORDER — FENTANYL CITRATE (PF) 100 MCG/2ML IJ SOLN
INTRAMUSCULAR | Status: DC | PRN
  Administered 2016-05-11 (×3): 25 ug via INTRAVENOUS

## 2016-05-11 MED ORDER — ASPIRIN EC 81 MG PO TBEC
81.0000 mg | DELAYED_RELEASE_TABLET | Freq: Every day | ORAL | Status: DC
  Filled 2016-05-11: qty 1

## 2016-05-11 MED ORDER — INFLUENZA VAC SPLIT QUAD 0.5 ML IM SUSY
0.5000 mL | PREFILLED_SYRINGE | INTRAMUSCULAR | Status: AC
  Administered 2016-05-12: 10:00:00 0.5 mL via INTRAMUSCULAR
  Filled 2016-05-11: qty 0.5

## 2016-05-11 MED ORDER — SODIUM CHLORIDE 0.9% FLUSH: 3.0000 mL | INTRAVENOUS | Status: DC | PRN

## 2016-05-11 MED ORDER — HEPARIN (PORCINE) IN NACL 2-0.9 UNIT/ML-% IJ SOLN
INTRAMUSCULAR | Status: AC
  Filled 2016-05-11: qty 1000

## 2016-05-11 MED ORDER — HEPARIN (PORCINE) IN NACL 2-0.9 UNIT/ML-% IJ SOLN
INTRAMUSCULAR | Status: DC | PRN
  Administered 2016-05-11: 1000 mL

## 2016-05-11 MED ORDER — MIDAZOLAM HCL 2 MG/2ML IJ SOLN
INTRAMUSCULAR | Status: AC
  Filled 2016-05-11: qty 2

## 2016-05-11 MED ORDER — MORPHINE SULFATE (PF) 2 MG/ML IV SOLN: 2.0000 mg | INTRAVENOUS | Status: DC | PRN

## 2016-05-11 MED ORDER — HEPARIN SODIUM (PORCINE) 1000 UNIT/ML IJ SOLN
INTRAMUSCULAR | Status: AC
  Filled 2016-05-11: qty 1

## 2016-05-11 MED ORDER — FENTANYL CITRATE (PF) 100 MCG/2ML IJ SOLN
INTRAMUSCULAR | Status: AC
  Filled 2016-05-11: qty 2

## 2016-05-11 MED ORDER — NITROGLYCERIN 1 MG/10 ML FOR IR/CATH LAB
INTRA_ARTERIAL | Status: DC | PRN
  Administered 2016-05-11 (×2): 200 ug via INTRA_ARTERIAL

## 2016-05-11 MED ORDER — ISOSORBIDE MONONITRATE ER 30 MG PO TB24
30.0000 mg | ORAL_TABLET | Freq: Every day | ORAL | Status: DC
  Filled 2016-05-11: qty 1

## 2016-05-11 MED ORDER — IODIXANOL 320 MG/ML IV SOLN
INTRAVENOUS | Status: DC | PRN
  Administered 2016-05-11: 115 mL via INTRA_ARTERIAL

## 2016-05-11 MED ORDER — ACETAMINOPHEN 325 MG PO TABS: 650.0000 mg | ORAL_TABLET | ORAL | Status: DC | PRN

## 2016-05-11 MED ORDER — POLYVINYL ALCOHOL 1.4 % OP SOLN
1.0000 [drp] | Freq: Four times a day (QID) | OPHTHALMIC | Status: DC | PRN
  Filled 2016-05-11: qty 15

## 2016-05-11 MED ORDER — HYDRALAZINE HCL 20 MG/ML IJ SOLN: 10.0000 mg | INTRAMUSCULAR | Status: DC | PRN

## 2016-05-11 MED ORDER — VITAMIN D 1000 UNITS PO TABS
1000.0000 [IU] | ORAL_TABLET | Freq: Every day | ORAL | Status: DC
  Filled 2016-05-11: qty 1

## 2016-05-11 MED ORDER — NITROGLYCERIN 0.4 MG SL SUBL: 0.4000 mg | SUBLINGUAL_TABLET | SUBLINGUAL | Status: DC | PRN

## 2016-05-11 MED ORDER — METOPROLOL SUCCINATE ER 25 MG PO TB24
25.0000 mg | ORAL_TABLET | Freq: Every day | ORAL | Status: DC
  Filled 2016-05-11 (×2): qty 1

## 2016-05-11 MED ORDER — MIDAZOLAM HCL 2 MG/2ML IJ SOLN
INTRAMUSCULAR | Status: DC | PRN
  Administered 2016-05-11: 1 mg via INTRAVENOUS

## 2016-05-11 MED ORDER — SODIUM CHLORIDE 0.9 % WEIGHT BASED INFUSION
3.0000 mL/kg/h | INTRAVENOUS | Status: DC
  Administered 2016-05-11: 3 mL/kg/h via INTRAVENOUS

## 2016-05-11 MED ORDER — ASPIRIN EC 81 MG PO TBEC: 81.0000 mg | DELAYED_RELEASE_TABLET | Freq: Every day | ORAL | Status: DC

## 2016-05-11 MED ORDER — LIDOCAINE HCL (PF) 1 % IJ SOLN
INTRAMUSCULAR | Status: AC
  Filled 2016-05-11: qty 30

## 2016-05-11 MED ORDER — SODIUM CHLORIDE 0.9 % IV SOLN
INTRAVENOUS | Status: AC
  Administered 2016-05-11: 19:00:00 via INTRAVENOUS

## 2016-05-11 MED ORDER — SODIUM CHLORIDE 0.9 % WEIGHT BASED INFUSION: 1.0000 mL/kg/h | INTRAVENOUS | Status: DC

## 2016-05-11 MED ORDER — HEPARIN SODIUM (PORCINE) 1000 UNIT/ML IJ SOLN
INTRAMUSCULAR | Status: DC | PRN
  Administered 2016-05-11: 2500 [IU] via INTRAVENOUS
  Administered 2016-05-11: 6000 [IU] via INTRAVENOUS
  Administered 2016-05-11: 3000 [IU] via INTRAVENOUS

## 2016-05-11 MED ORDER — POLYETHYL GLYCOL-PROPYL GLYCOL 0.4-0.3 % OP SOLN: 1.0000 [drp] | Freq: Four times a day (QID) | OPHTHALMIC | Status: DC | PRN

## 2016-05-11 MED ORDER — CLOPIDOGREL BISULFATE 75 MG PO TABS
75.0000 mg | ORAL_TABLET | Freq: Every day | ORAL | Status: DC
Start: 2016-05-12 — End: 2016-05-12
  Filled 2016-05-11: qty 1

## 2016-05-11 MED ORDER — ONDANSETRON HCL 4 MG/2ML IJ SOLN: 4.0000 mg | Freq: Four times a day (QID) | INTRAMUSCULAR | Status: DC | PRN

## 2016-05-11 MED ORDER — CLOPIDOGREL BISULFATE 75 MG PO TABS: 75.0000 mg | ORAL_TABLET | Freq: Every day | ORAL | Status: DC

## 2016-05-11 MED ORDER — ASPIRIN 81 MG PO CHEW: 81.0000 mg | CHEWABLE_TABLET | ORAL | Status: DC

## 2016-05-11 MED ORDER — PRAVASTATIN SODIUM 40 MG PO TABS
40.0000 mg | ORAL_TABLET | Freq: Every evening | ORAL | Status: DC
  Administered 2016-05-11: 40 mg via ORAL
  Filled 2016-05-11: qty 1

## 2016-05-11 SURGICAL SUPPLY — 23 items
BALLN IN.PACT DCB 5X40 (BALLOONS) ×3
BALLN IN.PACT DCB 5X80 (BALLOONS) ×3
CATH CROSS OVER TEMPO 5F (CATHETERS) ×3 IMPLANT
CATH HAWKONE LS STANDARD TIP (CATHETERS) ×3
CATH HAWKONE LS STD TIP (CATHETERS) ×2 IMPLANT
CATH STRAIGHT 5FR 65CM (CATHETERS) ×3 IMPLANT
COVER DOME SNAP 22 D (MISCELLANEOUS) ×3 IMPLANT
DCB IN.PACT 5X40 (BALLOONS) ×2 IMPLANT
DCB IN.PACT 5X80 (BALLOONS) ×2 IMPLANT
DEVICE SPIDERFX EMB PROT 6MM (WIRE) ×3 IMPLANT
KIT ENCORE 26 ADVANTAGE (KITS) ×3 IMPLANT
KIT PV (KITS) ×3 IMPLANT
SHEATH PINNACLE 5F 10CM (SHEATH) ×3 IMPLANT
SHEATH PINNACLE 7F 10CM (SHEATH) ×3 IMPLANT
SHEATH PINNACLE ST 7F 65CM (SHEATH) ×3 IMPLANT
STOPCOCK MORSE 400PSI 3WAY (MISCELLANEOUS) ×3 IMPLANT
SYRINGE MEDRAD AVANTA MACH 7 (SYRINGE) ×3 IMPLANT
TRANSDUCER W/STOPCOCK (MISCELLANEOUS) ×3 IMPLANT
TRAY PV CATH (CUSTOM PROCEDURE TRAY) ×3 IMPLANT
TUBING CIL FLEX 10 FLL-RA (TUBING) ×3 IMPLANT
WIRE HITORQ VERSACORE ST 145CM (WIRE) ×3 IMPLANT
WIRE ROSEN-J .035X180CM (WIRE) ×3 IMPLANT
WIRE SPARTACORE .014X300CM (WIRE) ×3 IMPLANT

## 2016-05-11 NOTE — H&P (Signed)
05/11/16 CRAY PREW   04/03/1936  [id number]  Primary Physician Pcp Not In System Primary Cardiologist: Lorretta Harp MD [name]  HPI:  The patient is a 80 year old AAM, followed by Dr. [name]. I last saw him in the office 10/01/15.He has known CAD, s/p CABG x 3 in 2009. He received a LIMA to LAD, a vein graft to the diagonal branch and a vein graft to the ramus branch. A repeat cardiac cath in 2012 revealed a patent LIMA graft, but both vein grafts were found to be occluded. He also has peripheral vascular disease, s/p PTA and stenting of his right SFA in 2012, as well as hypertension and hyperlipidemia. He presented to [facility] on 6/23 with a complaint of intermittent substernal chest pain, over the last several days, that was worse yesterday evening. The pain is similar to his angina before is CABG. The pain has become more frequent and wakes him from his sleep. It radiates to the left jaw back and bilateral scapula. The pain is non-exertional. Last PM, the pain was 10/10. It was responsive to SL NTG. He denies any other associated symptoms. He ruled out for myocardial infarction with negative enzymes. He underwent Myoview stress testing and developed chest pain and ST segment changes during Lexiscan infusion. Because of this I performed a catheterization on him 02/05/13 revealing essentially unchanged anatomy. He does complain of right calf claudication and recurrent chest pain. A recent Myoview was low-risk rule out semi-Dopplers performed 12/03/14 revealed a right ABI 0.44 with an occluded distal right ft SFA and popliteal artery. I performed cardiac catheterization on him on 01/04/15 revealing unchanged anatomy from his prior cath with a preliminary LAD. I also performed peripheral angiography revealing a short segment occlusion of the distal right superficial femoral artery proximal to a previously placed stent. I was able to cross this and performed directional atherectomy  with PTA using drug-eluting stent with excellent angiographic result. His claudication has improved. Since I saw him back in June 2016 he's done well. He has occasional atypical chest pain. He has had no recurrent claudication although his recent Dopplers did show a decline in his right ABI from 1-0.88 with a new high-frequency signal in the distal right SFA.. Since I saw him 6 months ago he continues to complain of right calf claudication which is lifestyle limiting. He gets occasional chest pain as well.         Current Outpatient Prescriptions  Medication Sig Dispense Refill  . acetaminophen (TYLENOL) 325 MG tablet Take 2 tablets (650 mg total) by mouth every 4 (four) hours as needed for headache or mild pain.    Marland Kitchen aspirin EC 81 MG tablet Take 81 mg by mouth daily.    . cetirizine (ZYRTEC) 10 MG tablet Take 10 mg by mouth daily as needed for allergies.    . cholecalciferol (VITAMIN D) 1000 UNITS tablet Take 1,000 Units by mouth daily.    . clopidogrel (PLAVIX) 75 MG tablet TAKE 1 TABLET (75 MG TOTAL) BY MOUTH DAILY WITH BREAKFAST. 90 tablet 3  . isosorbide mononitrate (IMDUR) 30 MG 24 hr tablet Take 1 tablet (30 mg total) by mouth daily. PLEASE SCHEDULE FOLLOW-UP APPOINTMENT FOR MORE REFILLS. 30 tablet 0  . metoprolol succinate (TOPROL XL) 25 MG 24 hr tablet Take 1 tablet (25 mg total) by mouth daily. 30 tablet 6  . nitroGLYCERIN (NITROSTAT) 0.4 MG SL tablet Place 1 tablet (0.4 mg total) under the tongue every  5 (five) minutes as needed for chest pain. 25 tablet 1  . Polyethyl Glycol-Propyl Glycol (SYSTANE) 0.4-0.3 % SOLN Place 1 drop into both eyes 4 (four) times daily as needed (dry eyes).    . pravastatin (PRAVACHOL) 40 MG tablet Take 1 tablet (40 mg total) by mouth every evening. 90 tablet 3  . PRESCRIPTION MEDICATION Apply 1 application topically at bedtime as needed (for back and neck pain). Medicated cream from [place]     No current facility-administered medications for this  visit.     No Known Allergies  Social History        Social History  . Marital status: Married    Spouse name: N/A  . Number of children: N/A  . Years of education: N/A      Occupational History  . Not on file.         Social History Main Topics  . Smoking status: Former Smoker    Packs/day: 0.10    Years: 20.00    Types: Cigarettes    Quit date: 06/14/1979  . Smokeless tobacco: Never Used  . Alcohol use Yes     Comment: 01/04/2015 "might have a glass of wine q couple years"  . Drug use: No  . Sexual activity: Not on file       Other Topics Concern  . Not on file      Social History Narrative  . No narrative on file     Review of Systems: General: negative for chills, fever, night sweats or weight changes.  Cardiovascular: negative for chest pain, dyspnea on exertion, edema, orthopnea, palpitations, paroxysmal nocturnal dyspnea or shortness of breath Dermatological: negative for rash Respiratory: negative for cough or wheezing Urologic: negative for hematuria Abdominal: negative for nausea, vomiting, diarrhea, bright red blood per rectum, melena, or hematemesis Neurologic: negative for visual changes, syncope, or dizziness All other systems reviewed and are otherwise negative except as noted above.    Blood pressure 110/72, pulse (!) 107, height 5\' 10"  (1.778 m), weight 131 lb (59.4 kg).  General appearance: alert and no distress Neck: no adenopathy, no carotid bruit, no JVD, supple, symmetrical, trachea midline and thyroid not enlarged, symmetric, no tenderness/mass/nodules Lungs: clear to auscultation bilaterally Heart: regular rate and rhythm, S1, S2 normal, no murmur, click, rub or gallop Extremities: extremities normal, atraumatic, no cyanosis or edema  EKG sinus tachycardia 107 with nonspecific ST and T-wave changes. I personally reviewed this EKG  ASSESSMENT AND PLAN:   HTN (hypertension) History of hypertension blood  pressure measurements A1 10/72. He is on Toprol. Continue current meds at current dosing  Hyperlipemia History of hyperlipidemia on statin therapy followed by his PCP  CAD, CABG X 3 9/09. Cath 2012 and 02/05/13- 2/3 grafts occluded- medical Rx History of coronary artery disease status post bypass grafting X 3 in 2009 the LIMA to the LAD, vein to diagonal branch and ramus branch. Multiple catheterizations since that time most recently 01/04/15 revealing unchanged anatomy. He does have known occluded vein grafts. He gets occasional chest pain and is on a long-acting nitrate.  PVD - Rt SFA PTA 8/112 History of peripheral vascular disease status post remote right SFA stenting with recent intervention 01/04/15 of a short segment. Right SFA occlusion. His claudication improved at that time however he's had recurrent symptoms and recent Dopplers have shown a decline in his right ankle brachial index from 1.88 with a new high-frequency signal in his mid to distal right SFA. This may represent "in-stent restenosis  or a tan oval lesion. The patient was to proceed with angiography and potential intervention.   Lorretta Harp, M.D., [place], [facility], [place] [address] [place],   [address]  [phone] 05/11/2016 7:05 AM

## 2016-05-11 NOTE — Care Management Note (Signed)
Case Management Note  Patient Details  Name: Tony Curtis MRN: [id number] Date of Birth: [birth year]  Subjective/Objective:      S/p peripheral vascular intervention already plavix, NCM will cont to follow for dc needs.               Action/Plan:   Expected Discharge Date:                  Expected Discharge Plan:  Home/Self Care  In-House Referral:     Discharge planning Services  CM Consult  Post Acute Care Choice:    Choice offered to:     DME Arranged:    DME Agency:     HH Arranged:    HH Agency:     Status of Service:  In process, will continue to follow  If discussed at Long Length of Stay Meetings, dates discussed:    Additional Comments:  Zenon Mayo, RN 05/11/2016, 4:29 PM

## 2016-05-11 NOTE — Interval H&P Note (Signed)
History and Physical Interval Note:  05/11/2016 7:30 AM  Tony Curtis  has presented today for surgery, with the diagnosis of Claudication  The various methods of treatment have been discussed with the patient and family. After consideration of risks, benefits and other options for treatment, the patient has consented to  Procedure(s): Lower Extremity Angiography (N/A) as a surgical intervention .  The patient's history has been reviewed, patient examined, no change in status, stable for surgery.  I have reviewed the patient's chart and labs.  Questions were answered to the patient's satisfaction.     [name]

## 2016-05-11 NOTE — Progress Notes (Signed)
Site area: left groin fa sheath Site Prior to Removal:  Level  1 small hematoma above site Pressure Applied For:  35 minutes Manual:   yes Patient Status During Pull:  stable Post Pull Site:  Level  0. Hematoma resolved Post Pull Instructions Given:  yes Post Pull Pulses Present: yes Dressing Applied:  tegaderm Bedrest begins @ [id number] Comments:

## 2016-05-12 ENCOUNTER — Other Ambulatory Visit: Payer: Self-pay | Admitting: Cardiology

## 2016-05-12 ENCOUNTER — Other Ambulatory Visit: Payer: Self-pay | Admitting: Cardiovascular Disease

## 2016-05-12 DIAGNOSIS — D649 Anemia, unspecified: Secondary | ICD-10-CM

## 2016-05-12 DIAGNOSIS — I739 Peripheral vascular disease, unspecified: Secondary | ICD-10-CM

## 2016-05-12 DIAGNOSIS — I70211 Atherosclerosis of native arteries of extremities with intermittent claudication, right leg: Secondary | ICD-10-CM | POA: Diagnosis not present

## 2016-05-12 LAB — CBC
HEMATOCRIT: 28.9 % — AB (ref 39.0–52.0)
HEMOGLOBIN: 9 g/dL — AB (ref 13.0–17.0)
MCH: 30 pg (ref 26.0–34.0)
MCHC: 31.1 g/dL (ref 30.0–36.0)
MCV: 96.3 fL (ref 78.0–100.0)
Platelets: 225 10*3/uL (ref 150–400)
RBC: 3 MIL/uL — ABNORMAL LOW (ref 4.22–5.81)
RDW: 14.8 % (ref 11.5–15.5)
WBC: 6.9 10*3/uL (ref 4.0–10.5)

## 2016-05-12 LAB — BASIC METABOLIC PANEL
ANION GAP: 3 — AB (ref 5–15)
BUN: 18 mg/dL (ref 6–20)
CHLORIDE: 111 mmol/L (ref 101–111)
CO2: 24 mmol/L (ref 22–32)
Calcium: 8 mg/dL — ABNORMAL LOW (ref 8.9–10.3)
Creatinine, Ser: 1.08 mg/dL (ref 0.61–1.24)
GFR calc non Af Amer: >60 mL/min (ref >60)
GLUCOSE: 85 mg/dL (ref 65–99)
POTASSIUM: 4 mmol/L (ref 3.5–5.1)
Sodium: 138 mmol/L (ref 135–145)

## 2016-05-12 MED ORDER — ANGIOPLASTY BOOK
Freq: Once | Status: DC
  Filled 2016-05-12: qty 1

## 2016-05-12 NOTE — Discharge Summary (Signed)
Discharge Summary    Patient ID: Tony Curtis,  MRN: [id number], DOB/AGE: [birth year] 80 y.o.  Admit date: 05/11/2016 Discharge date: 05/12/2016  Primary Care Provider: Pcp Not In System Primary Cardiologist: Dr. [name]  Discharge Diagnoses    Active Problems:   PVD - Rt SFA PTA 8/112   Claudication ([place])   Allergies No Known Allergies  Diagnostic Studies/Procedures  PV Angiogram/Intervention   Procedures Performed:            1. Contralateral access (third order catheter placement)            2. Right lower angiography with runoff            3. Placement of spider distal protection device in the right popliteal artery            4. Hawk one directional atherectomy mid and distal right SFA, drug-eluting balloon                 angioplasty mid and distal right SFA            5. Captured distal protection device, completion angiography  Final Impression: Successful Hawk one directional arthrectomy and drug-eluting balloon angioplasty of the mid and distal right SFA using spider distal protection. The patient was already on dual antibiotic therapy. The long sheath was withdrawn across the bifurcation and exchanged over wire for a short 7 French sheath which was secured in place. The patient left the lab in stable condition. The sheath will be removed once the AST falls below 170 and pressure held. He will be hydrated overnight, discharged home in the morning. We will get lower extremity arterial Doppler studies in the [facility] line office next week and I will see him back in 2-3 weeks for follow-up. _____________   History of Present Illness   The patient is a 80 year old AAM, followed by Dr. [name]. He has known CAD, s/p CABG x 3 in 2009. He received a LIMA to LAD, a vein graft to the diagonal branch and a vein graft to the ramus branch.   A repeat cardiac cath in 2012 revealed a patent LIMA graft, but both vein grafts were found to be occluded. He also has peripheral vascular  disease, s/p PTA and stenting of his right SFA in 2012, as well as hypertension and hyperlipidemia.   He had cardiac catheterization on 01/04/15 revealing unchanged anatomy from his prior cath with a preliminary LAD. Also performed peripheral angiography revealing a short segment occlusion of the distal right superficial femoral artery proximal to a previously placed stent. A directional atherectomy with PTA using drug-eluting stent was placed with excellent angiographic result.   He was seen by Dr. [name] on 03/15/16 and continued to complain of right calf claudication which is lifestyle limiting. He gets occasional chest pain as well. Doppler suggested a high-frequency signal in the distal right SFA with a decline in his right ABI. He was referred for angiography and potential re-intervention for lifestyle limiting claudication on 05/11/16.   Hospital Course  Full PV angiography report above. He underwent successful Hawk one directional arthrectomy and drug-eluting balloon angioplasty of the mid and distal right SFA using spider distal protection.  He tolerated the procedure well and his right femoral site was stable at discharge.   He will remain on ASA and Plavix. No changes to his medications were made. It was noted that his hemoglobin was 9.0 at discharge. His hemoglobin on 04/27/16 was 13.He denies melena or  hematochezia. We will get a follow up CBC at his appointment for dopplers on 05/18/16.   He was seen today by Dr. Ellyn Hack and deemed suitable for discharge.  _____________  Discharge Vitals Blood pressure 116/73, pulse 89, temperature 97.2 F (36.2 C), temperature source Axillary, resp. rate (!) 33, height 5\' 10"  (1.778 m), weight 126 lb 1.7 oz (57.2 kg), SpO2 98 %.  Filed Weights   05/11/16 0536 05/12/16 0348  Weight: 130 lb (59 kg) 126 lb 1.7 oz (57.2 kg)   Physical Exam  General appearance: alert and no distress Neck: no adenopathy, no carotid bruit, no JVD, supple, symmetrical,  trachea midline and thyroid not enlarged, symmetric, no tenderness/mass/nodules Lungs: clear to auscultation bilaterally Heart: regular rate and rhythm, S1, S2 normal, no murmur, click, rub or gallop Extremities: extremities normal, atraumatic, no cyanosis or edema   Labs & Radiologic Studies     CBC  Recent Labs  05/12/16 0404  WBC 6.9  HGB 9.0*  HCT 28.9*  MCV 96.3  PLT 123456   Basic Metabolic Panel  Recent Labs  05/12/16 0404  NA 138  K 4.0  CL 111  CO2 24  GLUCOSE 85  BUN 18  CREATININE 1.08  CALCIUM 8.0*     Disposition   Pt is being discharged home today in good condition.  Follow-up Plans & Appointments    Follow-up Information    [facility] Follow up on 05/18/2016.   Specialty:  Cardiology Why:  at 10:00 am for doppler studies Contact information: [address] [place] [phone]       [name], MD Follow up on 05/31/2016.   Specialties:  Cardiology, Radiology Why:  at 10:45 am for follow up.  Contact information: [address] [place] [address] [phone]          Discharge Instructions    Diet - low sodium heart healthy    Complete by:  As directed    Discharge instructions    Complete by:  As directed    Groin Site Care Refer to this sheet in the next few weeks. These instructions provide you with information on caring for yourself after your procedure. Your caregiver may also give you more specific instructions. Your treatment has been planned according to current medical practices, but problems sometimes occur. Call your caregiver if you have any problems or questions after your procedure. HOME CARE INSTRUCTIONS You may shower 24 hours after the procedure. Remove the bandage (dressing) and gently wash the site with plain soap and water. Gently pat the site dry.  Do not apply powder or lotion to the site.  Do not sit in a bathtub, swimming pool, or  whirlpool for 5 to 7 days.  No bending, squatting, or lifting anything over 10 pounds (4.5 kg) as directed by your caregiver.  Inspect the site at least twice daily.  Do not drive home if you are discharged the same day of the procedure. Have someone else drive you.  You may drive 24 hours after the procedure unless otherwise instructed by your caregiver.  What to expect: Any bruising will usually fade within 1 to 2 weeks.  Blood that collects in the tissue (hematoma) may be painful to the touch. It should usually decrease in size and tenderness within 1 to 2 weeks.  SEEK IMMEDIATE MEDICAL CARE IF: You have unusual pain at the groin site or down the affected leg.  You have redness, warmth, swelling,  or pain at the groin site.  You have drainage (other than a small amount of blood on the dressing).  You have chills.  You have a fever or persistent symptoms for more than 72 hours.  You have a fever and your symptoms suddenly get worse.  Your leg becomes pale, cool, tingly, or numb.  You have heavy bleeding from the site. Hold pressure on the site. .   Increase activity slowly    Complete by:  As directed       Discharge Medications   Current Discharge Medication List    CONTINUE these medications which have NOT CHANGED   Details  aspirin EC 81 MG tablet Take 81 mg by mouth daily.    cetirizine (ZYRTEC) 10 MG tablet Take 10 mg by mouth daily as needed for allergies.    cholecalciferol (VITAMIN D) 1000 UNITS tablet Take 1,000 Units by mouth daily.    clopidogrel (PLAVIX) 75 MG tablet TAKE 1 TABLET (75 MG TOTAL) BY MOUTH DAILY WITH BREAKFAST. Qty: 90 tablet, Refills: 3    isosorbide mononitrate (IMDUR) 30 MG 24 hr tablet Take 1 tablet (30 mg total) by mouth daily. Qty: 30 tablet, Refills: 11   Associated Diagnoses: Essential hypertension; Claudication (HCC)    metoprolol succinate (TOPROL XL) 25 MG 24 hr tablet Take 1 tablet (25 mg total) by mouth daily. Qty: 30 tablet, Refills:  6   Associated Diagnoses: Essential hypertension; Claudication (HCC)    Polyethyl Glycol-Propyl Glycol (SYSTANE) 0.4-0.3 % SOLN Place 1 drop into both eyes 4 (four) times daily as needed (dry eyes).    pravastatin (PRAVACHOL) 40 MG tablet Take 1 tablet (40 mg total) by mouth every evening. Qty: 90 tablet, Refills: 3    PRESCRIPTION MEDICATION Apply 1 application topically at bedtime as needed (for back and neck pain). Medicated cream from [place]    acetaminophen (TYLENOL) 325 MG tablet Take 2 tablets (650 mg total) by mouth every 4 (four) hours as needed for headache or mild pain.    nitroGLYCERIN (NITROSTAT) 0.4 MG SL tablet Place 1 tablet (0.4 mg total) under the tongue every 5 (five) minutes as needed for chest pain. Qty: 25 tablet, Refills: 1   Associated Diagnoses: Chest pain, unspecified chest pain type           Outstanding Labs/Studies   CBC  Duration of Discharge Encounter   Greater than 30 minutes including physician time.  Signed, Arbutus Leas NP 05/12/2016, 9:35 AM

## 2016-05-12 NOTE — Care Management Note (Signed)
Case Management Note  Patient Details  Name: XABIER ROSENMAN MRN: [id number] Date of Birth: [birth year]  Subjective/Objective:  S/p peripheral vascular intervention already on plavix, for dc no needs.                  Action/Plan:   Expected Discharge Date:                  Expected Discharge Plan:  Home/Self Care  In-House Referral:     Discharge planning Services  CM Consult  Post Acute Care Choice:    Choice offered to:     DME Arranged:    DME Agency:     HH Arranged:    HH Agency:     Status of Service:  Completed, signed off  If discussed at [employer] of Stay Meetings, dates discussed:    Additional Comments:  Zenon Mayo, RN 05/12/2016, 10:41 AM

## 2016-05-18 ENCOUNTER — Ambulatory Visit (HOSPITAL_COMMUNITY)
Admission: RE | Admit: 2016-05-18 | Discharge: 2016-05-18 | Disposition: A | Discharge status: Home / Self Care | Payer: Medicare Other | Source: Ambulatory Visit | Attending: Internal Medicine | Admitting: Internal Medicine

## 2016-05-18 DIAGNOSIS — I10 Essential (primary) hypertension: Secondary | ICD-10-CM

## 2016-05-18 DIAGNOSIS — Z95828 Presence of other vascular implants and grafts: Secondary | ICD-10-CM | POA: Diagnosis not present

## 2016-05-18 DIAGNOSIS — I739 Peripheral vascular disease, unspecified: Secondary | ICD-10-CM | POA: Diagnosis not present

## 2016-05-19 ENCOUNTER — Telehealth: Payer: Self-pay | Admitting: *Deleted

## 2016-05-19 DIAGNOSIS — I739 Peripheral vascular disease, unspecified: Secondary | ICD-10-CM

## 2016-05-19 NOTE — Telephone Encounter (Signed)
-----   Message from Lorretta Harp, MD sent at 05/18/2016  2:06 PM EDT ----- No evidence of significant peripheral vascular occlusive disease. Repeat in 6 months

## 2016-05-24 ENCOUNTER — Telehealth: Payer: Self-pay | Admitting: Cardiovascular Disease

## 2016-05-24 NOTE — Telephone Encounter (Signed)
SPOKE TO WIFE , PATIENT IS IN THE BACK GROUND WIFE STATES CHEST PAIN WHEN EATING BEFORE AND AFTER EATING. HAS CHEST PAIN WHEN SWALLOWING AS WELL. WIFE STATES HE HAS CHEST PAIN OFF AND ON. HAS NOT USED NTG.  RN INFORMED WIFE THE SYMPTOMS - ARE NOT TYPICAL HEART RELATED- ASK IF PATIENT HAS PRIMARY. WIFE STATES PATIENT GOES TO [facility] OFFERED AN APPOINTMENT. SHE AGREED. APPOINTMENT SCHEDULE FOR 05/25/16.

## 2016-05-24 NOTE — Telephone Encounter (Signed)
New Message  Pt c/o of Chest Pain: STAT if CP now or developed within 24 hours  1. Are you having CP right now? When pt is eating or tries to eat and starting spitting up.  2. Are you experiencing any other symptoms (ex. SOB, nausea, vomiting, sweating)? Pure clear spit comes up also when he's talking, SOB, Sweating  3. How long have you been experiencing CP? Past two days  4. Is your CP continuous or coming and going? Coming and going.  5. Have you taken Nitroglycerin? No, has not taken any.  Pt voiced has legs done two weeks ago  Pt voiced he hurting around his heart and stomach ?

## 2016-05-25 ENCOUNTER — Ambulatory Visit: Payer: Medicare Other | Admitting: Nurse Practitioner

## 2016-05-31 ENCOUNTER — Ambulatory Visit (INDEPENDENT_AMBULATORY_CARE_PROVIDER_SITE_OTHER): Payer: Medicare Other | Admitting: Cardiovascular Disease

## 2016-05-31 ENCOUNTER — Encounter: Payer: Self-pay | Admitting: Cardiovascular Disease

## 2016-05-31 VITALS — BP 100/62 | HR 96 | Ht 70.0 in | Wt 115.8 lb

## 2016-05-31 DIAGNOSIS — I739 Peripheral vascular disease, unspecified: Secondary | ICD-10-CM | POA: Diagnosis not present

## 2016-05-31 DIAGNOSIS — I10 Essential (primary) hypertension: Secondary | ICD-10-CM | POA: Diagnosis not present

## 2016-05-31 NOTE — Assessment & Plan Note (Signed)
History of hypertension blood pressure 100/62. He is on metoprolol. Continue current meds at current dosing

## 2016-05-31 NOTE — Progress Notes (Signed)
05/31/2016 Tony Curtis   05-31-1936  [id number]  Primary Physician Pcp Not In System Primary Cardiologist: Lorretta Harp MD [name]  HPI:  The patient is a 80 year old AAM, followed by Dr. [name]. I last saw him in the office 04/04/16.He has known CAD, s/p CABG x 3 in 2009. He received a LIMA to LAD, a vein graft to the diagonal branch and a vein graft to the ramus branch. A repeat cardiac cath in 2012 revealed a patent LIMA graft, but both vein grafts were found to be occluded. He also has peripheral vascular disease, s/p PTA and stenting of his right SFA in 2012, as well as hypertension and hyperlipidemia. He presented to [facility] on 6/23 with a complaint of intermittent substernal chest pain, over the last several days, that was worse yesterday evening. The pain is similar to his angina before is CABG. The pain has become more frequent and wakes him from his sleep. It radiates to the left jaw back and bilateral scapula. The pain is non-exertional. Last PM, the pain was 10/10. It was responsive to SL NTG. He denies any other associated symptoms. He ruled out for myocardial infarction with negative enzymes. He underwent Myoview stress testing and developed chest pain and ST segment changes during Lexiscan infusion. Because of this I performed a catheterization on him 02/05/13 revealing essentially unchanged anatomy. He does complain of right calf claudication and recurrent chest pain. A recent Myoview was low-risk rule out semi-Dopplers performed 12/03/14 revealed a right ABI 0.44 with an occluded distal right ft SFA and popliteal artery. I performed cardiac catheterization on him on 01/04/15 revealing unchanged anatomy from his prior cath with a preliminary LAD. I also performed peripheral angiography revealing a short segment occlusion of the distal right superficial femoral artery proximal to a previously placed stent. I was able to cross this and performed directional atherectomy with  PTA using drug-eluting stent with excellent angiographic result. His claudication has improved. Since I saw him back in June 2016 he's done well. He has occasional atypical chest pain. He has had no recurrent claudication although his recent Dopplers did show a decline in his right ABI from 1-0.88 with a new high-frequency signal in the distal right SFA.. Since I saw him 6 months ago he continues to complain of right calf claudication which is lifestyle limiting. He gets occasional chest pain as well. Doppler suggested a high-frequency signal in the distal right SFA with a decline in his right ABI. He underwent angiography and intervention by myself 05/11/16. I performed one directional arthrectomy and drug-eluting balloon angioplasty of a mid right SFA lesion just above the previously placed stent which had 30-40% "in-stent restenosis. He had an excellent angiographic result reducing a 95% stenosis to less than 10% residual. His lower extremity until Dopplers revealed an increase in his right ABI from 0.88 up to 1.3 with resolution of the high-frequency signal. His claudication has resolved.   Current Outpatient Prescriptions  Medication Sig Dispense Refill  . acetaminophen (TYLENOL) 325 MG tablet Take 2 tablets (650 mg total) by mouth every 4 (four) hours as needed for headache or mild pain.    Marland Kitchen aspirin EC 81 MG tablet Take 81 mg by mouth daily.    . cetirizine (ZYRTEC) 10 MG tablet Take 10 mg by mouth daily as needed for allergies.    . cholecalciferol (VITAMIN D) 1000 UNITS tablet Take 1,000 Units by mouth daily.    . clopidogrel (PLAVIX) 75  MG tablet TAKE 1 TABLET (75 MG TOTAL) BY MOUTH DAILY WITH BREAKFAST. 90 tablet 3  . isosorbide mononitrate (IMDUR) 30 MG 24 hr tablet Take 1 tablet (30 mg total) by mouth daily. 30 tablet 11  . metoprolol succinate (TOPROL XL) 25 MG 24 hr tablet Take 1 tablet (25 mg total) by mouth daily. 30 tablet 6  . nitroGLYCERIN (NITROSTAT) 0.4 MG SL tablet Place 1 tablet  (0.4 mg total) under the tongue every 5 (five) minutes as needed for chest pain. 25 tablet 1  . Polyethyl Glycol-Propyl Glycol (SYSTANE) 0.4-0.3 % SOLN Place 1 drop into both eyes 4 (four) times daily as needed (dry eyes).    . pravastatin (PRAVACHOL) 40 MG tablet Take 1 tablet (40 mg total) by mouth every evening. 90 tablet 3  . PRESCRIPTION MEDICATION Apply 1 application topically at bedtime as needed (for back and neck pain). Medicated cream from [place]     No current facility-administered medications for this visit.     No Known Allergies  Social History   Social History  . Marital status: Married    Spouse name: N/A  . Number of children: N/A  . Years of education: N/A   Occupational History  . Not on file.   Social History Main Topics  . Smoking status: Former Smoker    Packs/day: 0.10    Years: 20.00    Types: Cigarettes    Quit date: 06/14/1979  . Smokeless tobacco: Never Used  . Alcohol use Yes     Comment: 01/04/2015 "might have a glass of wine q couple years"  . Drug use: No  . Sexual activity: Not on file   Other Topics Concern  . Not on file   Social History Narrative  . No narrative on file     Review of Systems: General: negative for chills, fever, night sweats or weight changes.  Cardiovascular: negative for chest pain, dyspnea on exertion, edema, orthopnea, palpitations, paroxysmal nocturnal dyspnea or shortness of breath Dermatological: negative for rash Respiratory: negative for cough or wheezing Urologic: negative for hematuria Abdominal: negative for nausea, vomiting, diarrhea, bright red blood per rectum, melena, or hematemesis Neurologic: negative for visual changes, syncope, or dizziness All other systems reviewed and are otherwise negative except as noted above.    Blood pressure 100/62, pulse 96, height 5\' 10"  (1.778 m), weight 115 lb 12.8 oz (52.5 kg).  General appearance: alert and no distress Neck: no adenopathy, no carotid bruit, no JVD,  supple, symmetrical, trachea midline and thyroid not enlarged, symmetric, no tenderness/mass/nodules Lungs: clear to auscultation bilaterally Heart: regular rate and rhythm, S1, S2 normal, no murmur, click, rub or gallop Extremities: extremities normal, atraumatic, no cyanosis or edema  EKG not performed today  ASSESSMENT AND PLAN:   HTN (hypertension) History of hypertension blood pressure 100/62. He is on metoprolol. Continue current meds at current dosing  Hyperlipemia History of hyperlipidemia on statin therapy followed by his PCP  CAD, CABG X 3 9/09. Cath 2012 and 02/05/13- 2/3 grafts occluded- medical Rx History of coronary artery disease status post bypass grafting X 3 in 2009 the LIMA to the LAD, vein to diagonal branch and to the ramus branch. He had catheterization performed 01/04/15 showed unchanged anatomy. He does get esophageal/reflux type pain but no ischemic chest pain.  PVD - Rt SFA PTA 8/112 History of peripheral arterial disease status post PTA and stenting of his right SFA by myself in 2012. He's had multiple interventions since most recently 05/11/16. He  underwent one directional atherectomy of the mid right SFA lesion along with drug-eluting balloon angioplasty. His claudication has resolved. His right ABI has improved from 0.88-1.3 and a high-frequency signal in his SFA has resolved as well. We'll continue to follow him invasively. He remains on aspirin and Plavix.      Lorretta Harp MD FACP,FACC,FAHA, [facility] 05/31/2016 10:02 AM

## 2016-05-31 NOTE — Patient Instructions (Signed)
Medication Instructions:  Your physician recommends that you continue on your current medications as directed. Please refer to the Current Medication list given to you today.  Labwork: NONE ORDERED  Testing/Procedures: 1. Lower Extremity Arterial Dopplers in 6 months - Your physician has requested that you have a lower or upper extremity arterial duplex. This test is an ultrasound of the arteries in the legs or arms. It looks at arterial blood flow in the legs and arms. Allow one hour for Lower and Upper Arterial scans. There are no restrictions or special instructions.  Follow-Up: Your physician wants you to follow-up in 1 year with Dr [name]. You will receive a reminder letter in the mail two months in advance. If you don't receive a letter, please call our office to schedule the follow-up appointment.  Any Other Special Instructions Will Be Listed Below (If Applicable).     If you need a refill on your cardiac medications before your next appointment, please call your pharmacy.

## 2016-05-31 NOTE — Assessment & Plan Note (Signed)
History of peripheral arterial disease status post PTA and stenting of his right SFA by myself in 2012. He's had multiple interventions since most recently 05/11/16. He underwent one directional atherectomy of the mid right SFA lesion along with drug-eluting balloon angioplasty. His claudication has resolved. His right ABI has improved from 0.88-1.3 and a high-frequency signal in his SFA has resolved as well. We'll continue to follow him invasively. He remains on aspirin and Plavix.

## 2016-05-31 NOTE — Assessment & Plan Note (Signed)
History of hyperlipidemia on statin therapy followed by his PCP 

## 2016-05-31 NOTE — Assessment & Plan Note (Signed)
History of coronary artery disease status post bypass grafting X 3 in 2009 the LIMA to the LAD, vein to diagonal branch and to the ramus branch. He had catheterization performed 01/04/15 showed unchanged anatomy. He does get esophageal/reflux type pain but no ischemic chest pain.

## 2016-06-07 ENCOUNTER — Encounter (HOSPITAL_COMMUNITY): Payer: Self-pay

## 2016-06-07 ENCOUNTER — Inpatient Hospital Stay (HOSPITAL_COMMUNITY)
Admission: EM | Admit: 2016-06-07 | Discharge: 2016-06-14 | DRG: 871 | Disposition: A | Discharge status: Hospice | Payer: Medicare Other | Attending: Family Medicine | Admitting: Family Medicine

## 2016-06-07 ENCOUNTER — Emergency Department (HOSPITAL_COMMUNITY): Payer: Medicare Other

## 2016-06-07 DIAGNOSIS — I739 Peripheral vascular disease, unspecified: Secondary | ICD-10-CM | POA: Diagnosis present

## 2016-06-07 DIAGNOSIS — I25708 Atherosclerosis of coronary artery bypass graft(s), unspecified, with other forms of angina pectoris: Secondary | ICD-10-CM

## 2016-06-07 DIAGNOSIS — E876 Hypokalemia: Secondary | ICD-10-CM | POA: Diagnosis present

## 2016-06-07 DIAGNOSIS — R1319 Other dysphagia: Secondary | ICD-10-CM

## 2016-06-07 DIAGNOSIS — C169 Malignant neoplasm of stomach, unspecified: Secondary | ICD-10-CM

## 2016-06-07 DIAGNOSIS — K922 Gastrointestinal hemorrhage, unspecified: Secondary | ICD-10-CM | POA: Diagnosis present

## 2016-06-07 DIAGNOSIS — J690 Pneumonitis due to inhalation of food and vomit: Secondary | ICD-10-CM | POA: Diagnosis present

## 2016-06-07 DIAGNOSIS — R627 Adult failure to thrive: Secondary | ICD-10-CM | POA: Diagnosis present

## 2016-06-07 DIAGNOSIS — K5900 Constipation, unspecified: Secondary | ICD-10-CM | POA: Diagnosis present

## 2016-06-07 DIAGNOSIS — E860 Dehydration: Secondary | ICD-10-CM | POA: Diagnosis not present

## 2016-06-07 DIAGNOSIS — Z7982 Long term (current) use of aspirin: Secondary | ICD-10-CM

## 2016-06-07 DIAGNOSIS — E785 Hyperlipidemia, unspecified: Secondary | ICD-10-CM | POA: Diagnosis present

## 2016-06-07 DIAGNOSIS — K0889 Other specified disorders of teeth and supporting structures: Secondary | ICD-10-CM | POA: Diagnosis present

## 2016-06-07 DIAGNOSIS — C801 Malignant (primary) neoplasm, unspecified: Secondary | ICD-10-CM

## 2016-06-07 DIAGNOSIS — C160 Malignant neoplasm of cardia: Secondary | ICD-10-CM | POA: Diagnosis present

## 2016-06-07 DIAGNOSIS — Z23 Encounter for immunization: Secondary | ICD-10-CM

## 2016-06-07 DIAGNOSIS — J449 Chronic obstructive pulmonary disease, unspecified: Secondary | ICD-10-CM | POA: Diagnosis present

## 2016-06-07 DIAGNOSIS — R1310 Dysphagia, unspecified: Secondary | ICD-10-CM

## 2016-06-07 DIAGNOSIS — Z809 Family history of malignant neoplasm, unspecified: Secondary | ICD-10-CM

## 2016-06-07 DIAGNOSIS — I2510 Atherosclerotic heart disease of native coronary artery without angina pectoris: Secondary | ICD-10-CM | POA: Diagnosis present

## 2016-06-07 DIAGNOSIS — Z955 Presence of coronary angioplasty implant and graft: Secondary | ICD-10-CM

## 2016-06-07 DIAGNOSIS — G8929 Other chronic pain: Secondary | ICD-10-CM | POA: Diagnosis present

## 2016-06-07 DIAGNOSIS — R634 Abnormal weight loss: Secondary | ICD-10-CM | POA: Diagnosis present

## 2016-06-07 DIAGNOSIS — D649 Anemia, unspecified: Secondary | ICD-10-CM | POA: Insufficient documentation

## 2016-06-07 DIAGNOSIS — N179 Acute kidney failure, unspecified: Secondary | ICD-10-CM | POA: Diagnosis present

## 2016-06-07 DIAGNOSIS — R066 Hiccough: Secondary | ICD-10-CM | POA: Diagnosis not present

## 2016-06-07 DIAGNOSIS — R1312 Dysphagia, oropharyngeal phase: Secondary | ICD-10-CM | POA: Diagnosis present

## 2016-06-07 DIAGNOSIS — A419 Sepsis, unspecified organism: Secondary | ICD-10-CM | POA: Diagnosis not present

## 2016-06-07 DIAGNOSIS — Z87891 Personal history of nicotine dependence: Secondary | ICD-10-CM

## 2016-06-07 DIAGNOSIS — M1990 Unspecified osteoarthritis, unspecified site: Secondary | ICD-10-CM | POA: Diagnosis present

## 2016-06-07 DIAGNOSIS — I252 Old myocardial infarction: Secondary | ICD-10-CM

## 2016-06-07 DIAGNOSIS — Z8249 Family history of ischemic heart disease and other diseases of the circulatory system: Secondary | ICD-10-CM

## 2016-06-07 DIAGNOSIS — I10 Essential (primary) hypertension: Secondary | ICD-10-CM | POA: Diagnosis present

## 2016-06-07 DIAGNOSIS — K567 Ileus, unspecified: Secondary | ICD-10-CM | POA: Diagnosis present

## 2016-06-07 DIAGNOSIS — K921 Melena: Secondary | ICD-10-CM | POA: Diagnosis present

## 2016-06-07 DIAGNOSIS — Z951 Presence of aortocoronary bypass graft: Secondary | ICD-10-CM

## 2016-06-07 DIAGNOSIS — Z681 Body mass index (BMI) 19 or less, adult: Secondary | ICD-10-CM

## 2016-06-07 DIAGNOSIS — Z7902 Long term (current) use of antithrombotics/antiplatelets: Secondary | ICD-10-CM

## 2016-06-07 DIAGNOSIS — C787 Secondary malignant neoplasm of liver and intrahepatic bile duct: Secondary | ICD-10-CM | POA: Diagnosis present

## 2016-06-07 DIAGNOSIS — Z8546 Personal history of malignant neoplasm of prostate: Secondary | ICD-10-CM

## 2016-06-07 DIAGNOSIS — R1314 Dysphagia, pharyngoesophageal phase: Secondary | ICD-10-CM | POA: Diagnosis present

## 2016-06-07 DIAGNOSIS — R109 Unspecified abdominal pain: Secondary | ICD-10-CM

## 2016-06-07 DIAGNOSIS — Z515 Encounter for palliative care: Secondary | ICD-10-CM | POA: Diagnosis present

## 2016-06-07 DIAGNOSIS — R0902 Hypoxemia: Secondary | ICD-10-CM | POA: Diagnosis present

## 2016-06-07 DIAGNOSIS — I70209 Unspecified atherosclerosis of native arteries of extremities, unspecified extremity: Secondary | ICD-10-CM | POA: Diagnosis present

## 2016-06-07 DIAGNOSIS — R000 Tachycardia, unspecified: Secondary | ICD-10-CM | POA: Diagnosis present

## 2016-06-07 DIAGNOSIS — K219 Gastro-esophageal reflux disease without esophagitis: Secondary | ICD-10-CM | POA: Diagnosis present

## 2016-06-07 DIAGNOSIS — D62 Acute posthemorrhagic anemia: Secondary | ICD-10-CM | POA: Diagnosis present

## 2016-06-07 DIAGNOSIS — R319 Hematuria, unspecified: Secondary | ICD-10-CM | POA: Diagnosis present

## 2016-06-07 DIAGNOSIS — M549 Dorsalgia, unspecified: Secondary | ICD-10-CM | POA: Diagnosis present

## 2016-06-07 DIAGNOSIS — Z7401 Bed confinement status: Secondary | ICD-10-CM

## 2016-06-07 DIAGNOSIS — Z95820 Peripheral vascular angioplasty status with implants and grafts: Secondary | ICD-10-CM

## 2016-06-07 HISTORY — DX: Neoplasm of unspecified behavior of digestive system: D49.0

## 2016-06-07 LAB — URINALYSIS, ROUTINE W REFLEX MICROSCOPIC
Glucose, UA: NEGATIVE mg/dL
Ketones, ur: 15 mg/dL — AB
Leukocytes, UA: NEGATIVE
NITRITE: NEGATIVE
PH: 6 (ref 5.0–8.0)
Protein, ur: 30 mg/dL — AB
SPECIFIC GRAVITY, URINE: 1.03 (ref 1.005–1.030)

## 2016-06-07 LAB — CBC
HEMATOCRIT: 24.5 % — AB (ref 39.0–52.0)
Hemoglobin: 7.5 g/dL — ABNORMAL LOW (ref 13.0–17.0)
MCH: 27 pg (ref 26.0–34.0)
MCHC: 30.6 g/dL (ref 30.0–36.0)
MCV: 88.1 fL (ref 78.0–100.0)
Platelets: 409 10*3/uL — ABNORMAL HIGH (ref 150–400)
RBC: 2.78 MIL/uL — AB (ref 4.22–5.81)
RDW: 15.7 % — ABNORMAL HIGH (ref 11.5–15.5)
WBC: 14.4 10*3/uL — AB (ref 4.0–10.5)

## 2016-06-07 LAB — BASIC METABOLIC PANEL
ANION GAP: 14 (ref 5–15)
BUN: 22 mg/dL — ABNORMAL HIGH (ref 6–20)
CHLORIDE: 107 mmol/L (ref 101–111)
CO2: 21 mmol/L — ABNORMAL LOW (ref 22–32)
Calcium: 9.2 mg/dL (ref 8.9–10.3)
Creatinine, Ser: 1.2 mg/dL (ref 0.61–1.24)
GFR, EST NON AFRICAN AMERICAN: 56 mL/min — AB (ref >60)
Glucose, Bld: 135 mg/dL — ABNORMAL HIGH (ref 65–99)
POTASSIUM: 4.3 mmol/L (ref 3.5–5.1)
SODIUM: 142 mmol/L (ref 135–145)

## 2016-06-07 LAB — PROTIME-INR
INR: 1.15
PROTHROMBIN TIME: 14.8 s (ref 11.4–15.2)

## 2016-06-07 LAB — URINE MICROSCOPIC-ADD ON

## 2016-06-07 LAB — OCCULT BLOOD X 1 CARD TO LAB, STOOL: Fecal Occult Bld: POSITIVE — AB

## 2016-06-07 LAB — PREPARE RBC (CROSSMATCH)

## 2016-06-07 LAB — I-STAT TROPONIN, ED: Troponin i, poc: 0 ng/mL (ref 0.00–0.08)

## 2016-06-07 MED ORDER — SODIUM CHLORIDE 0.9 % IV SOLN
8.0000 mg/h | INTRAVENOUS | Status: AC
Start: 2016-06-07 — End: 2016-06-10
  Administered 2016-06-07 – 2016-06-10 (×5): 8 mg/h via INTRAVENOUS
  Filled 2016-06-07 (×13): qty 80

## 2016-06-07 MED ORDER — SODIUM CHLORIDE 0.9 % IV SOLN
INTRAVENOUS | Status: DC
  Administered 2016-06-07 – 2016-06-08 (×3): via INTRAVENOUS

## 2016-06-07 MED ORDER — SODIUM CHLORIDE 0.9 % IV SOLN
Freq: Once | INTRAVENOUS | Status: AC
  Administered 2016-06-07: 14:00:00 via INTRAVENOUS

## 2016-06-07 MED ORDER — SODIUM CHLORIDE 0.9 % IV BOLUS (SEPSIS)
1000.0000 mL | Freq: Once | INTRAVENOUS | Status: AC
  Administered 2016-06-07: 1000 mL via INTRAVENOUS

## 2016-06-07 MED ORDER — SODIUM CHLORIDE 0.9 % IV SOLN
80.0000 mg | Freq: Once | INTRAVENOUS | Status: AC
  Administered 2016-06-07: 80 mg via INTRAVENOUS
  Filled 2016-06-07: qty 80

## 2016-06-07 MED ORDER — PNEUMOCOCCAL VAC POLYVALENT 25 MCG/0.5ML IJ INJ
0.5000 mL | INJECTION | INTRAMUSCULAR | Status: AC
  Administered 2016-06-12: 0.5 mL via INTRAMUSCULAR
  Filled 2016-06-07: qty 0.5

## 2016-06-07 MED ORDER — PANTOPRAZOLE SODIUM 40 MG IV SOLR
40.0000 mg | Freq: Two times a day (BID) | INTRAVENOUS | Status: DC
  Administered 2016-06-11 – 2016-06-13 (×5): 40 mg via INTRAVENOUS
  Filled 2016-06-07 (×7): qty 40

## 2016-06-07 MED ORDER — SODIUM CHLORIDE 0.9% FLUSH
3.0000 mL | Freq: Two times a day (BID) | INTRAVENOUS | Status: DC
  Administered 2016-06-07 – 2016-06-10 (×4): 3 mL via INTRAVENOUS
  Administered 2016-06-11: 10 mL via INTRAVENOUS
  Administered 2016-06-11 – 2016-06-14 (×5): 3 mL via INTRAVENOUS

## 2016-06-07 NOTE — H&P (Signed)
History and Physical    Tony Curtis [id number] DOB: 27-Sep-1935 DOA: 06/07/2016  PCP: Pcp Not In System  Patient coming from: Home  Chief Complaint: dysphagia  HPI: Tony Curtis is a 80 y.o. male with medical history significant of CAD (s/p CABG x3 '09, multiple caths, most recent revealed 2/3 graft occlusion), PVD (s/p R SFA stent placement in Sept 2017), GERD, HTN, HLD, prostate cancer who presents to [facility] with symptoms of dysphagia of solids and liquids, odynophagia, substernal chest pain, epigastric abdominal pain, generalized weakness, constipation. He was recently in the hospital at the end of September and underwent DES angioplasty of mid and distal right SFA, discharged with aspirin and plavix. Since then, he has had hoarse voice, pain with swallowing both liquids and solids as well as difficulty swallowing food and liquid. He regurgitates food almost every time he eats. Also admits to constipation, no BM for 1.5 weeks. He admits to dizziness with standing, but no fevers, chills, chest pressure, shortness of breath, nausea.   ED Course: Hgb at time of discharge on 9/29 was 9.0 and on evaluation in the ED Hgb is 7.5. He was given IVF and transfused 1u pRBC. FOBT positive.    Review of Systems: As per HPI otherwise 10 point review of systems negative.   Past Medical History:  Diagnosis Date  . Abnormal EKG, resolved with slower HR 06/05/2013  . Arthritis    "about my whole body"  . CAD (coronary artery disease)    CABG 9/09. Cath 2012 and 02/05/13  . Chronic back pain    "mostly between my shoulders" (01/04/2015)  . Daily headache    "mostly at night; think it's because of the shingles I had" (01/04/2015)  . GERD (gastroesophageal reflux disease)   . H/O echocardiogram 07/08/2008   EF 40% by 2D '09. EF 60% cath 6/14  . HTN (hypertension)   . Hyperlipemia   . Myocardial infarction 2009  . Prostate cancer ([place])   . PVD (peripheral vascular disease) ([place])    LE dopp  (10/19/11) patent R SFA stent, R ant tib occluded with reconstitution within the dorsalis pedis, L SFA 70-99%, L ant tib occluded with reconstitution of flow in the dorsalis pedis, bil ABI 1.2; carotid dopp (04-27-08)-normal  . UTI (urinary tract infection) 06/05/2013    Past Surgical History:  Procedure Laterality Date  . ABDOMINAL ANGIOGRAM Left 10/13/08   LSFA PTA  . ABDOMINAL ANGIOGRAM Right 07/28/2008   RSFA  . ANGIOPLASTY / STENTING FEMORAL Right 01/04/2015  . CARDIAC CATHETERIZATION  07/04/2011   patent LIMA to diag branch, occluded ben grafts to diag and ramus branch; EF >60%   . CARDIAC CATHETERIZATION  03/20/2008   occluded prox LAD with great collaterals from the ramus and the circ and RCA to LAD; EF 45-50%, med tx  . CARDIAC CATHETERIZATION  02/05/13   SVGs occluded, no change  . CARDIAC CATHETERIZATION N/A 01/04/2015   Procedure: Left Heart Cath and Cors/Grafts Angiography;  Surgeon: Lorretta Harp, MD;  Location: [place] [facility];  Service: Cardiovascular;  Laterality: N/A;  . CATARACT EXTRACTION W/ INTRAOCULAR LENS  IMPLANT, BILATERAL Bilateral   . CORONARY ARTERY BYPASS GRAFT  05/06/2008   x3, LIMA to LAD, SVG to diad, SVG to ramus inermediate  . FRACTURE SURGERY    . KNEE DISLOCATION SURGERY Right    "they cut it"  . LEFT HEART CATHETERIZATION WITH CORONARY ANGIOGRAM N/A 02/05/2013   Procedure: LEFT HEART CATHETERIZATION WITH CORONARY ANGIOGRAM;  Surgeon: Lorretta Harp, MD;  Location: [facility] CATH LAB;  Service: Cardiovascular;  Laterality: N/A;  . LEFT HEART CATHETERIZATION WITH CORONARY/GRAFT ANGIOGRAM  07/04/2011   Procedure: LEFT HEART CATHETERIZATION WITH Beatrix Fetters;  Surgeon: Lorretta Harp, MD;  Location: [facility] CATH LAB;  Service: Cardiovascular;;  . LEFT HEART CATHETERIZATION WITH CORONARY/GRAFT ANGIOGRAM  02/05/2013   Procedure: LEFT HEART CATHETERIZATION WITH Beatrix Fetters;  Surgeon: Lorretta Harp, MD;  Location: [facility] CATH LAB;  Service:  Cardiovascular;;  . PERIPHERAL VASCULAR CATHETERIZATION N/A 01/04/2015   Procedure: Lower Extremity Angiography;  Surgeon: Lorretta Harp, MD;  Location: [place] CV LAB;  Service: Cardiovascular;  Laterality: N/A;  . PERIPHERAL VASCULAR CATHETERIZATION N/A 05/11/2016   Procedure: Lower Extremity Angiography;  Surgeon: Lorretta Harp, MD;  Location: [place] CV LAB;  Service: Cardiovascular;  Laterality: N/A;  . PERIPHERAL VASCULAR CATHETERIZATION Right 05/11/2016   Procedure: Peripheral Vascular Balloon Angioplasty;  Surgeon: Lorretta Harp, MD;  Location: [place] [facility] LAB;  Service: Cardiovascular;  Laterality: Right;  SFA  . PROSTATECTOMY    . [place]   "football injury"  . TONSILLECTOMY  1940's     reports that he quit smoking about 37 years ago. His smoking use included Cigarettes. He has a 2.00 pack-year smoking history. He has never used smokeless tobacco. He reports that he drinks alcohol. He reports that he does not use drugs.  No Known Allergies  Family History  Problem Relation Age of Onset  . Adopted: Yes  . Coronary artery disease Sister   . Coronary artery disease Father 75  . Cancer Mother 57   Prior to Admission medications   Medication Sig Start Date End Date Taking? Authorizing Provider  acetaminophen (TYLENOL) 325 MG tablet Take 2 tablets (650 mg total) by mouth every 4 (four) hours as needed for headache or mild pain. 01/05/15  Yes Erlene Quan, PA-C  aspirin EC 81 MG tablet Take 81 mg by mouth daily.   Yes Historical Provider, MD  cholecalciferol (VITAMIN D) 1000 UNITS tablet Take 1,000 Units by mouth daily.   Yes Historical Provider, MD  clopidogrel (PLAVIX) 75 MG tablet TAKE 1 TABLET (75 MG TOTAL) BY MOUTH DAILY WITH BREAKFAST. 03/17/16  Yes Lorretta Harp, MD  isosorbide mononitrate (IMDUR) 30 MG 24 hr tablet Take 1 tablet (30 mg total) by mouth daily. 04/11/16  Yes Lorretta Harp, MD  nitroGLYCERIN (NITROSTAT) 0.4 MG SL tablet  Place 1 tablet (0.4 mg total) under the tongue every 5 (five) minutes as needed for chest pain. 12/11/14  Yes Lorretta Harp, MD  Polyethyl Glycol-Propyl Glycol (SYSTANE) 0.4-0.3 % SOLN Place 1 drop into both eyes 4 (four) times daily as needed (dry eyes).   Yes Historical Provider, MD  metoprolol succinate (TOPROL XL) 25 MG 24 hr tablet Take 1 tablet (25 mg total) by mouth daily. Patient not taking: Reported on 06/07/2016 12/11/14   Lorretta Harp, MD  pravastatin (PRAVACHOL) 40 MG tablet Take 1 tablet (40 mg total) by mouth every evening. Patient not taking: Reported on 06/07/2016 01/13/15   Lorretta Harp, MD    Physical Exam: Vitals:   06/07/16 1500 06/07/16 1600 06/07/16 1732 06/07/16 1758  BP: 137/83 141/89 (!) 147/76 113/81  Pulse:      Resp: (!) 28 20 20 20   Temp:   98.2 F (36.8 C) 98.2 F (36.8 C)  TempSrc:   Oral Oral  SpO2:  Constitutional: NAD, calm, comfortable Eyes: PERRL, lids and conjunctivae normal ENMT: Mucous membranes are dry. Missing teeth  Neck: normal, supple, no masses, no thyromegaly Respiratory: clear to auscultation bilaterally, no wheezing, no crackles. Normal respiratory effort. No accessory muscle use.  Cardiovascular: Tachycardic rate and regular rhythm, no murmurs / rubs / gallops. No extremity edema.  Abdomen: TTP epigastric, voluntary guarding, not rigid. No hepatosplenomegaly. Bowel sounds positive.  Musculoskeletal: no clubbing / cyanosis. No joint deformity upper and lower extremities. Good ROM, no contractures. Normal muscle tone.  Skin: no rashes, lesions, ulcers. No induration Neurologic: CN 2-12 grossly intact. Sensation intact, DTR normal. Strength 5/5 in all 4.  Psychiatric: Normal judgment and insight. Alert and oriented x 3. Normal mood.   Labs on Admission: I have personally reviewed following labs and imaging studies  CBC:  Recent Labs Lab 06/07/16 1257  WBC 14.4*  HGB 7.5*  HCT 24.5*  MCV 88.1  PLT 409*   Basic  Metabolic Panel:  Recent Labs Lab 06/07/16 1257  NA 142  K 4.3  CL 107  CO2 21*  GLUCOSE 135*  BUN 22*  CREATININE 1.20  CALCIUM 9.2   GFR: Estimated Creatinine Clearance: 37.7 mL/min (by C-G formula based on SCr of 1.2 mg/dL). Liver Function Tests: No results for input(s): AST, ALT, ALKPHOS, BILITOT, PROT, ALBUMIN in the last 168 hours. No results for input(s): LIPASE, AMYLASE in the last 168 hours. No results for input(s): AMMONIA in the last 168 hours. Coagulation Profile:  Recent Labs Lab 06/07/16 1257  INR 1.15   Cardiac Enzymes: No results for input(s): CKTOTAL, CKMB, CKMBINDEX, TROPONINI in the last 168 hours. BNP (last 3 results) No results for input(s): PROBNP in the last 8760 hours. HbA1C: No results for input(s): HGBA1C in the last 72 hours. CBG: No results for input(s): GLUCAP in the last 168 hours. Lipid Profile: No results for input(s): CHOL, HDL, LDLCALC, TRIG, CHOLHDL, LDLDIRECT in the last 72 hours. Thyroid Function Tests: No results for input(s): TSH, T4TOTAL, FREET4, T3FREE, THYROIDAB in the last 72 hours. Anemia Panel: No results for input(s): VITAMINB12, FOLATE, FERRITIN, TIBC, IRON, RETICCTPCT in the last 72 hours. Urine analysis:    Component Value Date/Time   COLORURINE AMBER (A) 06/07/2016 1517   APPEARANCEUR CLOUDY (A) 06/07/2016 1517   LABSPEC 1.030 06/07/2016 1517   PHURINE 6.0 06/07/2016 1517   GLUCOSEU NEGATIVE 06/07/2016 1517   HGBUR TRACE (A) 06/07/2016 1517   BILIRUBINUR MODERATE (A) 06/07/2016 1517   KETONESUR 15 (A) 06/07/2016 1517   PROTEINUR 30 (A) 06/07/2016 1517   UROBILINOGEN 0.2 06/11/2013 1448   NITRITE NEGATIVE 06/07/2016 1517   LEUKOCYTESUR NEGATIVE 06/07/2016 1517   Sepsis Labs: !!!!!!!!!!!!!!!!!!!!!!!!!!!!!!!!!!!!!!!!!!!! @LABRCNTIP (procalcitonin:4,lacticidven:4) )No results found for this or any previous visit (from the past 240 hour(s)).   Radiological Exams on Admission: Dg Chest 2 View  Result Date:  06/07/2016 CLINICAL DATA:  Increased weakness, chest pain increased after eating, difficulty swallowing since discharge from hospital following cardiac stent procedure, hoarse voice, history hypertension, coronary artery disease post MI and stenting, prostate cancer EXAM: CHEST  2 VIEW COMPARISON:  04/04/2016 FINDINGS: Normal heart size post CABG. Mediastinal contours and pulmonary vascularity normal. Atherosclerotic calcification aorta. Lungs emphysematous but clear. No pleural effusion or pneumothorax. Bones unremarkable. IMPRESSION: Post CABG. COPD changes without infiltrate. Aortic atherosclerosis. Electronically Signed   By: Lavonia Dana M.D.   On: 06/07/2016 13:26    EKG: Independently reviewed. Sinus tachycardia, nonspecific ST changes in V3-V5.   Assessment/Plan Principal Problem:  Symptomatic anemia Active Problems:   CAD, CABG X 3 9/09. Cath 2012 and 02/05/13- 2/3 grafts occluded- medical Rx   PVD - Rt SFA PTA 8/112   Dehydration   Dysphagia   Odynophagia  Acute on chronic normocytic anemia, possibly secondary to GI blood loss  -Positive FOBT, ED ordered 1u pRBC. Patient constipated for 1.5 weeks, denies any blood loss in bowel movements previously. Last colonoscopy 2005, normal   -Previous Hgb 13 --> 9 --> 7.5. Trend CBC q6h  -On aspirin and plavix s/p recent DES of mid and distal right SFA. Will hold overnight. Need to discuss with cardiology team in AM regarding restarting these medications in setting of worsening anemia  -Spoke with [facility] physician on call (Dr. Fuller Plan) for consult. Likely require EGD for further evaluation. NPO at midnight and team will follow in AM  -Protonix bolus and gtt   Dehydration -Tachycardia and leukocytosis secondary to dehydration, poor oral intake  -IVF   Dysphagia/odynophagia  -SLP evaluation  -Bedside swallow eval  -Clear liquid diet for now  -NPO at midnight   CAD, CABG x3 in 2009, 2012, 2014. Two of 3 grafts occluded  PVD, right SFA  2012 and 2017 -Follows with Dr. [name]   Hx HTN, HLD -Not taking beta blocker or statin at home    DVT prophylaxis: SCDs Code Status: Full  Family Communication: family at bedside Disposition Plan: pending further work up and improvement  Consults called: GI  Admission status: Observation  It is my clinical opinion that referral for OBSERVATION is reasonable and necessary in this 80 y.o. year old male  presenting with symptoms of dysphagia, anemia concerning for GI bleed or esophagitis  in the context of PMH including CAD, GERD, PVD, HTN, HLD  with pertinent physical exam findings of epigastric abdominal pain   and pertinent radiographic and laboratory data including Hgb 7.5 and positive FOBT.  The aforementioned taken together are felt to place the patient at high risk for further  clinical deterioration. However it is anticipated that the patient may be medically stable for discharge from the hospital within 24 to 48 hours.     Dessa Phi, DO [facility] [email or web address] Password [email or web address] 06/07/2016, 6:02 PM

## 2016-06-07 NOTE — ED Triage Notes (Signed)
Patient here with 2 weeks of increased weakness, chest pain and having difficulty swallowing since discharge from hospital following cardiac stent placement. Hoarse voice on assessment. Alert and oriented

## 2016-06-07 NOTE — ED Notes (Signed)
Pt was just brought from radiology in [facility] to room 45

## 2016-06-07 NOTE — ED Provider Notes (Signed)
[facility] DEPT Provider Note   CSN: [id number] Arrival date & time: 06/07/16  1239     History   Chief Complaint Chief Complaint  Patient presents with  . Chest Pain  . Weakness    HPI Tony Curtis is a 80 y.o. male.  Tony Curtis is a 80 yo gentleman with PMH CAD (s/p CABG x3 '09, multiple caths, most recent revealed 2/3 graft occlusion), PVD (s/p R SFA stent placement in Sept 2017), GERD, HTN, HLD, prostate cancer, UTI who presents for progressive dysphagia and inability to swallow food. Over the past 2-3 weeks he has had progressive dysphagia to solids and now thick liquids, with associated odynophagia and burning reflux-like pain deep in his chest that radiates to his back. He is only able to drink thin warm liquids like tea. He often "spits his food back up" along with foamy substance which he states is sometimes bloody. He has had progressive hoarseness over the past month, with fatigue, and 15 lb weight loss since August. He has been bedbound at home for weeks-months with fatigue and dizziness when he tried to stand or walk. He has longstanding chest pain that occurs intermittently on a daily basis, can occur at rest, located at lower costal borders bilaterally, no associated SOB, nausea, diaphoresis, pleuritic component. He reports no constipation but has so little po intake that he has not had a BM in 2+ weeks. He reports mild generalized abdominal pain and reports dark urine. He denies fevers, chills, cough, diarrhea, dysuria.    The history is provided by the patient. No language interpreter was used.    Past Medical History:  Diagnosis Date  . Abnormal EKG, resolved with slower HR 06/05/2013  . Arthritis    "about my whole body"  . CAD (coronary artery disease)    CABG 9/09. Cath 2012 and 02/05/13  . Chronic back pain    "mostly between my shoulders" (01/04/2015)  . Daily headache    "mostly at night; think it's because of the shingles I had" (01/04/2015)  . GERD  (gastroesophageal reflux disease)   . H/O echocardiogram 07/08/2008   EF 40% by 2D '09. EF 60% cath 6/14  . HTN (hypertension)   . Hyperlipemia   . Myocardial infarction 2009  . Prostate cancer ([place])   . PVD (peripheral vascular disease) ([place])    LE dopp (10/19/11) patent R SFA stent, R ant tib occluded with reconstitution within the dorsalis pedis, L SFA 70-99%, L ant tib occluded with reconstitution of flow in the dorsalis pedis, bil ABI 1.2; carotid dopp (04-27-08)-normal  . UTI (urinary tract infection) 06/05/2013    Patient Active Problem List   Diagnosis Date Noted  . Claudication ([place]) 01/04/2015  . Pain in the chest   . Coronary artery disease involving coronary bypass graft of native heart with unstable angina pectoris ([place])   . Shingles outbreak 09/11/2013  . Abnormal EKG, resolved with slower HR 06/05/2013  . UTI (urinary tract infection) 06/05/2013  . Protein-calorie malnutrition, severe, he is on chronic steroids 02/05/2013  . Chest pain, negative MI, adjusted meds 02/04/2013  . HTN (hypertension)   . Hyperlipemia   . CAD, CABG X 3 9/09. Cath 2012 and 02/05/13- 2/3 grafts occluded- medical Rx   . Prostate cancer ([place])   . PVD - Rt SFA PTA 8/112     Past Surgical History:  Procedure Laterality Date  . ABDOMINAL ANGIOGRAM Left 10/13/08   LSFA PTA  . ABDOMINAL ANGIOGRAM Right 07/28/2008  RSFA  . ANGIOPLASTY / STENTING FEMORAL Right 01/04/2015  . CARDIAC CATHETERIZATION  07/04/2011   patent LIMA to diag branch, occluded ben grafts to diag and ramus branch; EF >60%   . CARDIAC CATHETERIZATION  03/20/2008   occluded prox LAD with great collaterals from the ramus and the circ and RCA to LAD; EF 45-50%, med tx  . CARDIAC CATHETERIZATION  02/05/13   SVGs occluded, no change  . CARDIAC CATHETERIZATION N/A 01/04/2015   Procedure: Left Heart Cath and Cors/Grafts Angiography;  Surgeon: Lorretta Harp, MD;  Location: [place] [facility];  Service: Cardiovascular;  Laterality:  N/A;  . CATARACT EXTRACTION W/ INTRAOCULAR LENS  IMPLANT, BILATERAL Bilateral   . CORONARY ARTERY BYPASS GRAFT  05/06/2008   x3, LIMA to LAD, SVG to diad, SVG to ramus inermediate  . FRACTURE SURGERY    . KNEE DISLOCATION SURGERY Right    "they cut it"  . LEFT HEART CATHETERIZATION WITH CORONARY ANGIOGRAM N/A 02/05/2013   Procedure: LEFT HEART CATHETERIZATION WITH CORONARY ANGIOGRAM;  Surgeon: Lorretta Harp, MD;  Location[facility] CATH LAB;  Service: Cardiovascular;  Laterality: N/A;  . LEFT HEART CATHETERIZATION WITH CORONARY/GRAFT ANGIOGRAM  07/04/2011   Procedure: LEFT HEART CATHETERIZATION WITH Beatrix Fetters;  Surgeon: Lorretta Harp, MD;  Location: [facility] CATH LAB;  Service: Cardiovascular;;  . LEFT HEART CATHETERIZATION WITH CORONARY/GRAFT ANGIOGRAM  02/05/2013   Procedure: LEFT HEART CATHETERIZATION WITH Beatrix Fetters;  Surgeon: Lorretta Harp, MD;  Location: [facility] CATH LAB;  Service: Cardiovascular;;  . PERIPHERAL VASCULAR CATHETERIZATION N/A 01/04/2015   Procedure: Lower Extremity Angiography;  Surgeon: Lorretta Harp, MD;  Location: [place] [facility];  Service: Cardiovascular;  Laterality: N/A;  . PERIPHERAL VASCULAR CATHETERIZATION N/A 05/11/2016   Procedure: Lower Extremity Angiography;  Surgeon: Lorretta Harp, MD;  Location: [place] [facility];  Service: Cardiovascular;  Laterality: N/A;  . PERIPHERAL VASCULAR CATHETERIZATION Right 05/11/2016   Procedure: Peripheral Vascular Balloon Angioplasty;  Surgeon: Lorretta Harp, MD;  Location: [place] [facility];  Service: Cardiovascular;  Laterality: Right;  SFA  . PROSTATECTOMY    . [place]   "football injury"  . TONSILLECTOMY  1940's       Home Medications    Prior to Admission medications   Medication Sig Start Date End Date Taking? Authorizing Provider  acetaminophen (TYLENOL) 325 MG tablet Take 2 tablets (650 mg total) by mouth every 4 (four) hours as needed for headache or mild  pain. 01/05/15  Yes Erlene Quan, PA-C  aspirin EC 81 MG tablet Take 81 mg by mouth daily.   Yes Historical Provider, MD  cholecalciferol (VITAMIN D) 1000 UNITS tablet Take 1,000 Units by mouth daily.   Yes Historical Provider, MD  clopidogrel (PLAVIX) 75 MG tablet TAKE 1 TABLET (75 MG TOTAL) BY MOUTH DAILY WITH BREAKFAST. 03/17/16  Yes Lorretta Harp, MD  isosorbide mononitrate (IMDUR) 30 MG 24 hr tablet Take 1 tablet (30 mg total) by mouth daily. 04/11/16  Yes Lorretta Harp, MD  nitroGLYCERIN (NITROSTAT) 0.4 MG SL tablet Place 1 tablet (0.4 mg total) under the tongue every 5 (five) minutes as needed for chest pain. 12/11/14  Yes Lorretta Harp, MD  Polyethyl Glycol-Propyl Glycol (SYSTANE) 0.4-0.3 % SOLN Place 1 drop into both eyes 4 (four) times daily as needed (dry eyes).   Yes Historical Provider, MD  metoprolol succinate (TOPROL XL) 25 MG 24 hr tablet Take 1 tablet (25 mg total) by mouth  daily. Patient not taking: Reported on 06/07/2016 12/11/14   Lorretta Harp, MD  pravastatin (PRAVACHOL) 40 MG tablet Take 1 tablet (40 mg total) by mouth every evening. Patient not taking: Reported on 06/07/2016 01/13/15   Lorretta Harp, MD    Family History Family History  Problem Relation Age of Onset  . Adopted: Yes  . Coronary artery disease Sister   . Coronary artery disease Father 10  . Cancer Mother 82    Social History Social History  Substance Use Topics  . Smoking status: Former Smoker    Packs/day: 0.10    Years: 20.00    Types: Cigarettes    Quit date: 06/14/1979  . Smokeless tobacco: Never Used  . Alcohol use Yes     Comment: 01/04/2015 "might have a glass of wine q couple years"     Allergies   Review of patient's allergies indicates no known allergies.   Review of Systems Review of Systems  Constitutional: Positive for activity change, appetite change, fatigue and unexpected weight change. Negative for chills, diaphoresis and fever.  HENT: Positive for trouble  swallowing and voice change.   Respiratory: Negative for cough, chest tightness, shortness of breath and wheezing.   Cardiovascular: Positive for chest pain. Negative for palpitations and leg swelling.  Gastrointestinal: Positive for abdominal pain. Negative for constipation, diarrhea, nausea, rectal pain and vomiting.  Endocrine: Positive for cold intolerance.  Genitourinary: Positive for hematuria. Negative for dysuria, flank pain and frequency.  Neurological: Positive for dizziness and light-headedness.  All other systems reviewed and are negative.    Physical Exam Updated Vital Signs BP 137/83   Pulse 63   Temp 97.8 F (36.6 C) (Oral)   Resp (!) 28   SpO2 92%   Physical Exam  Constitutional: He is oriented to person, place, and time. No distress.  Cachectic, tired-appearing  HENT:  Head: Normocephalic and atraumatic.  Mildly dry MM  Eyes: EOM are normal. Pupils are equal, round, and reactive to light.  Neck: Normal range of motion. Neck supple. No JVD present.  Cardiovascular: Regular rhythm, normal heart sounds and intact distal pulses.  Exam reveals no gallop and no friction rub.   No murmur heard. Tachycardic rate, 120s  Pulmonary/Chest: Effort normal and breath sounds normal. No respiratory distress. He has no wheezes. He has no rales. He exhibits tenderness (mild at right and left costal margin).  Abdominal: Soft. Bowel sounds are normal. He exhibits no distension and no mass. There is tenderness (generalized and also at LUQ). There is no rebound and no guarding.  Genitourinary: Rectal exam shows guaiac positive stool.  Musculoskeletal: Normal range of motion. He exhibits no edema, tenderness or deformity.  Lymphadenopathy:    He has no cervical adenopathy.  Neurological: He is alert and oriented to person, place, and time.  Skin: Skin is warm and dry. Capillary refill takes less than 2 seconds. No rash noted. He is not diaphoretic. No erythema. There is pallor.    Psychiatric: He has a normal mood and affect. His behavior is normal. Thought content normal.     ED Treatments / Results  Labs (all labs ordered are listed, but only abnormal results are displayed) Labs Reviewed  BASIC METABOLIC PANEL - Abnormal; Notable for the following:       Result Value   CO2 21 (*)    Glucose, Bld 135 (*)    BUN 22 (*)    GFR calc non Af Amer 56 (*)    All  other components within normal limits  CBC - Abnormal; Notable for the following:    WBC 14.4 (*)    RBC 2.78 (*)    Hemoglobin 7.5 (*)    HCT 24.5 (*)    RDW 15.7 (*)    Platelets 409 (*)    All other components within normal limits  URINALYSIS, ROUTINE W REFLEX MICROSCOPIC (NOT AT [facility]) - Abnormal; Notable for the following:    Color, Urine AMBER (*)    APPearance CLOUDY (*)    Hgb urine dipstick TRACE (*)    Bilirubin Urine MODERATE (*)    Ketones, ur 15 (*)    Protein, ur 30 (*)    All other components within normal limits  OCCULT BLOOD X 1 CARD TO LAB, STOOL - Abnormal; Notable for the following:    Fecal Occult Bld POSITIVE (*)    All other components within normal limits  URINE MICROSCOPIC-ADD ON - Abnormal; Notable for the following:    Squamous Epithelial / LPF 0-5 (*)    Bacteria, UA RARE (*)    Casts HYALINE CASTS (*)    All other components within normal limits  PROTIME-INR  I-STAT TROPOININ, ED  TYPE AND SCREEN  PREPARE RBC (CROSSMATCH)    EKG  EKG Interpretation  Date/Time:  Wednesday June 07 2016 12:46:29 EDT Ventricular Rate:  127 PR Interval:  122 QRS Duration: 88 QT Interval:  324 QTC Calculation: 470 R Axis:   89 Text Interpretation:  Sinus tachycardia Non-specific ST-t changes Confirmed by Ashok Cordia  MD, Lennette Bihari ([id number]) on 06/07/2016 1:02:15 PM Also confirmed by Ashok Cordia  MD, Lennette Bihari ([id number]), editor Yehuda Mao [phone])  on 06/07/2016 1:26:08 PM       Radiology Dg Chest 2 View  Result Date: 06/07/2016 CLINICAL DATA:  Increased weakness, chest pain increased  after eating, difficulty swallowing since discharge from hospital following cardiac stent procedure, hoarse voice, history hypertension, coronary artery disease post MI and stenting, prostate cancer EXAM: CHEST  2 VIEW COMPARISON:  04/04/2016 FINDINGS: Normal heart size post CABG. Mediastinal contours and pulmonary vascularity normal. Atherosclerotic calcification aorta. Lungs emphysematous but clear. No pleural effusion or pneumothorax. Bones unremarkable. IMPRESSION: Post CABG. COPD changes without infiltrate. Aortic atherosclerosis. Electronically Signed   By: Lavonia Dana M.D.   On: 06/07/2016 13:26    Procedures Procedures (including critical care time)  Medications Ordered in ED Medications  0.9 %  sodium chloride infusion (not administered)  sodium chloride 0.9 % bolus 1,000 mL (1,000 mLs Intravenous New Bag/Given 06/07/16 1432)     Initial Impression / Assessment and Plan / ED Course  I have reviewed the triage vital signs and the nursing notes.  Pertinent labs & imaging results that were available during my care of the patient were reviewed by me and considered in my medical decision making (see chart for details).  Clinical Course   Mr. Buras is a 80 yo gentleman with PMH CAD, PVD, GERD who presents with progressive dysphagia/odynophagia, failure to thrive with weight loss and fatigue, and new symptomatic anemia. Suspect esophageal cancer vs other GI malignancy. Hb 7.5 from 13 in early September.  Will give 1L IVF, 1u pRBCs, check U/A, FOBT hemoccult, will likely require CT C/A/P and further workup, admit to hospitalist service - observation/telemetry.  FOBT positive  Final Clinical Impressions(s) / ED Diagnoses   Final diagnoses:  Symptomatic anemia  Failure to thrive in adult  Esophageal dysphagia    New Prescriptions New Prescriptions   No medications on file  Asencion Partridge, MD 06/07/16 1607    Asencion Partridge, MD 06/07/16 [place], MD 06/08/16  [phone]

## 2016-06-07 NOTE — ED Notes (Signed)
Pt in X ray

## 2016-06-08 ENCOUNTER — Telehealth: Payer: Self-pay | Admitting: Cardiology

## 2016-06-08 DIAGNOSIS — K319 Disease of stomach and duodenum, unspecified: Secondary | ICD-10-CM | POA: Diagnosis not present

## 2016-06-08 DIAGNOSIS — I739 Peripheral vascular disease, unspecified: Secondary | ICD-10-CM | POA: Diagnosis not present

## 2016-06-08 DIAGNOSIS — E860 Dehydration: Secondary | ICD-10-CM | POA: Diagnosis not present

## 2016-06-08 DIAGNOSIS — R634 Abnormal weight loss: Secondary | ICD-10-CM | POA: Diagnosis not present

## 2016-06-08 DIAGNOSIS — R1013 Epigastric pain: Secondary | ICD-10-CM

## 2016-06-08 DIAGNOSIS — R195 Other fecal abnormalities: Secondary | ICD-10-CM | POA: Diagnosis not present

## 2016-06-08 DIAGNOSIS — D500 Iron deficiency anemia secondary to blood loss (chronic): Secondary | ICD-10-CM | POA: Diagnosis not present

## 2016-06-08 DIAGNOSIS — G8929 Other chronic pain: Secondary | ICD-10-CM

## 2016-06-08 DIAGNOSIS — C801 Malignant (primary) neoplasm, unspecified: Secondary | ICD-10-CM | POA: Diagnosis not present

## 2016-06-08 DIAGNOSIS — R627 Adult failure to thrive: Secondary | ICD-10-CM | POA: Diagnosis not present

## 2016-06-08 DIAGNOSIS — R1310 Dysphagia, unspecified: Secondary | ICD-10-CM | POA: Diagnosis not present

## 2016-06-08 DIAGNOSIS — E876 Hypokalemia: Secondary | ICD-10-CM | POA: Diagnosis not present

## 2016-06-08 DIAGNOSIS — J690 Pneumonitis due to inhalation of food and vomit: Secondary | ICD-10-CM | POA: Diagnosis not present

## 2016-06-08 DIAGNOSIS — D649 Anemia, unspecified: Secondary | ICD-10-CM | POA: Diagnosis not present

## 2016-06-08 LAB — CBC
HCT: 23.2 % — ABNORMAL LOW (ref 39.0–52.0)
HCT: 29.9 % — ABNORMAL LOW (ref 39.0–52.0)
HEMATOCRIT: 23.8 % — AB (ref 39.0–52.0)
HEMATOCRIT: 22.6 % — AB (ref 39.0–52.0)
Hemoglobin: 7.6 g/dL — ABNORMAL LOW (ref 13.0–17.0)
Hemoglobin: 7.2 g/dL — ABNORMAL LOW (ref 13.0–17.0)
Hemoglobin: 7.3 g/dL — ABNORMAL LOW (ref 13.0–17.0)
Hemoglobin: 9.5 g/dL — ABNORMAL LOW (ref 13.0–17.0)
MCH: 27.5 pg (ref 26.0–34.0)
MCH: 27.1 pg (ref 26.0–34.0)
MCH: 26.8 pg (ref 26.0–34.0)
MCH: 27.2 pg (ref 26.0–34.0)
MCHC: 31.9 g/dL (ref 30.0–36.0)
MCHC: 31.9 g/dL (ref 30.0–36.0)
MCHC: 31.5 g/dL (ref 30.0–36.0)
MCHC: 31.8 g/dL (ref 30.0–36.0)
MCV: 86.2 fL (ref 78.0–100.0)
MCV: 85 fL (ref 78.0–100.0)
MCV: 85.3 fL (ref 78.0–100.0)
MCV: 85.7 fL (ref 78.0–100.0)
PLATELETS: 304 10*3/uL (ref 150–400)
PLATELETS: 294 10*3/uL (ref 150–400)
Platelets: 345 10*3/uL (ref 150–400)
Platelets: 304 10*3/uL (ref 150–400)
RBC: 2.76 MIL/uL — ABNORMAL LOW (ref 4.22–5.81)
RBC: 2.66 MIL/uL — ABNORMAL LOW (ref 4.22–5.81)
RBC: 2.72 MIL/uL — AB (ref 4.22–5.81)
RBC: 3.49 MIL/uL — AB (ref 4.22–5.81)
RDW: 15.6 % — AB (ref 11.5–15.5)
RDW: 15.4 % (ref 11.5–15.5)
RDW: 15.5 % (ref 11.5–15.5)
RDW: 15.9 % — ABNORMAL HIGH (ref 11.5–15.5)
WBC: 12.1 10*3/uL — ABNORMAL HIGH (ref 4.0–10.5)
WBC: 12.1 10*3/uL — AB (ref 4.0–10.5)
WBC: 12.4 10*3/uL — AB (ref 4.0–10.5)
WBC: 15.4 10*3/uL — AB (ref 4.0–10.5)

## 2016-06-08 LAB — BASIC METABOLIC PANEL
Anion gap: 9 (ref 5–15)
BUN: 14 mg/dL (ref 6–20)
CALCIUM: 8.1 mg/dL — AB (ref 8.9–10.3)
CO2: 21 mmol/L — ABNORMAL LOW (ref 22–32)
Chloride: 113 mmol/L — ABNORMAL HIGH (ref 101–111)
Creatinine, Ser: 0.96 mg/dL (ref 0.61–1.24)
GFR calc Af Amer: >60 mL/min (ref >60)
GLUCOSE: 99 mg/dL (ref 65–99)
Potassium: 3.8 mmol/L (ref 3.5–5.1)
Sodium: 143 mmol/L (ref 135–145)

## 2016-06-08 LAB — PREPARE RBC (CROSSMATCH)

## 2016-06-08 MED ORDER — POLYETHYLENE GLYCOL 3350 17 G PO PACK
17.0000 g | PACK | Freq: Two times a day (BID) | ORAL | Status: DC
  Administered 2016-06-08 – 2016-06-13 (×8): 17 g via ORAL
  Filled 2016-06-08 (×10): qty 1

## 2016-06-08 MED ORDER — SODIUM CHLORIDE 0.9 % IV SOLN
Freq: Once | INTRAVENOUS | Status: AC
  Administered 2016-06-08: 11:00:00 via INTRAVENOUS

## 2016-06-08 MED ORDER — ADULT MULTIVITAMIN LIQUID CH
15.0000 mL | Freq: Every day | ORAL | Status: DC
  Administered 2016-06-09 – 2016-06-13 (×5): 15 mL via ORAL
  Filled 2016-06-08 (×7): qty 15

## 2016-06-08 MED ORDER — ACETAMINOPHEN 325 MG PO TABS
650.0000 mg | ORAL_TABLET | Freq: Four times a day (QID) | ORAL | Status: DC | PRN
  Administered 2016-06-08 – 2016-06-11 (×3): 650 mg via ORAL
  Filled 2016-06-08 (×4): qty 2

## 2016-06-08 MED ORDER — BOOST / RESOURCE BREEZE PO LIQD
1.0000 | Freq: Three times a day (TID) | ORAL | Status: DC
  Administered 2016-06-08 – 2016-06-09 (×2): 1 via ORAL

## 2016-06-08 MED ORDER — HYDRALAZINE HCL 20 MG/ML IJ SOLN
10.0000 mg | Freq: Once | INTRAMUSCULAR | Status: DC
  Filled 2016-06-08: qty 1

## 2016-06-08 NOTE — Telephone Encounter (Signed)
New Message  Dr. Jacob Moores call requesting to speak to Dr. Ellyn Hack. Please call back to discuss

## 2016-06-08 NOTE — Progress Notes (Signed)
Initial Nutrition Assessment  DOCUMENTATION CODES:   Severe malnutrition in context of chronic illness, Underweight  INTERVENTION:  Provide Boost Breeze po TID, each supplement provides 250 kcal and 9 grams of protein Provide Multivitamin with minerals daily Recommend additional dose of thiamine- 100 mg daily for one week.   Monitor magnesium, potassium, and phosphorus daily for at least 3 days, MD to replete as needed, as pt is at risk for refeeding syndrome given minimal PO intake > 7 days, severe malnutrition, and severe weight loss.  Recommend placing Cortrak NGT for short term nutrition support until PO intake improves. Initiate Jevity 1.2 @ 20 ml/hr via NGT and increase by 10 ml every 4 hours to goal rate of 65 ml/hr.   Tube feeding regimen provides 1872 kcal (100% of needs), 87 grams of protein, and 1264 ml of H2O.  1560 ml    NUTRITION DIAGNOSIS:   Inadequate oral intake related to dysphagia as evidenced by severe depletion of body fat, severe depletion of muscle mass, percent weight loss.   GOAL:   Patient will meet greater than or equal to 90% of their needs   MONITOR:   Diet advancement, PO intake, Supplement acceptance, Weight trends, Labs, Skin, I & O's  REASON FOR ASSESSMENT:   Malnutrition Screening Tool    ASSESSMENT:   80 y.o. African-American male with a past medical history significant for CAD status post CABG 3 in 2009, peripheral vascular disease status post right SFA stent placement in September 2017, hypertension, hyperlipidemia, GERD and prostate cancer who presented to the ED on 06/07/16 with a complaint of progressive dysphagia and inability to swallow food along with chest pain and constipation  Pt confirms history in chart that he has been able to eat or drink much for the past month. He reports losing 40 lbs during this time. Per weight history, he has lost from 130 lbs to 107 lbs in the past month- 18% weight loss is severe for time frame. He  has severe muscle wasting and severe fat wasting per nutrition-focused physical exam. He reports being weak and tired and primarily bed bound for the past few weeks. He reports trying to drink some Glucerna Shakes at home, but intake was minimal.   Labs: low hemoglobin, low calcium  Diet Order:  Diet clear liquid Room service appropriate? Yes; Fluid consistency: Thin Diet NPO time specified  Skin:  Reviewed, no issues  Last BM:  PTA  Height:   Ht Readings from Last 1 Encounters:  06/07/16 5\' 10"  (1.778 m)    Weight:   Wt Readings from Last 1 Encounters:  06/08/16 107 lb 4.8 oz (48.7 kg)    Ideal Body Weight:  75.5 kg  BMI:  Body mass index is 15.4 kg/m.  Estimated Nutritional Needs:   Kcal:  1700-1900  Protein:  70-80 grams  Fluid:  1.7 L/day  EDUCATION NEEDS:   No education needs identified at this time  [place], CSP, LDN Inpatient Clinical Dietitian Pager: [phone] After Hours Pager: [phone]

## 2016-06-08 NOTE — Progress Notes (Signed)
One unit of blood completed without acute deviation noted.CBC Post transfusion approximately 2300. HGB post transfusion was 7.6 ( prior was 7.5). Awkwar therefore a repeat confirmed 7.2. [place] notified via [place].

## 2016-06-08 NOTE — Evaluation (Signed)
Clinical/Bedside Swallow Evaluation Patient Details  Name: Tony Curtis MRN: [id number] Date of Birth: Mar 16, 1936  Today's Date: 06/08/2016 Time: SLP Start Time (ACUTE ONLY): 1419 SLP Stop Time (ACUTE ONLY): 1430 SLP Time Calculation (min) (ACUTE ONLY): 11 min  Past Medical History:  Past Medical History:  Diagnosis Date  . Abnormal EKG, resolved with slower HR 06/05/2013  . Arthritis    "about my whole body"  . CAD (coronary artery disease)    CABG 9/09. Cath 2012 and 02/05/13  . Chronic back pain    "mostly between my shoulders" (01/04/2015)  . Daily headache    "mostly at night; think it's because of the shingles I had" (01/04/2015)  . GERD (gastroesophageal reflux disease)   . H/O echocardiogram 07/08/2008   EF 40% by 2D '09. EF 60% cath 6/14  . HTN (hypertension)   . Hyperlipemia   . Myocardial infarction 2009  . Prostate cancer ([place])   . PVD (peripheral vascular disease) ([place])    LE dopp (10/19/11) patent R SFA stent, R ant tib occluded with reconstitution within the dorsalis pedis, L SFA 70-99%, L ant tib occluded with reconstitution of flow in the dorsalis pedis, bil ABI 1.2; carotid dopp (04-27-08)-normal  . UTI (urinary tract infection) 06/05/2013   Past Surgical History:  Past Surgical History:  Procedure Laterality Date  . ABDOMINAL ANGIOGRAM Left 10/13/08   LSFA PTA  . ABDOMINAL ANGIOGRAM Right 07/28/2008   RSFA  . ANGIOPLASTY / STENTING FEMORAL Right 01/04/2015  . CARDIAC CATHETERIZATION  07/04/2011   patent LIMA to diag branch, occluded ben grafts to diag and ramus branch; EF >60%   . CARDIAC CATHETERIZATION  03/20/2008   occluded prox LAD with great collaterals from the ramus and the circ and RCA to LAD; EF 45-50%, med tx  . CARDIAC CATHETERIZATION  02/05/13   SVGs occluded, no change  . CARDIAC CATHETERIZATION N/A 01/04/2015   Procedure: Left Heart Cath and Cors/Grafts Angiography;  Surgeon: Lorretta Harp, MD;  Location: [facility];  Service:  Cardiovascular;  Laterality: N/A;  . CATARACT EXTRACTION W/ INTRAOCULAR LENS  IMPLANT, BILATERAL Bilateral   . CORONARY ARTERY BYPASS GRAFT  05/06/2008   x3, LIMA to LAD, SVG to diad, SVG to ramus inermediate  . FRACTURE SURGERY    . KNEE DISLOCATION SURGERY Right    "they cut it"  . LEFT HEART CATHETERIZATION WITH CORONARY ANGIOGRAM N/A 02/05/2013   Procedure: LEFT HEART CATHETERIZATION WITH CORONARY ANGIOGRAM;  Surgeon: Lorretta Harp, MD;  Location: [facility] CATH LAB;  Service: Cardiovascular;  Laterality: N/A;  . LEFT HEART CATHETERIZATION WITH CORONARY/GRAFT ANGIOGRAM  07/04/2011   Procedure: LEFT HEART CATHETERIZATION WITH Tony Curtis;  Surgeon: Lorretta Harp, MD;  Location: [facility] CATH LAB;  Service: Cardiovascular;;  . LEFT HEART CATHETERIZATION WITH CORONARY/GRAFT ANGIOGRAM  02/05/2013   Procedure: LEFT HEART CATHETERIZATION WITH Tony Curtis;  Surgeon: Lorretta Harp, MD;  [facility] C[facility];  Service: Cardiovascular;;  . PERIPHERAL VASCULAR CATHETERIZATION N/A 01/04/2015   Procedure: Lower Extremity Angiography;  Surgeon: Lorretta Harp, MD;  Location: [place] [facility];  Service: Cardiovascular;  Laterality: N/A;  . PERIPHERAL VASCULAR CATHETERIZATION N/A 05/11/2016   Procedure: Lower Extremity Angiography;  Surgeon: Lorretta Harp, MD;  Location: [place] [facility];  Service: Cardiovascular;  Laterality: N/A;  . PERIPHERAL VASCULAR CATHETERIZATION Right 05/11/2016   Procedure: Peripheral Vascular Balloon Angioplasty;  Surgeon: Lorretta Harp, MD;  Location: [place] [facility];  Service: Cardiovascular;  Laterality:  Right;  SFA  . PROSTATECTOMY    . [place]   "football injury"  . TONSILLECTOMY  1940's   HPI:  80 y.o.malewith medical history significant of CAD (s/p CABG x3 '09, multiple caths, PVD, GERD, HTN, HLD, MI, prostate cancer who presents to [facility] with symptoms of dysphagia of solids and liquids, odynophagia,  substernal chest pain, epigastric abdominal pain, generalized weakness, constipation. Per chart he has had hoarse voice, pain with swallowing both liquids and solids as well as difficulty swallowing food and liquid and regurgitates food almost every time he eats. Also admits to constipation, no BM for 1.5 weeks. CXR COPD changes without infiltrate   Assessment / Plan / Recommendation Clinical Impression  Pt reports difficulty swallowing for awhile which continues to worsen. Suspect a primary esophageal dysphagia given pt's subjective report and SLP observation. Pt reports frequent globus sensation, expectoration of mucous/food. Clinical observation revealed multiple swallows suggestive of pharyngeal or cervical esophagus residue and expectoration of mucous. Evidence of aspiration not present however pt is at risk for post prandial difficulty. Pt scheduled for EGD next date. Recommend continue clear liquids, small sips. Educated pt re: esophageal precautions. Will follow up next date.     Aspiration Risk  Moderate aspiration risk    Diet Recommendation Thin liquid (clear liquid)   Liquid Administration via: Cup;Straw Medication Administration: Crushed with puree Supervision: Patient able to self feed Compensations: Slow rate;Small sips/bites;Follow solids with liquid Postural Changes: Seated upright at 90 degrees;Remain upright for at least 30 minutes after po intake    Other  Recommendations Oral Care Recommendations: Oral care BID   Follow up Recommendations  (TBD)      Frequency and Duration min 2x/week  2 weeks       Prognosis Prognosis for Safe Diet Advancement:  (fair-good)      Swallow Study   General HPI: 80 y.o.malewith medical history significant of CAD (s/p CABG x3 '09, multiple caths, PVD, GERD, HTN, HLD, MI, prostate cancer who presents to [facility] with symptoms of dysphagia of solids and liquids, odynophagia, substernal chest pain, epigastric abdominal pain,  generalized weakness, constipation. Per chart he has had hoarse voice, pain with swallowing both liquids and solids as well as difficulty swallowing food and liquid and regurgitates food almost every time he eats. Also admits to constipation, no BM for 1.5 weeks. CXR COPD changes without infiltrate Type of Study: Bedside Swallow Evaluation Previous Swallow Assessment:  (none) Diet Prior to this Study: Thin liquids (clear liquids) Temperature Spikes Noted: No Respiratory Status: Room air History of Recent Intubation: No Behavior/Cognition: Alert;Cooperative Oral Cavity Assessment: Within Functional Limits Oral Care Completed by SLP: No Oral Cavity - Dentition: Poor condition;Missing dentition Vision: Functional for self-feeding Self-Feeding Abilities: Able to feed self Patient Positioning: Upright in bed Baseline Vocal Quality: Hoarse Volitional Cough: Strong Volitional Swallow: Able to elicit    Oral/Motor/Sensory Function Overall Oral Motor/Sensory Function: Within functional limits   Ice Chips Ice chips: Not tested   Thin Liquid Thin Liquid: Impaired Presentation: Straw Oral Phase Impairments:  (none) Pharyngeal  Phase Impairments: Multiple swallows    Nectar Thick Nectar Thick Liquid: Not tested   Honey Thick Honey Thick Liquid: Not tested   Puree Puree: Impaired Presentation: Self Fed;Spoon Pharyngeal Phase Impairments: Multiple swallows   Solid   GO   Solid: Not tested        Houston Siren 06/08/2016,2:55 PM Orbie Pyo Colvin Caroli.Ed [employer] [phone]

## 2016-06-08 NOTE — Consult Note (Signed)
Consultation  Referring Provider:  Dr. Cathlean Sauer    Primary Care Physician:  Unknown Primary Gastroenterologist: Pt does not know, seen at [facility] in [place] in the past for colo, ?EGD      Reason for Consultation: Dysphagia,              HPI:   Tony Curtis is a 80 y.o. African-American male with a past medical history significant for CAD status post CABG 3 in 2009, peripheral vascular disease status post right SFA stent placement in September 2017, hypertension, hyperlipidemia, GERD and prostate cancer who presented to the ED on 06/07/16 with a complaint of progressive dysphagia and inability to swallow food along with chest pain and constipation.  Today, it should be noted that the patient is a poor historian, he tells me that he recently was in the hospital "for a procedure on my leg", sometime right before then he describes having worsening dysphagia, telling me that it has gotten progressively harder to swallow anything including even "sips of water" with my meds. He also describes several episodes of regurgitation. Accompanying this, he has also had esophageal pain "which goes all the way to the bottom of my stomach", he describes this as a burning. This is not made worse when he tries to eat something. Due to this, the patient has not been eating well for the past mos and describes a weight loss of 40 pounds. (per our records approx 20 lbs in the past month). Patient admits to worsening hoarseness and fatigue along with dizziness upon standing.  Patient also describes constipation but blames this on "not eating very much", he tells me he has not had a bowel movement in 2 weeks. The last one he had was only after drinking "castor oil" and was very black in appearance.  Associated symptoms include a mild generalized abdominal pain.  Patient also tells me he has chest pain that occurs intermittently on daily basis and can occur at rest. This is unrelated to eating.  Patient's social history  is positive for recently just "sitting around the house or laying in bed" over the past month because he no longer works and has been undergoing various evals for PVD and his intermittent chest pain.  Patient's medical history is positive for being maintained on aspirin and Plavix ever since recent DES angioplasty of mid and distal right SFA in September 2017. Last dose of Plavix Tuesday, 06/06/16 am.  Patient denies fever, chills, reflux, use of NSAID's or previous episodes of similar symptoms.  Previous GI workup: 06/29/11-esophagram, [facility] [place]-impression: No evidence of Zenker's diverticulum was observed during the course of the study, decreased primary peristaltic wave with incomplete clearing of the esophagus, with proximal escape from the distal esophagus. Minimal tertiary contractions were observed under fluoroscopy. These findings were consistent with a nonspecific esophageal motility disorder. No esophageal masses, strictures, or mucosal lesions were identified during the course of the exam.; Plan: Recommendations for outpatient manometry testing ( patient does not remember this taking place) Colo and ?EGD at [place] in [place] ?3-4 years ago per pt report (we do not have records)  Past Medical History:  Diagnosis Date  . Abnormal EKG, resolved with slower HR 06/05/2013  . Arthritis    "about my whole body"  . CAD (coronary artery disease)    CABG 9/09. Cath 2012 and 02/05/13  . Chronic back pain    "mostly between my shoulders" (01/04/2015)  . Daily headache    "mostly at night;  think it's because of the shingles I had" (01/04/2015)  . GERD (gastroesophageal reflux disease)   . H/O echocardiogram 07/08/2008   EF 40% by 2D '09. EF 60% cath 6/14  . HTN (hypertension)   . Hyperlipemia   . Myocardial infarction 2009  . Prostate cancer ([place])   . PVD (peripheral vascular disease) ([place])    LE dopp (10/19/11) patent R SFA stent, R ant tib occluded with reconstitution within the dorsalis  pedis, L SFA 70-99%, L ant tib occluded with reconstitution of flow in the dorsalis pedis, bil ABI 1.2; carotid dopp (04-27-08)-normal  . UTI (urinary tract infection) 06/05/2013    Past Surgical History:  Procedure Laterality Date  . ABDOMINAL ANGIOGRAM Left 10/13/08   LSFA PTA  . ABDOMINAL ANGIOGRAM Right 07/28/2008   RSFA  . ANGIOPLASTY / STENTING FEMORAL Right 01/04/2015  . CARDIAC CATHETERIZATION  07/04/2011   patent LIMA to diag branch, occluded ben grafts to diag and ramus branch; EF >60%   . CARDIAC CATHETERIZATION  03/20/2008   occluded prox LAD with great collaterals from the ramus and the circ and RCA to LAD; EF 45-50%, med tx  . CARDIAC CATHETERIZATION  02/05/13   SVGs occluded, no change  . CARDIAC CATHETERIZATION N/A 01/04/2015   Procedure: Left Heart Cath and Cors/Grafts Angiography;  Surgeon: Lorretta Harp, MD;  Location: [place] [facility];  Service: Cardiovascular;  Laterality: N/A;  . CATARACT EXTRACTION W/ INTRAOCULAR LENS  IMPLANT, BILATERAL Bilateral   . CORONARY ARTERY BYPASS GRAFT  05/06/2008   x3, LIMA to LAD, SVG to diad, SVG to ramus inermediate  . FRACTURE SURGERY    . KNEE DISLOCATION SURGERY Right    "they cut it"  . LEFT HEART CATHETERIZATION WITH CORONARY ANGIOGRAM N/A 02/05/2013   Procedure: LEFT HEART CATHETERIZATION WITH CORONARY ANGIOGRAM;  Surgeon: Lorretta Harp, MD;  Location: [facility] CATH LAB;  Service: Cardiovascular;  Laterality: N/A;  . LEFT HEART CATHETERIZATION WITH CORONARY/GRAFT ANGIOGRAM  07/04/2011   Procedure: LEFT HEART CATHETERIZATION WITH Beatrix Fetters;  Surgeon: Lorretta Harp, MD;  Location: [facility] CATH LAB;  Service: Cardiovascular;;  . LEFT HEART CATHETERIZATION WITH CORONARY/GRAFT ANGIOGRAM  02/05/2013   Procedure: LEFT HEART CATHETERIZATION WITH Beatrix Fetters;  Surgeon: Lorretta Harp, MD;  Location: [facility] CATH LAB;  Service: Cardiovascular;;  . PERIPHERAL VASCULAR CATHETERIZATION N/A 01/04/2015   Procedure: Lower  Extremity Angiography;  Surgeon: Lorretta Harp, MD;  Location: [place] [facility];  Service: Cardiovascular;  Laterality: N/A;  . PERIPHERAL VASCULAR CATHETERIZATION N/A 05/11/2016   Procedure: Lower Extremity Angiography;  Surgeon: Lorretta Harp, MD;  Location: [place] [facility];  Service: Cardiovascular;  Laterality: N/A;  . PERIPHERAL VASCULAR CATHETERIZATION Right 05/11/2016   Procedure: Peripheral Vascular Balloon Angioplasty;  Surgeon: Lorretta Harp, MD;  Location: [place] [facility];  Service: Cardiovascular;  Laterality: Right;  SFA  . PROSTATECTOMY    . [place]   "football injury"  . TONSILLECTOMY  1940's    Family History  Problem Relation Age of Onset  . Adopted: Yes  . Coronary artery disease Sister   . Coronary artery disease Father 57  . Cancer Mother 79    Social History  Substance Use Topics  . Smoking status: Former Smoker    Packs/day: 0.10    Years: 20.00    Types: Cigarettes    Quit date: 06/14/1979  . Smokeless tobacco: Never Used  . Alcohol use Yes     Comment: 01/04/2015 "  might have a glass of wine q couple years"    Prior to Admission medications   Medication Sig Start Date End Date Taking? Authorizing Provider  acetaminophen (TYLENOL) 325 MG tablet Take 2 tablets (650 mg total) by mouth every 4 (four) hours as needed for headache or mild pain. 01/05/15  Yes Erlene Quan, PA-C  aspirin EC 81 MG tablet Take 81 mg by mouth daily.   Yes Historical Provider, MD  cholecalciferol (VITAMIN D) 1000 UNITS tablet Take 1,000 Units by mouth daily.   Yes Historical Provider, MD  clopidogrel (PLAVIX) 75 MG tablet TAKE 1 TABLET (75 MG TOTAL) BY MOUTH DAILY WITH BREAKFAST. 03/17/16  Yes Lorretta Harp, MD  isosorbide mononitrate (IMDUR) 30 MG 24 hr tablet Take 1 tablet (30 mg total) by mouth daily. 04/11/16  Yes Lorretta Harp, MD  nitroGLYCERIN (NITROSTAT) 0.4 MG SL tablet Place 1 tablet (0.4 mg total) under the tongue every 5 (five) minutes  as needed for chest pain. 12/11/14  Yes Lorretta Harp, MD  Polyethyl Glycol-Propyl Glycol (SYSTANE) 0.4-0.3 % SOLN Place 1 drop into both eyes 4 (four) times daily as needed (dry eyes).   Yes Historical Provider, MD  metoprolol succinate (TOPROL XL) 25 MG 24 hr tablet Take 1 tablet (25 mg total) by mouth daily. Patient not taking: Reported on 06/07/2016 12/11/14   Lorretta Harp, MD  pravastatin (PRAVACHOL) 40 MG tablet Take 1 tablet (40 mg total) by mouth every evening. Patient not taking: Reported on 06/07/2016 01/13/15   Lorretta Harp, MD    Current Facility-Administered Medications  Medication Dose Route Frequency Provider Last Rate Last Dose  . 0.9 %  sodium chloride infusion   Intravenous Continuous Shon Millet, DO 100 mL/hr at 06/07/16 2234    . pantoprazole (PROTONIX) 80 mg in sodium chloride 0.9 % 250 mL (0.32 mg/mL) infusion  8 mg/hr Intravenous Continuous Shon Millet, DO 25 mL/hr at 06/07/16 2217 8 mg/hr at 06/07/16 2217  . [START ON 06/11/2016] pantoprazole (PROTONIX) injection 40 mg  40 mg Intravenous Q12H [employer], DO      . pneumococcal 23 valent vaccine (PNU-IMMUNE) injection 0.5 mL  0.5 mL Intramuscular Tomorrow-1000 Shon Millet, DO      . sodium chloride flush (NS) 0.9 % injection 3 mL  3 mL Intravenous Q12H Shon Millet, DO   3 mL at 06/07/16 2130    Allergies as of 06/07/2016  . (No Known Allergies)     Review of Systems:     Constitutional: Positive for weight loss of 20# in past mos, weakness and fatigue  No fever or chills HEENT: Eyes: No change in vision               Ears, Nose, Throat:  No change in hearing or congestion Skin: No rash or itching Cardiovascular: Positive for chest pain No chest pressure or palpitations   Respiratory: No SOB or cough Gastrointestinal: See HPI and otherwise negative Genitourinary: No dysuria or change in urinary frequency Neurological: Positive for dizziness  upon standing No headache or syncope Musculoskeletal: No new muscle or joint pain Hematologic: Positive for episode of "black stool 2 weeks ago No bruising Psychiatric: No history of depression or anxiety   Physical Exam:  Vital signs in last 24 hours: Temp:  [97.8 F (36.6 C)-99.5 F (37.5 C)] 98.6 F (37 C) (10/26 0819) Pulse Rate:  [63-104] 104 (10/26 0819) Resp:  [14-28] 14 (10/26 0819) BP: (96-147)/(65-89) 118/69 (  10/26 0819) SpO2:  [92 %-100 %] 99 % (10/26 0819) Weight:  [104 lb 14.4 oz (47.6 kg)-107 lb 4.8 oz (48.7 kg)] 107 lb 4.8 oz (48.7 kg) (10/26 0649) Last BM Date:  (pt stated it has been about 2 weeks) General:   Pleasant African American male appears to be in NAD, Well developed, thin appearing, alert and cooperative Head:  Normocephalic and atraumatic. Eyes:   PEERL, EOMI. No icterus. Conjunctiva pink. Ears:  Normal auditory acuity. Neck:  Supple Throat: Oral cavity and pharynx without inflammation, swelling or lesion. Poor dentition Lungs: Respirations even and unlabored. Lungs clear to auscultation bilaterally.   No wheezes, crackles, or rhonchi.  Heart: Normal S1, S2. No MRG. Regular rate and rhythm. No peripheral edema, cyanosis or pallor.  Abdomen:  Soft, nondistended,mild ttp epigastrum No rebound or guarding. Normal bowel sounds. No appreciable masses or hepatomegaly. Rectal:  Not performed.  Msk:  Symmetrical without gross deformities.  Extremities:  Without edema, no deformity or joint abnormality. Normal ROM Neurologic:  Alert and  oriented x4;  grossly normal neurologically. CN II-XII intact.  Skin:   Dry and intact without significant lesions or rashes. Psychiatric: Oriented to person, place and time. Demonstrates good judgement and reason without abnormal affect or behaviors.   LAB RESULTS:  Recent Labs  06/07/16 2326 06/08/16 0333 06/08/16 0616  WBC 12.1* 12.1* 12.4*  HGB 7.6* 7.2* 7.3*  HCT 23.8* 22.6* 23.2*  PLT 345 304 304   BMET  Recent  Labs  06/07/16 1257 06/08/16 0616  NA 142 143  K 4.3 3.8  CL 107 113*  CO2 21* 21*  GLUCOSE 135* 99  BUN 22* 14  CREATININE 1.20 0.96  CALCIUM 9.2 8.1*   LFT No results for input(s): PROT, ALBUMIN, AST, ALT, ALKPHOS, BILITOT, BILIDIR, IBILI in the last 72 hours. PT/INR  Recent Labs  06/07/16 1257  LABPROT 14.8  INR 1.15    STUDIES: Dg Chest 2 View  Result Date: 06/07/2016 CLINICAL DATA:  Increased weakness, chest pain increased after eating, difficulty swallowing since discharge from hospital following cardiac stent procedure, hoarse voice, history hypertension, coronary artery disease post MI and stenting, prostate cancer EXAM: CHEST  2 VIEW COMPARISON:  04/04/2016 FINDINGS: Normal heart size post CABG. Mediastinal contours and pulmonary vascularity normal. Atherosclerotic calcification aorta. Lungs emphysematous but clear. No pleural effusion or pneumothorax. Bones unremarkable. IMPRESSION: Post CABG. COPD changes without infiltrate. Aortic atherosclerosis. Electronically Signed   By: Lavonia Dana M.D.   On: 06/07/2016 13:26     PREVIOUS ENDOSCOPIES:            At [facility] in [place], we do not have records   Impression / Plan:  Impression: 1. Dysphagia/Odynophagia: Per chart review this appears to be a chronic problem for the patient, though somewhat worsened lately, patient did have an esophagram in 2012 at [place] which did show motility disorder, over past mos now with liquids and solids, patient also describes some "pain" with swallowing resulting in decreased PO intake and weight loss, no history of recent steroids or inhalers, no signs of oral candida, ?EGD 3-4 years ago at [place]?; Consider esophageal ring vs web vs stricture vs mass vs infection vs GERD 2. Constipation: Patient reports no bowel movement in 2 weeks, along with decreased by mouth intake 3. Weight loss: 20# over past mos, likely due to inactivity and decreased PO intake 4. Anemia: Found to be FOBT positive  in the ER, current hemoglobin 7.3, documented at 13 on 04/27/16,  has remained somewhat stable since time of admission yesterday around noon, he will be given 1 unit of PRBCs today, also vague h/o one episode of "black stool" 2 weeks ago at time of last reported BM 5. Epigastric Pain: patient reports pain from esophagus down to "bottom of my stomach"; Consider relation to GERD vs other 6. H/O CAD: pt s/p CABG x 3 2009 7. PVD: S/p DES placement Sept 2017 8. Prostate Cancer  Plan: 1. Will change diet to clear liquids today and NPO after midnight for EGD tomorrow 2. Continue to hold Plavix 3. Continue to monitor hgb with transfusion <7, will order another unit prbcs to be transfused today per Dr. Loletha Carrow 4. Continue supportive measures 5. Discussed EGD with patient including risks, benefits, limitations and alternatives, he agrees to proceed. Will schedule tomorrow with Dr. Loletha Carrow.  6. Will start Miralax po BID 7. Will send request for records from past endoscopic procedures to [facility] 8. Continue Protonix 40mg  BID 9. Please await further recommendations from Dr. Loletha Carrow  Thank you for your kind consultation, we will continue to follow.  Lavone Nian Lemmon  06/08/2016, 9:01 AM Pager #: [phone]   I have reviewed the entire case in detail with the above APP and discussed the plan in detail.  Therefore, I agree with the diagnoses recorded above. In addition,  I have personally interviewed and examined the patient and have personally reviewed any abdominal/pelvic CT scan images.  My additional thoughts are as follows:  The patient's voice is somewhat hoarse, but he says it is been this way for years.  He has obvious protein calorie malnutrition with temporal wasting, and it is difficult to believe that this 40 pound weight loss is only been within the last month. Certainly looks chronically unwell and is having great difficulty swallowing.  Current plan is for an upper  endoscopy tomorrow, even though he is on dual antiplatelet therapy. I probably could not perform a balloon dilation or similar intervention, but I would like to at least rule out malignancy or something easily treatable like esophageal candidiasis or reflux esophagitis. He is agreeable after thorough discussion of the procedure and its risks in the presence of his nurse.    Nelida Meuse III Pager [phone]  Mon-Fri 8a-5p [phone] after 5p, weekends, holidays

## 2016-06-08 NOTE — Progress Notes (Signed)
PROGRESS NOTE    Tony Curtis  [id number] DOB: 06/30/1936 DOA: 06/07/2016 PCP: Pcp Not In System    Brief Narrative:  80 yo male with cad and pvd, sp stent on right lower extremity, on plavix, presents with dysphagia and odinophagia. Found with severe anemia and FOBT positive.   Assessment & Plan:   Principal Problem:   Symptomatic anemia Active Problems:   CAD, CABG X 3 9/09. Cath 2012 and 02/05/13- 2/3 grafts occluded- medical Rx   PVD - Rt SFA PTA 8/112   Dehydration   Dysphagia   Odynophagia   1. Acute blood loss anemia. No signs of active bleeding today, will continue blood transfusion and follow cell count in am. Hold on antiplatelet therapy for now.  2. Suspected upper gi bleed.Will continue anti acid therapy and antiemetics. Follow on GI recommendations, may need EGD.  3. Dehydration. Continue gentle hydration. Follow renal panel in am.  4. PVD status post stent on right lower extremity. Old records personally reviewed, stent placed last month, drug eluding stent, I call cardiology office to inform about patient;s condition and holding on plavix.  5. HTN.Continue blood pressure control.   DVT prophylaxis: scd Code Status: full  Family Communication:  I spoke with family at the bedside and all questions were addressed. Disposition Plan: HOme  Consultants:   Gastroenterology  Procedures:   Antimicrobials:   Subjective: Patient with no nausea or vomiting, no dyspnea. Had odynophagia and dysphagia.  Objective: Vitals:   06/08/16 0819 06/08/16 1120 06/08/16 1141 06/08/16 1430  BP: 118/69 127/70 (!) 144/76 134/75  Pulse: (!) 104 (!) 102 97 (!) 106  Resp: 14 18 18 20   Temp: 98.6 F (37 C) 98.6 F (37 C) 97.9 F (36.6 C) 99.1 F (37.3 C)  TempSrc: Oral Oral Oral Oral  SpO2: 99% 100% 100% 100%  Weight:      Height:        Intake/Output Summary (Last 24 hours) at 06/08/16 2035 Last data filed at 06/08/16 1911  Gross per 24 hour  Intake           1173.33 ml  Output             1525 ml  Net          -351.67 ml   Filed Weights   06/07/16 1732 06/08/16 0649  Weight: 47.6 kg (104 lb 14.4 oz) 48.7 kg (107 lb 4.8 oz)    Examination:  General exam: deconditioned. E ENT: mild pallor, oral mucosa moist  Respiratory system: Clear to auscultation. Respiratory effort normal. Mild decreased breath sounds at bases. Cardiovascular system: S1 & S2 heard, RRR. No JVD, murmurs, rubs, gallops or clicks. No pedal edema. Gastrointestinal system: Abdomen is nondistended, soft but mild tender to deep palpation. No organomegaly or masses felt. Normal bowel sounds heard. Central nervous system: Alert and oriented. No focal neurological deficits. Extremities: Symmetric 5 x 5 power. Skin: No rashes, lesions or ulcers     Data Reviewed: I have personally reviewed following labs and imaging studies  CBC:  Recent Labs Lab 06/07/16 1257 06/07/16 2326 06/08/16 0333 06/08/16 0616 06/08/16 1614  WBC 14.4* 12.1* 12.1* 12.4* 15.4*  HGB 7.5* 7.6* 7.2* 7.3* 9.5*  HCT 24.5* 23.8* 22.6* 23.2* 29.9*  MCV 88.1 86.2 85.0 85.3 85.7  PLT 409* 345 304 304 [id number]   Basic Metabolic Panel:  Recent Labs Lab 06/07/16 1257 06/08/16 0616  NA 142 143  K 4.3 3.8  CL 107 113*  CO2  21* 21*  GLUCOSE 135* 99  BUN 22* 14  CREATININE 1.20 0.96  CALCIUM 9.2 8.1*   GFR: Estimated Creatinine Clearance: 43.7 mL/min (by C-G formula based on SCr of 0.96 mg/dL). Liver Function Tests: No results for input(s): AST, ALT, ALKPHOS, BILITOT, PROT, ALBUMIN in the last 168 hours. No results for input(s): LIPASE, AMYLASE in the last 168 hours. No results for input(s): AMMONIA in the last 168 hours. Coagulation Profile:  Recent Labs Lab 06/07/16 1257  INR 1.15   Cardiac Enzymes: No results for input(s): CKTOTAL, CKMB, CKMBINDEX, TROPONINI in the last 168 hours. BNP (last 3 results) No results for input(s): PROBNP in the last 8760 hours. HbA1C: No results for  input(s): HGBA1C in the last 72 hours. CBG: No results for input(s): GLUCAP in the last 168 hours. Lipid Profile: No results for input(s): CHOL, HDL, LDLCALC, TRIG, CHOLHDL, LDLDIRECT in the last 72 hours. Thyroid Function Tests: No results for input(s): TSH, T4TOTAL, FREET4, T3FREE, THYROIDAB in the last 72 hours. Anemia Panel: No results for input(s): VITAMINB12, FOLATE, FERRITIN, TIBC, IRON, RETICCTPCT in the last 72 hours. Sepsis Labs: No results for input(s): PROCALCITON, LATICACIDVEN in the last 168 hours.  No results found for this or any previous visit (from the past 240 hour(s)).       Radiology Studies: Dg Chest 2 View  Result Date: 06/07/2016 CLINICAL DATA:  Increased weakness, chest pain increased after eating, difficulty swallowing since discharge from hospital following cardiac stent procedure, hoarse voice, history hypertension, coronary artery disease post MI and stenting, prostate cancer EXAM: CHEST  2 VIEW COMPARISON:  04/04/2016 FINDINGS: Normal heart size post CABG. Mediastinal contours and pulmonary vascularity normal. Atherosclerotic calcification aorta. Lungs emphysematous but clear. No pleural effusion or pneumothorax. Bones unremarkable. IMPRESSION: Post CABG. COPD changes without infiltrate. Aortic atherosclerosis. Electronically Signed   By: Lavonia Dana M.D.   On: 06/07/2016 13:26        Scheduled Meds: . feeding supplement  1 Container Oral TID BM  . multivitamin  15 mL Oral Daily  . [START ON 06/11/2016] pantoprazole  40 mg Intravenous Q12H  . pneumococcal 23 valent vaccine  0.5 mL Intramuscular Tomorrow-1000  . polyethylene glycol  17 g Oral BID  . sodium chloride flush  3 mL Intravenous Q12H   Continuous Infusions: . sodium chloride 100 mL/hr at 06/08/16 2013  . pantoprozole (PROTONIX) infusion 8 mg/hr (06/08/16 2012)     LOS: 0 days       Mauricio Gerome Apley, MD [facility] Pager [phone]  If 7PM-7AM, please contact  night-coverage [email or web address] Password [facility] 06/08/2016, 8:35 PM

## 2016-06-09 ENCOUNTER — Observation Stay (HOSPITAL_COMMUNITY): Payer: Medicare Other | Admitting: Anesthesiology

## 2016-06-09 ENCOUNTER — Encounter (HOSPITAL_COMMUNITY)
Admission: EM | Disposition: A | Discharge status: Hospice | Payer: Self-pay | Source: Home / Self Care | Attending: Internal Medicine

## 2016-06-09 ENCOUNTER — Encounter (HOSPITAL_COMMUNITY): Payer: Self-pay

## 2016-06-09 DIAGNOSIS — E876 Hypokalemia: Secondary | ICD-10-CM

## 2016-06-09 DIAGNOSIS — K319 Disease of stomach and duodenum, unspecified: Secondary | ICD-10-CM | POA: Diagnosis not present

## 2016-06-09 DIAGNOSIS — D649 Anemia, unspecified: Secondary | ICD-10-CM | POA: Diagnosis not present

## 2016-06-09 DIAGNOSIS — D500 Iron deficiency anemia secondary to blood loss (chronic): Secondary | ICD-10-CM | POA: Diagnosis not present

## 2016-06-09 DIAGNOSIS — I739 Peripheral vascular disease, unspecified: Secondary | ICD-10-CM | POA: Diagnosis not present

## 2016-06-09 HISTORY — PX: ESOPHAGOGASTRODUODENOSCOPY (EGD) WITH PROPOFOL: SHX5813

## 2016-06-09 LAB — TYPE AND SCREEN
ABO/RH(D): AB POS
ANTIBODY SCREEN: NEGATIVE
UNIT DIVISION: 0
Unit division: 0

## 2016-06-09 LAB — CBC WITH DIFFERENTIAL/PLATELET
Basophils Absolute: 0 10*3/uL (ref 0.0–0.1)
Basophils Relative: 0 %
EOS ABS: 0 10*3/uL (ref 0.0–0.7)
EOS PCT: 0 %
HCT: 28.2 % — ABNORMAL LOW (ref 39.0–52.0)
Hemoglobin: 9 g/dL — ABNORMAL LOW (ref 13.0–17.0)
LYMPHS ABS: 1.5 10*3/uL (ref 0.7–4.0)
Lymphocytes Relative: 10 %
MCH: 26.9 pg (ref 26.0–34.0)
MCHC: 31.9 g/dL (ref 30.0–36.0)
MCV: 84.4 fL (ref 78.0–100.0)
Monocytes Absolute: 1.2 10*3/uL — ABNORMAL HIGH (ref 0.1–1.0)
Monocytes Relative: 7 %
Neutro Abs: 13.3 10*3/uL — ABNORMAL HIGH (ref 1.7–7.7)
Neutrophils Relative %: 83 %
PLATELETS: 286 10*3/uL (ref 150–400)
RBC: 3.34 MIL/uL — AB (ref 4.22–5.81)
RDW: 16.4 % — AB (ref 11.5–15.5)
WBC: 16.1 10*3/uL — AB (ref 4.0–10.5)

## 2016-06-09 LAB — BASIC METABOLIC PANEL
Anion gap: 5 (ref 5–15)
BUN: 9 mg/dL (ref 6–20)
CALCIUM: 7.8 mg/dL — AB (ref 8.9–10.3)
CO2: 21 mmol/L — ABNORMAL LOW (ref 22–32)
CREATININE: 0.85 mg/dL (ref 0.61–1.24)
Chloride: 114 mmol/L — ABNORMAL HIGH (ref 101–111)
GFR calc Af Amer: >60 mL/min (ref >60)
Glucose, Bld: 139 mg/dL — ABNORMAL HIGH (ref 65–99)
POTASSIUM: 3.1 mmol/L — AB (ref 3.5–5.1)
SODIUM: 140 mmol/L (ref 135–145)

## 2016-06-09 SURGERY — ESOPHAGOGASTRODUODENOSCOPY (EGD) WITH PROPOFOL
Anesthesia: Monitor Anesthesia Care

## 2016-06-09 MED ORDER — ENSURE ENLIVE PO LIQD
237.0000 mL | Freq: Three times a day (TID) | ORAL | Status: DC
  Administered 2016-06-09 – 2016-06-13 (×5): 237 mL via ORAL

## 2016-06-09 MED ORDER — LIDOCAINE HCL (CARDIAC) 20 MG/ML IV SOLN
INTRAVENOUS | Status: DC | PRN
  Administered 2016-06-09: 60 mg via INTRATRACHEAL

## 2016-06-09 MED ORDER — PROPOFOL 500 MG/50ML IV EMUL
INTRAVENOUS | Status: DC | PRN
  Administered 2016-06-09: 100 ug/kg/min via INTRAVENOUS

## 2016-06-09 MED ORDER — ALBUTEROL SULFATE (2.5 MG/3ML) 0.083% IN NEBU
INHALATION_SOLUTION | RESPIRATORY_TRACT | Status: AC
  Filled 2016-06-09: qty 3

## 2016-06-09 MED ORDER — FENTANYL CITRATE (PF) 100 MCG/2ML IJ SOLN: 25.0000 ug | INTRAMUSCULAR | Status: DC | PRN

## 2016-06-09 MED ORDER — MORPHINE SULFATE (PF) 2 MG/ML IV SOLN
1.0000 mg | INTRAVENOUS | Status: DC | PRN
  Administered 2016-06-10 (×3): 1 mg via INTRAVENOUS
  Filled 2016-06-09 (×4): qty 1

## 2016-06-09 MED ORDER — ONDANSETRON HCL 4 MG/2ML IJ SOLN: 4.0000 mg | INTRAMUSCULAR | Status: DC | PRN

## 2016-06-09 MED ORDER — ALBUTEROL SULFATE (2.5 MG/3ML) 0.083% IN NEBU
2.5000 mg | INHALATION_SOLUTION | Freq: Once | RESPIRATORY_TRACT | Status: AC
  Administered 2016-06-09: 2.5 mg via RESPIRATORY_TRACT

## 2016-06-09 MED ORDER — FENTANYL CITRATE (PF) 100 MCG/2ML IJ SOLN
INTRAMUSCULAR | Status: DC | PRN
  Administered 2016-06-09: 25 ug via INTRAVENOUS
  Administered 2016-06-09: 50 ug via INTRAVENOUS

## 2016-06-09 MED ORDER — ONDANSETRON HCL 4 MG/2ML IJ SOLN: 4.0000 mg | Freq: Once | INTRAMUSCULAR | Status: DC | PRN

## 2016-06-09 MED ORDER — POTASSIUM CL IN DEXTROSE 5% 20 MEQ/L IV SOLN
20.0000 meq | INTRAVENOUS | Status: DC
  Administered 2016-06-09 – 2016-06-10 (×2): 20 meq via INTRAVENOUS
  Filled 2016-06-09 (×3): qty 1000

## 2016-06-09 MED ORDER — POTASSIUM CHLORIDE 20 MEQ PO PACK
40.0000 meq | PACK | Freq: Once | ORAL | Status: AC
  Administered 2016-06-09: 40 meq via ORAL
  Filled 2016-06-09: qty 2

## 2016-06-09 MED ORDER — METOPROLOL SUCCINATE ER 25 MG PO TB24
25.0000 mg | ORAL_TABLET | Freq: Every day | ORAL | Status: DC
  Administered 2016-06-09 – 2016-06-12 (×4): 25 mg via ORAL
  Filled 2016-06-09 (×5): qty 1

## 2016-06-09 MED ORDER — BUTAMBEN-TETRACAINE-BENZOCAINE 2-2-14 % EX AERO
INHALATION_SPRAY | CUTANEOUS | Status: DC | PRN
  Administered 2016-06-09: 2 via TOPICAL

## 2016-06-09 MED ORDER — SODIUM CHLORIDE 0.9 % IV SOLN: INTRAVENOUS | Status: DC

## 2016-06-09 NOTE — Progress Notes (Signed)
Oral temp =  101.8 and WBC from last afternoon = 15.4. Rectal temp = 102.4. MD paged. Orders received for Blood Cultures x2, then Tylenol. Repeat rectal temp = 100.9. AM oral temp = 100.6 and WBC = 16.1. Will continue to monitor.

## 2016-06-09 NOTE — Telephone Encounter (Signed)
I'm not familiar with this patient is followed by Dr. Obie Dredge, MD

## 2016-06-09 NOTE — Transfer of Care (Signed)
Immediate Anesthesia Transfer of Care Note  Patient: Tony Curtis  Procedure(s) Performed: Procedure(s): ESOPHAGOGASTRODUODENOSCOPY (EGD) WITH PROPOFOL (N/A)  Patient Location: PACU and Endoscopy Unit  Anesthesia Type:MAC  Level of Consciousness: sedated  Airway & Oxygen Therapy: Patient Spontanous Breathing and Patient connected to nasal cannula oxygen  Post-op Assessment: Report given to RN and Post -op Vital signs reviewed and stable  Post vital signs: Reviewed and stable  Last Vitals:  Vitals:   06/09/16 0807 06/09/16 1106  BP:  140/71  Pulse:  (!) 110  Resp:  20  Temp: 37.5 C 37.7 C    Last Pain:  Vitals:   06/09/16 1106  TempSrc: Oral  PainSc: 8          Complications: No apparent anesthesia complications

## 2016-06-09 NOTE — Progress Notes (Signed)
PROGRESS NOTE    Tony Curtis  [id number] DOB: September 02, 1935 DOA: 06/07/2016 PCP: Pcp Not In System   Brief Narrative:  80 yo male with cad and pvd, sp stent on right lower extremity, on plavix, presents with dysphagia and odinophagia. Found with severe anemia and FOBT positive   Assessment & Plan:   Principal Problem:   Symptomatic anemia Active Problems:   CAD, CABG X 3 9/09. Cath 2012 and 02/05/13- 2/3 grafts occluded- medical Rx   PVD - Rt SFA PTA 8/112   Dehydration   Esophageal dysphagia   Odynophagia   1. Acute blood loss anemia. Hb 9.0 and hct 28, stable. Patient sp 2 units prbc. Transfused. Will continue to follow hb and hct.   2. Suspected upper gi bleed/ malignant gastric tumor . Noted a large, necrotic, fungating, friable mass with oozing bleeding per endoscopy. Very likely malignancy. Will continue to hold antiplatelet therapy, discusses with GI. Patient may need further work up with CT chest and abdomen in am. Full liquid diet, continue supportive IV fluids, antiemetics and antiacids.   3. Dehydration with hypokalemia. Continue gentle hydration. Will change IV fluids to  d51.2 with 20 kcl at 75 cc/hr. $00 kcl po, follow renal panel in am. Avoid hypotension or nephrotoxic medications.   4. PVD status post stent on right lower extremity. Old records personally reviewed, stent placed last month, drug eluding stent/ balloon angioplasty, cardiology on call informed, for now will continue to hold on antiplatelet therapy.   5. HTN.Continue blood pressure control, will resume metoprolol due to tachycardia. No signs of active signs of bleeding.    DVT prophylaxis: scd Code Status: full  Family Communication:  I spoke with family at the bedside and all questions were addressed. Disposition Plan: HOme  Consultants:   Gastroenterology   Subjective: Patient with positive chest pain and epigastric pain, no nausea or vomiting. No dyspnea or leg pain.    Objective: Vitals:   06/09/16 1322 06/09/16 1330 06/09/16 1340 06/09/16 1350  BP:  (!) 152/79 139/73 (!) 147/84  Pulse:  (!) 122 (!) 114 (!) 111  Resp:  (!) 37 (!) 29 (!) 25  Temp:      TempSrc:      SpO2: 94% 96%    Weight:      Height:        Intake/Output Summary (Last 24 hours) at 06/09/16 1402 Last data filed at 06/09/16 1322  Gross per 24 hour  Intake          4899.17 ml  Output              925 ml  Net          3974.17 ml   Filed Weights   06/08/16 0649 06/09/16 0638 06/09/16 1106  Weight: 48.7 kg (107 lb 4.8 oz) 51.2 kg (112 lb 12.8 oz) 50.8 kg (112 lb)    Examination:  General exam: Appears calm and comfortable  Respiratory system: Clear to auscultation. Respiratory effort normal. Cardiovascular system: S1 & S2 heard, RRR. No JVD, murmurs, rubs, gallops or clicks. No pedal edema. Gastrointestinal system: Abdomen is nondistended, soft and nontender. No organomegaly or masses felt. Normal bowel sounds heard. Central nervous system: Alert and oriented. No focal neurological deficits. Extremities: Symmetric 5 x 5 power. Skin: No rashes, lesions or ulcers Psychiatry: Judgement and insight appear normal. Mood & affect appropriate.     Data Reviewed: I have personally reviewed following labs and imaging studies  CBC:  Recent  Labs Lab 06/07/16 2326 06/08/16 0333 06/08/16 0616 06/08/16 1614 06/09/16 0337  WBC 12.1* 12.1* 12.4* 15.4* 16.1*  NEUTROABS  --   --   --   --  13.3*  HGB 7.6* 7.2* 7.3* 9.5* 9.0*  HCT 23.8* 22.6* 23.2* 29.9* 28.2*  MCV 86.2 85.0 85.3 85.7 84.4  PLT 345 304 304 294 [id number]   Basic Metabolic Panel:  Recent Labs Lab 06/07/16 1257 06/08/16 0616 06/09/16 0337  NA 142 143 140  K 4.3 3.8 3.1*  CL 107 113* 114*  CO2 21* 21* 21*  GLUCOSE 135* 99 139*  BUN 22* 14 9  CREATININE 1.20 0.96 0.85  CALCIUM 9.2 8.1* 7.8*   GFR: Estimated Creatinine Clearance: 51.5 mL/min (by C-G formula based on SCr of 0.85 mg/dL). Liver Function  Tests: No results for input(s): AST, ALT, ALKPHOS, BILITOT, PROT, ALBUMIN in the last 168 hours. No results for input(s): LIPASE, AMYLASE in the last 168 hours. No results for input(s): AMMONIA in the last 168 hours. Coagulation Profile:  Recent Labs Lab 06/07/16 1257  INR 1.15   Cardiac Enzymes: No results for input(s): CKTOTAL, CKMB, CKMBINDEX, TROPONINI in the last 168 hours. BNP (last 3 results) No results for input(s): PROBNP in the last 8760 hours. HbA1C: No results for input(s): HGBA1C in the last 72 hours. CBG: No results for input(s): GLUCAP in the last 168 hours. Lipid Profile: No results for input(s): CHOL, HDL, LDLCALC, TRIG, CHOLHDL, LDLDIRECT in the last 72 hours. Thyroid Function Tests: No results for input(s): TSH, T4TOTAL, FREET4, T3FREE, THYROIDAB in the last 72 hours. Anemia Panel: No results for input(s): VITAMINB12, FOLATE, FERRITIN, TIBC, IRON, RETICCTPCT in the last 72 hours. Sepsis Labs: No results for input(s): PROCALCITON, LATICACIDVEN in the last 168 hours.  Recent Results (from the past 240 hour(s))  Culture, blood (Routine X 2) w Reflex to ID Panel     Status: None (Preliminary result)   Collection Time: 06/08/16 10:45 PM  Result Value Ref Range Status   Specimen Description BLOOD RIGHT ARM  Final   Special Requests BOTTLES DRAWN AEROBIC AND ANAEROBIC 5ML  Final   Culture NO GROWTH < 24 HOURS  Final   Report Status PENDING  Incomplete  Culture, blood (Routine X 2) w Reflex to ID Panel     Status: None (Preliminary result)   Collection Time: 06/08/16 10:49 PM  Result Value Ref Range Status   Specimen Description BLOOD LEFT HAND  Final   Special Requests AEROBIC BOTTLE ONLY 5ML  Final   Culture NO GROWTH < 24 HOURS  Final   Report Status PENDING  Incomplete         Radiology Studies: No results found.      Scheduled Meds: . [MAR Hold] feeding supplement  1 Container Oral TID BM  . [MAR Hold] multivitamin  15 mL Oral Daily  . [MAR  Hold] pantoprazole  40 mg Intravenous Q12H  . [MAR Hold] pneumococcal 23 valent vaccine  0.5 mL Intramuscular Tomorrow-1000  . [MAR Hold] polyethylene glycol  17 g Oral BID  . [MAR Hold] sodium chloride flush  3 mL Intravenous Q12H   Continuous Infusions: . sodium chloride 75 mL/hr at 06/08/16 2203  . sodium chloride    . pantoprozole (PROTONIX) infusion 8 mg/hr (06/08/16 2012)     LOS: 0 days       Dontasia Miranda Gerome Apley, MD [facility] Pager [phone]  If 7PM-7AM, please contact night-coverage [email or web address] Password [email or web address] 06/09/2016, 2:02 PM

## 2016-06-09 NOTE — Progress Notes (Signed)
Speech addendum    06/08/16 1418  SLP G-Codes **NOT FOR INPATIENT CLASS**  Functional Assessment Tool Used skilled clinical judgement  Functional Limitations Swallowing  Swallow Current Status [id number]) CK  Swallow Goal Status [id number]) CK  Swallow Discharge Status [id number]) CK  SLP Evaluations  $ SLP Speech Visit 1 Procedure  SLP Evaluations  $BSS Swallow 1 Procedure  Tony Curtis [place] M.Ed [employer] [phone]

## 2016-06-09 NOTE — H&P (View-Only) (Signed)
Consultation  Referring Provider:  Dr. Cathlean Sauer    Primary Care Physician:  Unknown Primary Gastroenterologist: Pt does not know, seen at [facility] in [place] in the past for colo, ?EGD      Reason for Consultation: Dysphagia,              HPI:   Tony Curtis is a 80 y.o. African-American male with a past medical history significant for CAD status post CABG 3 in 2009, peripheral vascular disease status post right SFA stent placement in September 2017, hypertension, hyperlipidemia, GERD and prostate cancer who presented to the ED on 06/07/16 with a complaint of progressive dysphagia and inability to swallow food along with chest pain and constipation.  Today, it should be noted that the patient is a poor historian, he tells me that he recently was in the hospital "for a procedure on my leg", sometime right before then he describes having worsening dysphagia, telling me that it has gotten progressively harder to swallow anything including even "sips of water" with my meds. He also describes several episodes of regurgitation. Accompanying this, he has also had esophageal pain "which goes all the way to the bottom of my stomach", he describes this as a burning. This is not made worse when he tries to eat something. Due to this, the patient has not been eating well for the past mos and describes a weight loss of 40 pounds. (per our records approx 20 lbs in the past month). Patient admits to worsening hoarseness and fatigue along with dizziness upon standing.  Patient also describes constipation but blames this on "not eating very much", he tells me he has not had a bowel movement in 2 weeks. The last one he had was only after drinking "castor oil" and was very black in appearance.  Associated symptoms include a mild generalized abdominal pain.  Patient also tells me he has chest pain that occurs intermittently on daily basis and can occur at rest. This is unrelated to eating.  Patient's social history  is positive for recently just "sitting around the house or laying in bed" over the past month because he no longer works and has been undergoing various evals for PVD and his intermittent chest pain.  Patient's medical history is positive for being maintained on aspirin and Plavix ever since recent DES angioplasty of mid and distal right SFA in September 2017. Last dose of Plavix Tuesday, 06/06/16 am.  Patient denies fever, chills, reflux, use of NSAID's or previous episodes of similar symptoms.  Previous GI workup: 06/29/11-esophagram, [facility] [place]-impression: No evidence of Zenker's diverticulum was observed during the course of the study, decreased primary peristaltic wave with incomplete clearing of the esophagus, with proximal escape from the distal esophagus. Minimal tertiary contractions were observed under fluoroscopy. These findings were consistent with a nonspecific esophageal motility disorder. No esophageal masses, strictures, or mucosal lesions were identified during the course of the exam.; Plan: Recommendations for outpatient manometry testing ( patient does not remember this taking place) Colo and ?EGD at [place] in [place] ?3-4 years ago per pt report (we do not have records)  Past Medical History:  Diagnosis Date  . Abnormal EKG, resolved with slower HR 06/05/2013  . Arthritis    "about my whole body"  . CAD (coronary artery disease)    CABG 9/09. Cath 2012 and 02/05/13  . Chronic back pain    "mostly between my shoulders" (01/04/2015)  . Daily headache    "mostly at night;  think it's because of the shingles I had" (01/04/2015)  . GERD (gastroesophageal reflux disease)   . H/O echocardiogram 07/08/2008   EF 40% by 2D '09. EF 60% cath 6/14  . HTN (hypertension)   . Hyperlipemia   . Myocardial infarction 2009  . Prostate cancer ([place])   . PVD (peripheral vascular disease) ([place])    LE dopp (10/19/11) patent R SFA stent, R ant tib occluded with reconstitution within the dorsalis  pedis, L SFA 70-99%, L ant tib occluded with reconstitution of flow in the dorsalis pedis, bil ABI 1.2; carotid dopp (04-27-08)-normal  . UTI (urinary tract infection) 06/05/2013    Past Surgical History:  Procedure Laterality Date  . ABDOMINAL ANGIOGRAM Left 10/13/08   LSFA PTA  . ABDOMINAL ANGIOGRAM Right 07/28/2008   RSFA  . ANGIOPLASTY / STENTING FEMORAL Right 01/04/2015  . CARDIAC CATHETERIZATION  07/04/2011   patent LIMA to diag branch, occluded ben grafts to diag and ramus branch; EF >60%   . CARDIAC CATHETERIZATION  03/20/2008   occluded prox LAD with great collaterals from the ramus and the circ and RCA to LAD; EF 45-50%, med tx  . CARDIAC CATHETERIZATION  02/05/13   SVGs occluded, no change  . CARDIAC CATHETERIZATION N/A 01/04/2015   Procedure: Left Heart Cath and Cors/Grafts Angiography;  Surgeon: Lorretta Harp, MD;  Location: [place] [facility];  Service: Cardiovascular;  Laterality: N/A;  . CATARACT EXTRACTION W/ INTRAOCULAR LENS  IMPLANT, BILATERAL Bilateral   . CORONARY ARTERY BYPASS GRAFT  05/06/2008   x3, LIMA to LAD, SVG to diad, SVG to ramus inermediate  . FRACTURE SURGERY    . KNEE DISLOCATION SURGERY Right    "they cut it"  . LEFT HEART CATHETERIZATION WITH CORONARY ANGIOGRAM N/A 02/05/2013   Procedure: LEFT HEART CATHETERIZATION WITH CORONARY ANGIOGRAM;  Surgeon: Lorretta Harp, MD;  Location: [facility] CATH LAB;  Service: Cardiovascular;  Laterality: N/A;  . LEFT HEART CATHETERIZATION WITH CORONARY/GRAFT ANGIOGRAM  07/04/2011   Procedure: LEFT HEART CATHETERIZATION WITH Beatrix Fetters;  Surgeon: Lorretta Harp, MD;  Location: [facility] CATH LAB;  Service: Cardiovascular;;  . LEFT HEART CATHETERIZATION WITH CORONARY/GRAFT ANGIOGRAM  02/05/2013   Procedure: LEFT HEART CATHETERIZATION WITH Beatrix Fetters;  Surgeon: Lorretta Harp, MD;  Location: [facility] C[facility] LAB;  Service: Cardiovascular;;  . PERIPHERAL VASCULAR CATHETERIZATION N/A 01/04/2015   Procedure: Lower  Extremity Angiography;  Surgeon: Lorretta Harp, MD;  Location: [facility] [facility];  Service: Cardiovascular;  Laterality: N/A;  . PERIPHERAL VASCULAR CATHETERIZATION N/A 05/11/2016   Procedure: Lower Extremity Angiography;  Surgeon: Lorretta Harp, MD;  Location: [place] [facility];  Service: Cardiovascular;  Laterality: N/A;  . PERIPHERAL VASCULAR CATHETERIZATION Right 05/11/2016   Procedure: Peripheral Vascular Balloon Angioplasty;  Surgeon: Lorretta Harp, MD;  Location: [place] [facility];  Service: Cardiovascular;  Laterality: Right;  SFA  . PROSTATECTOMY    . [place]   "football injury"  . TONSILLECTOMY  1940's    Family History  Problem Relation Age of Onset  . Adopted: Yes  . Coronary artery disease Sister   . Coronary artery disease Father 31  . Cancer Mother 2    Social History  Substance Use Topics  . Smoking status: Former Smoker    Packs/day: 0.10    Years: 20.00    Types: Cigarettes    Quit date: 06/14/1979  . Smokeless tobacco: Never Used  . Alcohol use Yes     Comment: 01/04/2015 "  might have a glass of wine q couple years"    Prior to Admission medications   Medication Sig Start Date End Date Taking? Authorizing Provider  acetaminophen (TYLENOL) 325 MG tablet Take 2 tablets (650 mg total) by mouth every 4 (four) hours as needed for headache or mild pain. 01/05/15  Yes Erlene Quan, PA-C  aspirin EC 81 MG tablet Take 81 mg by mouth daily.   Yes Historical Provider, MD  cholecalciferol (VITAMIN D) 1000 UNITS tablet Take 1,000 Units by mouth daily.   Yes Historical Provider, MD  clopidogrel (PLAVIX) 75 MG tablet TAKE 1 TABLET (75 MG TOTAL) BY MOUTH DAILY WITH BREAKFAST. 03/17/16  Yes Lorretta Harp, MD  isosorbide mononitrate (IMDUR) 30 MG 24 hr tablet Take 1 tablet (30 mg total) by mouth daily. 04/11/16  Yes Lorretta Harp, MD  nitroGLYCERIN (NITROSTAT) 0.4 MG SL tablet Place 1 tablet (0.4 mg total) under the tongue every 5 (five) minutes  as needed for chest pain. 12/11/14  Yes Lorretta Harp, MD  Polyethyl Glycol-Propyl Glycol (SYSTANE) 0.4-0.3 % SOLN Place 1 drop into both eyes 4 (four) times daily as needed (dry eyes).   Yes Historical Provider, MD  metoprolol succinate (TOPROL XL) 25 MG 24 hr tablet Take 1 tablet (25 mg total) by mouth daily. Patient not taking: Reported on 06/07/2016 12/11/14   Lorretta Harp, MD  pravastatin (PRAVACHOL) 40 MG tablet Take 1 tablet (40 mg total) by mouth every evening. Patient not taking: Reported on 06/07/2016 01/13/15   Lorretta Harp, MD    Current Facility-Administered Medications  Medication Dose Route Frequency Provider Last Rate Last Dose  . 0.9 %  sodium chloride infusion   Intravenous Continuous Shon Millet, DO 100 mL/hr at 06/07/16 2234    . pantoprazole (PROTONIX) 80 mg in sodium chloride 0.9 % 250 mL (0.32 mg/mL) infusion  8 mg/hr Intravenous Continuous Shon Millet, DO 25 mL/hr at 06/07/16 2217 8 mg/hr at 06/07/16 2217  . [START ON 06/11/2016] pantoprazole (PROTONIX) injection 40 mg  40 mg Intravenous Q12H [employer], DO      . pneumococcal 23 valent vaccine (PNU-IMMUNE) injection 0.5 mL  0.5 mL Intramuscular Tomorrow-1000 Shon Millet, DO      . sodium chloride flush (NS) 0.9 % injection 3 mL  3 mL Intravenous Q12H Shon Millet, DO   3 mL at 06/07/16 2130    Allergies as of 06/07/2016  . (No Known Allergies)     Review of Systems:     Constitutional: Positive for weight loss of 20# in past mos, weakness and fatigue  No fever or chills HEENT: Eyes: No change in vision               Ears, Nose, Throat:  No change in hearing or congestion Skin: No rash or itching Cardiovascular: Positive for chest pain No chest pressure or palpitations   Respiratory: No SOB or cough Gastrointestinal: See HPI and otherwise negative Genitourinary: No dysuria or change in urinary frequency Neurological: Positive for dizziness  upon standing No headache or syncope Musculoskeletal: No new muscle or joint pain Hematologic: Positive for episode of "black stool 2 weeks ago No bruising Psychiatric: No history of depression or anxiety   Physical Exam:  Vital signs in last 24 hours: Temp:  [97.8 F (36.6 C)-99.5 F (37.5 C)] 98.6 F (37 C) (10/26 0819) Pulse Rate:  [63-104] 104 (10/26 0819) Resp:  [14-28] 14 (10/26 0819) BP: (96-147)/(65-89) 118/69 (  10/26 0819) SpO2:  [92 %-100 %] 99 % (10/26 0819) Weight:  [104 lb 14.4 oz (47.6 kg)-107 lb 4.8 oz (48.7 kg)] 107 lb 4.8 oz (48.7 kg) (10/26 0649) Last BM Date:  (pt stated it has been about 2 weeks) General:   Pleasant African American male appears to be in NAD, Well developed, thin appearing, alert and cooperative Head:  Normocephalic and atraumatic. Eyes:   PEERL, EOMI. No icterus. Conjunctiva pink. Ears:  Normal auditory acuity. Neck:  Supple Throat: Oral cavity and pharynx without inflammation, swelling or lesion. Poor dentition Lungs: Respirations even and unlabored. Lungs clear to auscultation bilaterally.   No wheezes, crackles, or rhonchi.  Heart: Normal S1, S2. No MRG. Regular rate and rhythm. No peripheral edema, cyanosis or pallor.  Abdomen:  Soft, nondistended,mild ttp epigastrum No rebound or guarding. Normal bowel sounds. No appreciable masses or hepatomegaly. Rectal:  Not performed.  Msk:  Symmetrical without gross deformities.  Extremities:  Without edema, no deformity or joint abnormality. Normal ROM Neurologic:  Alert and  oriented x4;  grossly normal neurologically. CN II-XII intact.  Skin:   Dry and intact without significant lesions or rashes. Psychiatric: Oriented to person, place and time. Demonstrates good judgement and reason without abnormal affect or behaviors.   LAB RESULTS:  Recent Labs  06/07/16 2326 06/08/16 0333 06/08/16 0616  WBC 12.1* 12.1* 12.4*  HGB 7.6* 7.2* 7.3*  HCT 23.8* 22.6* 23.2*  PLT 345 304 304   BMET  Recent  Labs  06/07/16 1257 06/08/16 0616  NA 142 143  K 4.3 3.8  CL 107 113*  CO2 21* 21*  GLUCOSE 135* 99  BUN 22* 14  CREATININE 1.20 0.96  CALCIUM 9.2 8.1*   LFT No results for input(s): PROT, ALBUMIN, AST, ALT, ALKPHOS, BILITOT, BILIDIR, IBILI in the last 72 hours. PT/INR  Recent Labs  06/07/16 1257  LABPROT 14.8  INR 1.15    STUDIES: Dg Chest 2 View  Result Date: 06/07/2016 CLINICAL DATA:  Increased weakness, chest pain increased after eating, difficulty swallowing since discharge from hospital following cardiac stent procedure, hoarse voice, history hypertension, coronary artery disease post MI and stenting, prostate cancer EXAM: CHEST  2 VIEW COMPARISON:  04/04/2016 FINDINGS: Normal heart size post CABG. Mediastinal contours and pulmonary vascularity normal. Atherosclerotic calcification aorta. Lungs emphysematous but clear. No pleural effusion or pneumothorax. Bones unremarkable. IMPRESSION: Post CABG. COPD changes without infiltrate. Aortic atherosclerosis. Electronically Signed   By: Lavonia Dana M.D.   On: 06/07/2016 13:26     PREVIOUS ENDOSCOPIES:            At [facility] in [place], we do not have records   Impression / Plan:  Impression: 1. Dysphagia/Odynophagia: Per chart review this appears to be a chronic problem for the patient, though somewhat worsened lately, patient did have an esophagram in 2012 at [place] which did show motility disorder, over past mos now with liquids and solids, patient also describes some "pain" with swallowing resulting in decreased PO intake and weight loss, no history of recent steroids or inhalers, no signs of oral candida, ?EGD 3-4 years ago at [place]?; Consider esophageal ring vs web vs stricture vs mass vs infection vs GERD 2. Constipation: Patient reports no bowel movement in 2 weeks, along with decreased by mouth intake 3. Weight loss: 20# over past mos, likely due to inactivity and decreased PO intake 4. Anemia: Found to be FOBT positive  in the ER, current hemoglobin 7.3, documented at 13 on 04/27/16,  has remained somewhat stable since time of admission yesterday around noon, he will be given 1 unit of PRBCs today, also vague h/o one episode of "black stool" 2 weeks ago at time of last reported BM 5. Epigastric Pain: patient reports pain from esophagus down to "bottom of my stomach"; Consider relation to GERD vs other 6. H/O CAD: pt s/p CABG x 3 2009 7. PVD: S/p DES placement Sept 2017 8. Prostate Cancer  Plan: 1. Will change diet to clear liquids today and NPO after midnight for EGD tomorrow 2. Continue to hold Plavix 3. Continue to monitor hgb with transfusion <7, will order another unit prbcs to be transfused today per Dr. Loletha Carrow 4. Continue supportive measures 5. Discussed EGD with patient including risks, benefits, limitations and alternatives, he agrees to proceed. Will schedule tomorrow with Dr. Loletha Carrow.  6. Will start Miralax po BID 7. Will send request for records from past endoscopic procedures to [facility] 8. Continue Protonix 40mg  BID 9. Please await further recommendations from Dr. Loletha Carrow  Thank you for your kind consultation, we will continue to follow.  Lavone Nian Lemmon  06/08/2016, 9:01 AM Pager #: [phone]   I have reviewed the entire case in detail with the above APP and discussed the plan in detail.  Therefore, I agree with the diagnoses recorded above. In addition,  I have personally interviewed and examined the patient and have personally reviewed any abdominal/pelvic CT scan images.  My additional thoughts are as follows:  The patient's voice is somewhat hoarse, but he says it is been this way for years.  He has obvious protein calorie malnutrition with temporal wasting, and it is difficult to believe that this 40 pound weight loss is only been within the last month. Certainly looks chronically unwell and is having great difficulty swallowing.  Current plan is for an upper  endoscopy tomorrow, even though he is on dual antiplatelet therapy. I probably could not perform a balloon dilation or similar intervention, but I would like to at least rule out malignancy or something easily treatable like esophageal candidiasis or reflux esophagitis. He is agreeable after thorough discussion of the procedure and its risks in the presence of his nurse.    Nelida Meuse III Pager [phone]  Mon-Fri 8a-5p [phone] after 5p, weekends, holidays

## 2016-06-09 NOTE — Progress Notes (Signed)
Speech Language Pathology Treatment:   Cancellation - pt in endoscopy                 Juan Quam Laurice 06/09/2016, 1:44 PM

## 2016-06-09 NOTE — Progress Notes (Signed)
Dr. Linna Caprice at bedside to assess patient. Patient O2 sats 80% on room air on arrival to recovery, O2 sats remained in low 80s on 6L [place]. Patient placed on non rebreather mask. Sats 94-97%. Patient is responsive to voice. Verbal order recieved for albuterol nebulizer x1 dose now.

## 2016-06-09 NOTE — Op Note (Signed)
[facility] Patient Name: Tony Curtis Procedure Date : 06/09/2016 MRN: [id number] Attending MD: Estill Cotta. Loletha Carrow , MD Date of Birth: 21-Sep-1935 CSN: [id number] Age: 80 Admit Type: Inpatient Procedure:                Upper GI endoscopy Indications:              Iron deficiency anemia secondary to chronic blood                            loss, Dysphagia, Weight loss Providers:                Mallie Mussel L. Loletha Carrow, MD, Vista Lawman, RN, Ralene Bathe,                            Technician, Gershon Crane, CRNA Referring MD:              Medicines:                Monitored Anesthesia Care Complications:            No immediate complications. Estimated Blood Loss:     Estimated blood loss was minimal. Procedure:                Pre-Anesthesia Assessment:                           - Prior to the procedure, a History and Physical                            was performed, and patient medications and                            allergies were reviewed. The patient's tolerance of                            previous anesthesia was also reviewed. The risks                            and benefits of the procedure and the sedation                            options and risks were discussed with the patient.                            All questions were answered, and informed consent                            was obtained. Prior Anticoagulants: The patient has                            taken Plavix (clopidogrel), last dose was 1 day                            prior to procedure. ASA Grade Assessment: III - A  patient with severe systemic disease. After                            reviewing the risks and benefits, the patient was                            deemed in satisfactory condition to undergo the                            procedure.                           After obtaining informed consent, the endoscope was                            passed under direct vision.  Throughout the                            procedure, the patient's blood pressure, pulse, and                            oxygen saturations were monitored continuously. The                            [id number]) scope was introduced through the                            mouth, and advanced to the second part of duodenum.                            The upper GI endoscopy was accomplished without                            difficulty. The patient tolerated the procedure                            well. Scope In: Scope Out: Findings:      The lumen of the esophagus was moderately dilated with no visible       motility.      A large, necrotic, fungating, friable mass with oozing bleeding was       found in the cardia and in the gastric fundus. Biopsies were taken with       a cold forceps for histology. There was a large blood clot in the       stomach as well. Visualization of the remainder of the stomach was       limited due to blood.      The examined duodenum was normal. Impression:               - Dilation in the entire esophagus.                           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 Biopsied.                           - Normal examined duodenum. Moderate Sedation:      MAC sedation used Recommendation:           - Full liquid diet.                           - Await pathology results.                           - Recommend that primary medical team perform                            non-urgent CT scan (computed tomography) of the                            chest and abdomen with contrast when patient has a                            period of continued hemodynamic stability.                           - Continue present medications. Procedure Code(s):        --- Professional ---                           [phone], Esophagogastroduodenoscopy, flexible,                            transoral; with biopsy,  single or multiple Diagnosis Code(s):        --- Professional ---                           K22.8, Other specified diseases of esophagus                           D49.0, Neoplasm of unspecified behavior of                            digestive system                           D50.0, Iron deficiency anemia secondary to blood                            loss (chronic)                           R13.10, Dysphagia, unspecified                           R63.4, Abnormal weight loss CPT copyright 2016 American Medical Association. All rights reserved. The codes documented in this report are preliminary and upon coder review may  be revised to meet current compliance requirements. Aurianna Earlywine L. Loletha Carrow, MD 06/09/2016 1:29:13 PM This report has been signed electronically. Number  of Addenda: 0

## 2016-06-09 NOTE — Interval H&P Note (Signed)
History and Physical Interval Note:  06/09/2016 12:59 PM  Tony Curtis  has presented today for surgery, with the diagnosis of Dysphagia, Anemia  The various methods of treatment have been discussed with the patient and family. After consideration of risks, benefits and other options for treatment, the patient has consented to  Procedure(s): ESOPHAGOGASTRODUODENOSCOPY (EGD) WITH PROPOFOL (N/A) as a surgical intervention .  The patient's history has been reviewed, patient examined, no change in status, stable for surgery.  I have reviewed the patient's chart and labs.  Questions were answered to the patient's satisfaction.     Nelida Meuse III

## 2016-06-09 NOTE — Anesthesia Preprocedure Evaluation (Signed)
Anesthesia Evaluation  Patient identified by MRN, date of birth, ID band Patient awake    Reviewed: Allergy & Precautions, NPO status , Patient's Chart, lab work & pertinent test results  Airway Mallampati: II  TM Distance: >3 FB     Dental  (+) Poor Dentition   Pulmonary former smoker,     + decreased breath sounds      Cardiovascular hypertension,  Rhythm:Regular Rate:Normal     Neuro/Psych    GI/Hepatic   Endo/Other    Renal/GU      Musculoskeletal   Abdominal   Peds  Hematology   Anesthesia Other Findings   Reproductive/Obstetrics                             Anesthesia Physical Anesthesia Plan  ASA: III  Anesthesia Plan: MAC   Post-op Pain Management:    Induction: Intravenous  Airway Management Planned: Nasal Cannula and Natural Airway  Additional Equipment:   Intra-op Plan:   Post-operative Plan:   Informed Consent: I have reviewed the patients History and Physical, chart, labs and discussed the procedure including the risks, benefits and alternatives for the proposed anesthesia with the patient or authorized representative who has indicated his/her understanding and acceptance.   Dental advisory given  Plan Discussed with: CRNA and Anesthesiologist  Anesthesia Plan Comments:         Anesthesia Quick Evaluation

## 2016-06-10 ENCOUNTER — Encounter (HOSPITAL_COMMUNITY): Payer: Self-pay | Admitting: Radiology

## 2016-06-10 ENCOUNTER — Observation Stay (HOSPITAL_COMMUNITY): Payer: Medicare Other

## 2016-06-10 DIAGNOSIS — K921 Melena: Secondary | ICD-10-CM | POA: Diagnosis present

## 2016-06-10 DIAGNOSIS — A419 Sepsis, unspecified organism: Secondary | ICD-10-CM | POA: Diagnosis present

## 2016-06-10 DIAGNOSIS — D649 Anemia, unspecified: Secondary | ICD-10-CM | POA: Diagnosis present

## 2016-06-10 DIAGNOSIS — Z951 Presence of aortocoronary bypass graft: Secondary | ICD-10-CM | POA: Diagnosis not present

## 2016-06-10 DIAGNOSIS — E860 Dehydration: Secondary | ICD-10-CM | POA: Diagnosis not present

## 2016-06-10 DIAGNOSIS — J690 Pneumonitis due to inhalation of food and vomit: Secondary | ICD-10-CM | POA: Diagnosis present

## 2016-06-10 DIAGNOSIS — D62 Acute posthemorrhagic anemia: Secondary | ICD-10-CM | POA: Diagnosis present

## 2016-06-10 DIAGNOSIS — K319 Disease of stomach and duodenum, unspecified: Secondary | ICD-10-CM | POA: Diagnosis not present

## 2016-06-10 DIAGNOSIS — J189 Pneumonia, unspecified organism: Secondary | ICD-10-CM | POA: Diagnosis not present

## 2016-06-10 DIAGNOSIS — I10 Essential (primary) hypertension: Secondary | ICD-10-CM | POA: Diagnosis present

## 2016-06-10 DIAGNOSIS — Z681 Body mass index (BMI) 19 or less, adult: Secondary | ICD-10-CM | POA: Diagnosis not present

## 2016-06-10 DIAGNOSIS — R627 Adult failure to thrive: Secondary | ICD-10-CM | POA: Diagnosis present

## 2016-06-10 DIAGNOSIS — K567 Ileus, unspecified: Secondary | ICD-10-CM | POA: Diagnosis present

## 2016-06-10 DIAGNOSIS — Z515 Encounter for palliative care: Secondary | ICD-10-CM | POA: Diagnosis present

## 2016-06-10 DIAGNOSIS — K922 Gastrointestinal hemorrhage, unspecified: Secondary | ICD-10-CM | POA: Diagnosis present

## 2016-06-10 DIAGNOSIS — E876 Hypokalemia: Secondary | ICD-10-CM | POA: Diagnosis present

## 2016-06-10 DIAGNOSIS — R0902 Hypoxemia: Secondary | ICD-10-CM | POA: Diagnosis present

## 2016-06-10 DIAGNOSIS — R1310 Dysphagia, unspecified: Secondary | ICD-10-CM | POA: Diagnosis not present

## 2016-06-10 DIAGNOSIS — I252 Old myocardial infarction: Secondary | ICD-10-CM | POA: Diagnosis not present

## 2016-06-10 DIAGNOSIS — C160 Malignant neoplasm of cardia: Secondary | ICD-10-CM | POA: Diagnosis present

## 2016-06-10 DIAGNOSIS — E785 Hyperlipidemia, unspecified: Secondary | ICD-10-CM | POA: Diagnosis present

## 2016-06-10 DIAGNOSIS — C787 Secondary malignant neoplasm of liver and intrahepatic bile duct: Secondary | ICD-10-CM | POA: Diagnosis present

## 2016-06-10 DIAGNOSIS — Z23 Encounter for immunization: Secondary | ICD-10-CM | POA: Diagnosis not present

## 2016-06-10 DIAGNOSIS — C801 Malignant (primary) neoplasm, unspecified: Secondary | ICD-10-CM

## 2016-06-10 DIAGNOSIS — Z809 Family history of malignant neoplasm, unspecified: Secondary | ICD-10-CM | POA: Diagnosis not present

## 2016-06-10 DIAGNOSIS — N179 Acute kidney failure, unspecified: Secondary | ICD-10-CM | POA: Diagnosis present

## 2016-06-10 DIAGNOSIS — I739 Peripheral vascular disease, unspecified: Secondary | ICD-10-CM | POA: Diagnosis not present

## 2016-06-10 DIAGNOSIS — Z87891 Personal history of nicotine dependence: Secondary | ICD-10-CM | POA: Diagnosis not present

## 2016-06-10 DIAGNOSIS — Z8546 Personal history of malignant neoplasm of prostate: Secondary | ICD-10-CM | POA: Diagnosis not present

## 2016-06-10 DIAGNOSIS — K219 Gastro-esophageal reflux disease without esophagitis: Secondary | ICD-10-CM | POA: Diagnosis present

## 2016-06-10 DIAGNOSIS — C259 Malignant neoplasm of pancreas, unspecified: Secondary | ICD-10-CM | POA: Diagnosis not present

## 2016-06-10 LAB — CBC WITH DIFFERENTIAL/PLATELET
BASOS PCT: 0 %
Basophils Absolute: 0 10*3/uL (ref 0.0–0.1)
EOS ABS: 0 10*3/uL (ref 0.0–0.7)
EOS PCT: 0 %
HEMATOCRIT: 28.6 % — AB (ref 39.0–52.0)
Hemoglobin: 9.1 g/dL — ABNORMAL LOW (ref 13.0–17.0)
LYMPHS ABS: 1.4 10*3/uL (ref 0.7–4.0)
LYMPHS PCT: 7 %
MCH: 27.1 pg (ref 26.0–34.0)
MCHC: 31.8 g/dL (ref 30.0–36.0)
MCV: 85.1 fL (ref 78.0–100.0)
MONO ABS: 1.2 10*3/uL — AB (ref 0.1–1.0)
Monocytes Relative: 6 %
NEUTROS PCT: 87 %
Neutro Abs: 17.6 10*3/uL — ABNORMAL HIGH (ref 1.7–7.7)
PLATELETS: 301 10*3/uL (ref 150–400)
RBC: 3.36 MIL/uL — ABNORMAL LOW (ref 4.22–5.81)
RDW: 16.6 % — AB (ref 11.5–15.5)
WBC: 20.2 10*3/uL — AB (ref 4.0–10.5)

## 2016-06-10 LAB — BASIC METABOLIC PANEL
Anion gap: 7 (ref 5–15)
BUN: 8 mg/dL (ref 6–20)
CALCIUM: 7.7 mg/dL — AB (ref 8.9–10.3)
CO2: 22 mmol/L (ref 22–32)
CREATININE: 0.97 mg/dL (ref 0.61–1.24)
Chloride: 111 mmol/L (ref 101–111)
GFR calc Af Amer: >60 mL/min (ref >60)
GLUCOSE: 167 mg/dL — AB (ref 65–99)
Potassium: 3.3 mmol/L — ABNORMAL LOW (ref 3.5–5.1)
Sodium: 140 mmol/L (ref 135–145)

## 2016-06-10 MED ORDER — IOPAMIDOL (ISOVUE-300) INJECTION 61%: 15.0000 mL | INTRAVENOUS | Status: AC

## 2016-06-10 MED ORDER — PIPERACILLIN-TAZOBACTAM 3.375 G IVPB
3.3750 g | Freq: Three times a day (TID) | INTRAVENOUS | Status: DC
  Administered 2016-06-10 – 2016-06-14 (×12): 3.375 g via INTRAVENOUS
  Filled 2016-06-10 (×14): qty 50

## 2016-06-10 MED ORDER — IOPAMIDOL (ISOVUE-300) INJECTION 61%
INTRAVENOUS | Status: AC
  Administered 2016-06-10: 100 mL
  Filled 2016-06-10: qty 100

## 2016-06-10 MED ORDER — MORPHINE SULFATE (PF) 2 MG/ML IV SOLN
2.0000 mg | INTRAVENOUS | Status: DC | PRN
  Administered 2016-06-11 (×3): 2 mg via INTRAVENOUS
  Filled 2016-06-10 (×3): qty 1

## 2016-06-10 MED ORDER — POTASSIUM CHLORIDE 20 MEQ PO PACK
40.0000 meq | PACK | Freq: Once | ORAL | Status: AC
  Administered 2016-06-10: 40 meq via ORAL
  Filled 2016-06-10: qty 2

## 2016-06-10 NOTE — Progress Notes (Signed)
Daily Rounding Note  06/10/2016, 8:21 AM  LOS: 0 days   SUBJECTIVE:   Chief complaint:  Dysphagia   Pain in abdomen persists.  Unable to eat/drink much.  Has hiccups which exacerbate pain.  Has not been getting any of the prn Morphine, RN will give it to him now.     OBJECTIVE:         Vital signs in last 24 hours:    Temp:  [97.3 F (36.3 C)-102.8 F (39.3 C)] 98.4 F (36.9 C) (10/28 0628) Pulse Rate:  [110-128] 110 (10/28 0628) Resp:  [20-37] 21 (10/28 0628) BP: (118-159)/(62-86) 124/74 (10/28 0628) SpO2:  [80 %-100 %] 99 % (10/28 0628) Weight:  [50.3 kg (111 lb)-50.8 kg (112 lb)] 50.3 kg (111 lb) (10/28 0628) Last BM Date:  (unknown) Filed Weights   06/09/16 [id number] 06/09/16 1106 06/10/16 0628  Weight: 51.2 kg (112 lb 12.8 oz) 50.8 kg (112 lb) 50.3 kg (111 lb)   General: thin, ill looking   Heart: Reg, tachy. Chest: slight dyspnea.  Lungs clear but BS diminished.  Abdomen: soft, tender across entire upper and waist level belly.  BS hypoactive.    Extremities: no CCE Neuro/Psych:  Cooperative. Alert.  Oriented x 3.  No gross deficits.   Intake/Output from previous day: 10/27 0701 - 10/28 0700 In: [id number] [P.O.:477; I.V.:300] Out: 300 [Urine:300]  Intake/Output this shift: No intake/output data recorded.  Lab Results:  Recent Labs  06/08/16 1614 06/09/16 0337 06/10/16 0259  WBC 15.4* 16.1* 20.2*  HGB 9.5* 9.0* 9.1*  HCT 29.9* 28.2* 28.6*  PLT 294 286 301   BMET  Recent Labs  06/08/16 0616 06/09/16 0337 06/10/16 0259  NA 143 140 140  K 3.8 3.1* 3.3*  CL 113* 114* 111  CO2 21* 21* 22  GLUCOSE 99 139* 167*  BUN 14 9 8   CREATININE 0.96 0.85 0.97  CALCIUM 8.1* 7.8* 7.7*   LFT No results for input(s): PROT, ALBUMIN, AST, ALT, ALKPHOS, BILITOT, BILIDIR, IBILI in the last 72 hours. PT/INR  Recent Labs  06/07/16 1257  LABPROT 14.8  INR 1.15   Hepatitis Panel No results for input(s):  HEPBSAG, HCVAB, HEPAIGM, HEPBIGM in the last 72 hours.  Studies/Results: No results found.  ASSESMENT:   *  Blood loss IDA.  Dysphagia.  Weight loss. Abdominal pain.  10/27 EGD: gastric cardia/fundus tumor looks malignant.  Path pending.   *  Chronic Plavix for ASPVD.  Recent LE DES in 04/2016   PLAN   *  Staging CT chest, abdomen.  Timing per hospitalist.  Not yet ordered but doubt he culd tolerated oral contrast at present.   *  Liquid to very soft diet. Leave on fulls for now with added Ensure.     Azucena Freed  06/10/2016, 8:21 AM Pager: [phone]  I have discussed the case with the PA, and that is the plan I formulated. I personally interviewed and examined the patient.   Add'l Dx: Weight loss Malignant-appearing gastric mass  His plan is in place for imaging chest/abdomen this weekend per medicine team, and when Bx return he will need oncology consult if malignancy confirmed.  Treatment would need to begin in earnest because the mass is causing an achalasia-like picture of obstruction because of it's involvement at the EGJ.  He has a recent leg stent.  I think aspirin can be started in 2 days, but do not resume plavix because it was causing large  volume blood loss from this friable tumor.   We cannot be of much more help at this juncture, so we will sign off and you may call as needed. Thanks for involving [place] in his care.  Nelida Meuse III Pager [phone]  Mon-Fri 8a-5p [phone] after 5p, weekends, holidays

## 2016-06-10 NOTE — Anesthesia Postprocedure Evaluation (Signed)
Anesthesia Post Note  Patient: Tony Curtis  Procedure(s) Performed: Procedure(s) (LRB): ESOPHAGOGASTRODUODENOSCOPY (EGD) WITH PROPOFOL (N/A)  Patient location during evaluation: Endoscopy Anesthesia Type: MAC Level of consciousness: awake and awake and alert Pain management: pain level controlled Vital Signs Assessment: post-procedure vital signs reviewed and stable Respiratory status: spontaneous breathing, nonlabored ventilation and respiratory function stable Cardiovascular status: blood pressure returned to baseline Anesthetic complications: no    Last Vitals:  Vitals:   06/10/16 0108 06/10/16 0628  BP: 118/67 124/74  Pulse: (!) 118 (!) 110  Resp: 20 (!) 21  Temp: 37.1 C 36.9 C    Last Pain:  Vitals:   06/10/16 0905  TempSrc:   PainSc: 4                  Jarmel Linhardt COKER

## 2016-06-10 NOTE — Progress Notes (Addendum)
PROGRESS NOTE    Tony Curtis  [id number] DOB: 02/22/1936 DOA: 06/07/2016 PCP: Pcp Not In System     Brief Narrative:  80 yo male with cad and pvd, sp stent on right lower extremity, on plavix, presents with dysphagia and odinophagia. Found with severe anemia and FOBT positive   Assessment & Plan:   Principal Problem:   Symptomatic anemia Active Problems:   CAD, CABG X 3 9/09. Cath 2012 and 02/05/13- 2/3 grafts occluded- medical Rx   PVD - Rt SFA PTA 8/112   Dehydration   Esophageal dysphagia   Odynophagia   1. Acute blood loss anemia due to upper GI bleed. Will continue to hold antiplatelet therapy for now, gastric mass source of bleeding, total 2 units prbc transfused. Hb and Hct 9,1 and 28.   2. Malignant gastric tumor . Noted a large, necrotic, fungating, friable mass with oozing bleeding per endoscopy. Will continue work up with ct chest and abdomen. Will continue pain control with morphine, supportive IV fluids, antiemetics and antiacids. Poor oral intake due to odynophagia.   3. Dehydration with hypokalemia. Will continue to replete K with kcl, follow renal panel in am, serum K up to 3,3. Renal function with normal cr. Cl at 111 and serum bicarbonate 22, cw non gap metabolic acidosis. Continue d51/2 saline at 75 cc.hr with 20 kcl.   4. PVD status post stent on right lower extremity. Old records personally reviewed, stent placed last month, drug eluding stent/ balloon angioplasty. Will continue to hold antiplatelet therapy due to high risk of re-bleeding, per GI may be able to resume asa.   5. HTN.Continue blood pressure control, continue metoprolol.    Late entry: CT chest noted infiltrate on the left lower lobe, possible aspiration, patient will be started on Zosyn and will order blood cultures.   DVT prophylaxis:scd Code Status:full  Family Communication: I spoke with family at the bedside and all questions were addressed. Disposition  Plan:HOme  Consultants:   Gastroenterology   Procedures:   EGD on 10/27 with larger gastric mass.   Antimicrobials:     Subjective: Patient had a fever last this am, significant odynophagia, 10/10 intensity, no radiation, sharp in nature, improved with IV  Morphine, associated with decease po intake.   Objective: Vitals:   06/09/16 1730 06/09/16 2114 06/10/16 0108 06/10/16 0628  BP:  124/69 118/67 124/74  Pulse: (!) 120 (!) 128 (!) 118 (!) 110  Resp:  20 20 (!) 21  Temp:  (!) 102.8 F (39.3 C) 98.7 F (37.1 C) 98.4 F (36.9 C)  TempSrc:  Oral Oral Oral  SpO2: 100% 98% 97% 99%  Weight:    50.3 kg (111 lb)  Height:        Intake/Output Summary (Last 24 hours) at 06/10/16 1051 Last data filed at 06/10/16 0917  Gross per 24 hour  Intake              997 ml  Output              500 ml  Net              497 ml   Filed Weights   06/09/16 0638 06/09/16 1106 06/10/16 0628  Weight: 51.2 kg (112 lb 12.8 oz) 50.8 kg (112 lb) 50.3 kg (111 lb)    Examination:  General exam: deconditioned and ill looking appearing.  E ENT: positive conjunctival pallor, oral mucosa dry.  Respiratory system: Mild rales at bases, no wheezing or  rhonchi. Respiratory effort normal. Cardiovascular system: S1 & S2 heard, RRR. No JVD, murmurs, rubs, gallops or clicks. No pedal edema. Gastrointestinal system: Abdomen is nondistended, soft and nontender. No organomegaly or masses felt. Normal bowel sounds heard. Central nervous system: Alert and oriented. No focal neurological deficits. Extremities: Symmetric 5 x 5 power. Skin: No rashes, lesions or ulcers    Data Reviewed: I have personally reviewed following labs and imaging studies  CBC:  Recent Labs Lab 06/08/16 0333 06/08/16 0616 06/08/16 1614 06/09/16 0337 06/10/16 0259  WBC 12.1* 12.4* 15.4* 16.1* 20.2*  NEUTROABS  --   --   --  13.3* 17.6*  HGB 7.2* 7.3* 9.5* 9.0* 9.1*  HCT 22.6* 23.2* 29.9* 28.2* 28.6*  MCV 85.0 85.3 85.7 84.4  85.1  PLT 304 304 294 286 [id number]   Basic Metabolic Panel:  Recent Labs Lab 06/07/16 1257 06/08/16 0616 06/09/16 0337 06/10/16 0259  NA 142 143 140 140  K 4.3 3.8 3.1* 3.3*  CL 107 113* 114* 111  CO2 21* 21* 21* 22  GLUCOSE 135* 99 139* 167*  BUN 22* 14 9 8   CREATININE 1.20 0.96 0.85 0.97  CALCIUM 9.2 8.1* 7.8* 7.7*   GFR: Estimated Creatinine Clearance: 44.7 mL/min (by C-G formula based on SCr of 0.97 mg/dL). Liver Function Tests: No results for input(s): AST, ALT, ALKPHOS, BILITOT, PROT, ALBUMIN in the last 168 hours. No results for input(s): LIPASE, AMYLASE in the last 168 hours. No results for input(s): AMMONIA in the last 168 hours. Coagulation Profile:  Recent Labs Lab 06/07/16 1257  INR 1.15   Cardiac Enzymes: No results for input(s): CKTOTAL, CKMB, CKMBINDEX, TROPONINI in the last 168 hours. BNP (last 3 results) No results for input(s): PROBNP in the last 8760 hours. HbA1C: No results for input(s): HGBA1C in the last 72 hours. CBG: No results for input(s): GLUCAP in the last 168 hours. Lipid Profile: No results for input(s): CHOL, HDL, LDLCALC, TRIG, CHOLHDL, LDLDIRECT in the last 72 hours. Thyroid Function Tests: No results for input(s): TSH, T4TOTAL, FREET4, T3FREE, THYROIDAB in the last 72 hours. Anemia Panel: No results for input(s): VITAMINB12, FOLATE, FERRITIN, TIBC, IRON, RETICCTPCT in the last 72 hours. Sepsis Labs: No results for input(s): PROCALCITON, LATICACIDVEN in the last 168 hours.  Recent Results (from the past 240 hour(s))  Culture, blood (Routine X 2) w Reflex to ID Panel     Status: None (Preliminary result)   Collection Time: 06/08/16 10:45 PM  Result Value Ref Range Status   Specimen Description BLOOD RIGHT ARM  Final   Special Requests BOTTLES DRAWN AEROBIC AND ANAEROBIC 5ML  Final   Culture NO GROWTH < 24 HOURS  Final   Report Status PENDING  Incomplete  Culture, blood (Routine X 2) w Reflex to ID Panel     Status: None (Preliminary  result)   Collection Time: 06/08/16 10:49 PM  Result Value Ref Range Status   Specimen Description BLOOD LEFT HAND  Final   Special Requests AEROBIC BOTTLE ONLY 5ML  Final   Culture NO GROWTH < 24 HOURS  Final   Report Status PENDING  Incomplete         Radiology Studies: No results found.      Scheduled Meds: . feeding supplement (ENSURE ENLIVE)  237 mL Oral TID BM  . iopamidol  15 mL Oral Q1 Hr x 2  . metoprolol succinate  25 mg Oral Daily  . multivitamin  15 mL Oral Daily  . [START ON 06/11/2016] pantoprazole  40 mg Intravenous Q12H  . pneumococcal 23 valent vaccine  0.5 mL Intramuscular Tomorrow-1000  . polyethylene glycol  17 g Oral BID  . potassium chloride  40 mEq Oral Once  . sodium chloride flush  3 mL Intravenous Q12H   Continuous Infusions: . dextrose 5 % with KCl 20 mEq / L 20 mEq (06/10/16 0723)  . pantoprozole (PROTONIX) infusion 8 mg/hr (06/10/16 0723)     LOS: 0 days        Alexavier Tsutsui Gerome Apley, MD [facility] Pager [phone]  If 7PM-7AM, please contact night-coverage [email or web address] Password [email or web address] 06/10/2016, 10:51 AM

## 2016-06-11 ENCOUNTER — Inpatient Hospital Stay (HOSPITAL_COMMUNITY): Payer: Medicare Other

## 2016-06-11 ENCOUNTER — Encounter (HOSPITAL_COMMUNITY): Payer: Self-pay | Admitting: Gastroenterology

## 2016-06-11 LAB — CBC WITH DIFFERENTIAL/PLATELET
BASOS ABS: 0 10*3/uL (ref 0.0–0.1)
BASOS PCT: 0 %
EOS ABS: 0.1 10*3/uL (ref 0.0–0.7)
EOS PCT: 0 %
HEMATOCRIT: 30.3 % — AB (ref 39.0–52.0)
Hemoglobin: 9.6 g/dL — ABNORMAL LOW (ref 13.0–17.0)
Lymphocytes Relative: 9 %
Lymphs Abs: 1.7 10*3/uL (ref 0.7–4.0)
MCH: 26.7 pg (ref 26.0–34.0)
MCHC: 31.7 g/dL (ref 30.0–36.0)
MCV: 84.2 fL (ref 78.0–100.0)
MONO ABS: 1.5 10*3/uL — AB (ref 0.1–1.0)
Monocytes Relative: 8 %
NEUTROS ABS: 16.2 10*3/uL — AB (ref 1.7–7.7)
Neutrophils Relative %: 83 %
PLATELETS: 325 10*3/uL (ref 150–400)
RBC: 3.6 MIL/uL — ABNORMAL LOW (ref 4.22–5.81)
RDW: 16.5 % — AB (ref 11.5–15.5)
WBC: 19.4 10*3/uL — ABNORMAL HIGH (ref 4.0–10.5)

## 2016-06-11 LAB — BASIC METABOLIC PANEL
ANION GAP: 7 (ref 5–15)
BUN: 7 mg/dL (ref 6–20)
CALCIUM: 8.1 mg/dL — AB (ref 8.9–10.3)
CO2: 21 mmol/L — ABNORMAL LOW (ref 22–32)
CREATININE: 0.94 mg/dL (ref 0.61–1.24)
Chloride: 109 mmol/L (ref 101–111)
GLUCOSE: 116 mg/dL — AB (ref 65–99)
Potassium: 4.1 mmol/L (ref 3.5–5.1)
Sodium: 137 mmol/L (ref 135–145)

## 2016-06-11 MED ORDER — DEXTROSE IN LACTATED RINGERS 5 % IV SOLN
INTRAVENOUS | Status: DC
  Administered 2016-06-11 – 2016-06-12 (×2): via INTRAVENOUS

## 2016-06-11 MED ORDER — HYDROMORPHONE HCL 1 MG/ML IJ SOLN
INTRAMUSCULAR | Status: AC
Start: 2016-06-11 — End: 2016-06-11
  Administered 2016-06-11: 0.5 mg via INTRAVENOUS
  Filled 2016-06-11: qty 1

## 2016-06-11 MED ORDER — HYDROMORPHONE HCL 1 MG/ML IJ SOLN
1.0000 mg | INTRAMUSCULAR | Status: DC | PRN
  Administered 2016-06-11 – 2016-06-13 (×5): 1 mg via INTRAVENOUS
  Filled 2016-06-11 (×5): qty 1

## 2016-06-11 MED ORDER — FENTANYL CITRATE (PF) 100 MCG/2ML IJ SOLN
25.0000 ug | INTRAMUSCULAR | Status: DC | PRN
  Administered 2016-06-11: 25 ug via INTRAVENOUS
  Filled 2016-06-11: qty 2

## 2016-06-11 MED ORDER — HYDROMORPHONE HCL 1 MG/ML IJ SOLN
1.0000 mg | Freq: Once | INTRAMUSCULAR | Status: AC
  Administered 2016-06-11: 1 mg via INTRAVENOUS
  Filled 2016-06-11: qty 1

## 2016-06-11 MED ORDER — HYDROMORPHONE HCL 1 MG/ML IJ SOLN
0.5000 mg | Freq: Once | INTRAMUSCULAR | Status: AC
  Administered 2016-06-11: 0.5 mg via INTRAVENOUS

## 2016-06-11 NOTE — Progress Notes (Addendum)
PROGRESS NOTE    Tony Curtis  [id number] DOB: Feb 22, 1936 DOA: 06/07/2016 PCP: Pcp Not In System    Brief Narrative:  80 yo male with cad and pvd, sp stent on right lower extremity, on asa and plavix, presents with dysphagia and odinophagia. Found with severe anemia and FOBT positive. Gastric fungating bleeding mass found on endoscopy, CT with liver mets. Developed sepsis due to aspiration pneumonia, suspected present on admission.    Assessment & Plan:   Principal Problem:   Symptomatic anemia Active Problems:   CAD, CABG X 3 9/09. Cath 2012 and 02/05/13- 2/3 grafts occluded- medical Rx   PVD - Rt SFA PTA 8/112   Dehydration   Esophageal dysphagia   Odynophagia   Malignancy (HCC)   Anemia    1. Acute blood loss anemia due to upper GI bleed. will plan to resume aspirin in am per GI recommendations, hb and hct at 9.6 and 30.3. No signs of active bleeding. Will continue to monitor cell count.    2. Malignant gastric tumor . Large, necrotic, fungating, friable mass with oozing at the gastric cardia and fundus. Persistent abdominal and esophageal pain, will change morphine to fentanyl, noted multiple liver metastatic lesions.    3. Dehydration with hypokalemia. K has been corrected, will change fluids to D5LR at 75 cc/ hour, will follow on renal function in am, avoid hypotension or nephrotoxic medications.  4. PVD status post stent on right lower extremity. Old records personally reviewed, stent placed last month, drug eluding stent/ balloon angioplasty. Will plan to resume asa in am. For now will continue to hold on plavix sue to very high risk of bleeding.  5. HTN. Blood pressure systolic in the [id number] to [id number].   6. Sepsis, due to aspiration pneumonia, suspected to be present on admission.  T max 100,3, will continue antibiotic therapy with IV zosyn, aspiration precautions, continue supplemental 02 per [place], keep 02 sat above 92%. Possible left lower lobe abscess,  1.9 cm  at the right lower lobe. Personally reviewed ct chest lung windows and evident infiltrate on the left base.   Late entry: patient with significant abdominal pain, abdominal films with developing ileus. Will continue conservative care, if vomiting, will placed a NG tube, case discussed with Dr Loletha Carrow, from GI.   DVT prophylaxis:scd Code Status:full  Family Communication: I spoke with family at the bedside and all questions were addressed. Disposition Plan:HOme  Consultants:   Gastroenterology   Procedures:   EGD on 10/27 with larger gastric mass.  Antimicrobials:   Zosyn #1   Subjective: Patient with persistent sever pain in the epigastric region, no worsening factors and no improving factors, no nausea or vomiting, no dyspnea.   Objective: Vitals:   06/10/16 1158 06/10/16 2031 06/11/16 0623 06/11/16 1057  BP: 121/65 119/67 106/63 104/69  Pulse: (!) 114 (!) 113 (!) 122 (!) 125  Resp: 18 18 18 18   Temp: 98.3 F (36.8 C) 100.3 F (37.9 C) 99.1 F (37.3 C) 98 F (36.7 C)  TempSrc: Oral Oral Oral Oral  SpO2: 98% 97% 96% 95%  Weight:   53.8 kg (118 lb 8 oz)   Height:        Intake/Output Summary (Last 24 hours) at 06/11/16 1112 Last data filed at 06/11/16 0900  Gross per 24 hour  Intake              460 ml  Output  550 ml  Net              -90 ml   Filed Weights   06/09/16 1106 06/10/16 0628 06/11/16 0623  Weight: 50.8 kg (112 lb) 50.3 kg (111 lb) 53.8 kg (118 lb 8 oz)    Examination:  General exam: ill looking appearing, deconditioned, in pain and dyspnea E ENT: positive conjunctival pallor and mild icterus, oral mucosa dry. Respiratory system: bibasilar rales more left than right, poor inspiratory effort, no rhonchi no wheezing. Cardiovascular system: S1 & S2 heard, RRR. No JVD, murmurs, rubs, gallops or clicks. No pedal edema. Gastrointestinal system: Abdomen with mild distention, tender to superficial palpation, no rebound, decreased bowle  sounds. No organomegaly or masses felt. Normal bowel sounds heard. Central nervous system: Alert and oriented. No focal neurological deficits. Extremities: Symmetric 5 x 5 power. Skin: No rashes, lesions or ulcers     Data Reviewed: I have personally reviewed following labs and imaging studies  CBC:  Recent Labs Lab 06/08/16 0616 06/08/16 1614 06/09/16 0337 06/10/16 0259 06/11/16 0435  WBC 12.4* 15.4* 16.1* 20.2* 19.4*  NEUTROABS  --   --  13.3* 17.6* 16.2*  HGB 7.3* 9.5* 9.0* 9.1* 9.6*  HCT 23.2* 29.9* 28.2* 28.6* 30.3*  MCV 85.3 85.7 84.4 85.1 84.2  PLT 304 294 286 301 [id number]   Basic Metabolic Panel:  Recent Labs Lab 06/07/16 1257 06/08/16 0616 06/09/16 0337 06/10/16 0259 06/11/16 0435  NA 142 143 140 140 137  K 4.3 3.8 3.1* 3.3* 4.1  CL 107 113* 114* 111 109  CO2 21* 21* 21* 22 21*  GLUCOSE 135* 99 139* 167* 116*  BUN 22* 14 9 8 7   CREATININE 1.20 0.96 0.85 0.97 0.94  CALCIUM 9.2 8.1* 7.8* 7.7* 8.1*   GFR: Estimated Creatinine Clearance: 49.3 mL/min (by C-G formula based on SCr of 0.94 mg/dL). Liver Function Tests: No results for input(s): AST, ALT, ALKPHOS, BILITOT, PROT, ALBUMIN in the last 168 hours. No results for input(s): LIPASE, AMYLASE in the last 168 hours. No results for input(s): AMMONIA in the last 168 hours. Coagulation Profile:  Recent Labs Lab 06/07/16 1257  INR 1.15   Cardiac Enzymes: No results for input(s): CKTOTAL, CKMB, CKMBINDEX, TROPONINI in the last 168 hours. BNP (last 3 results) No results for input(s): PROBNP in the last 8760 hours. HbA1C: No results for input(s): HGBA1C in the last 72 hours. CBG: No results for input(s): GLUCAP in the last 168 hours. Lipid Profile: No results for input(s): CHOL, HDL, LDLCALC, TRIG, CHOLHDL, LDLDIRECT in the last 72 hours. Thyroid Function Tests: No results for input(s): TSH, T4TOTAL, FREET4, T3FREE, THYROIDAB in the last 72 hours. Anemia Panel: No results for input(s): VITAMINB12,  FOLATE, FERRITIN, TIBC, IRON, RETICCTPCT in the last 72 hours. Sepsis Labs: No results for input(s): PROCALCITON, LATICACIDVEN in the last 168 hours.  Recent Results (from the past 240 hour(s))  Culture, blood (Routine X 2) w Reflex to ID Panel     Status: None (Preliminary result)   Collection Time: 06/08/16 10:45 PM  Result Value Ref Range Status   Specimen Description BLOOD RIGHT ARM  Final   Special Requests BOTTLES DRAWN AEROBIC AND ANAEROBIC 5ML  Final   Culture NO GROWTH 2 DAYS  Final   Report Status PENDING  Incomplete  Culture, blood (Routine X 2) w Reflex to ID Panel     Status: None (Preliminary result)   Collection Time: 06/08/16 10:49 PM  Result Value Ref Range Status  Specimen Description BLOOD LEFT HAND  Final   Special Requests AEROBIC BOTTLE ONLY 5ML  Final   Culture NO GROWTH 2 DAYS  Final   Report Status PENDING  Incomplete         Radiology Studies: Ct Chest W Contrast  Result Date: 06/10/2016 CLINICAL DATA:  Malignant appearing gastric mass on upper endoscopy, dysphasia, weight loss, iron deficiency anemia EXAM: CT CHEST AND ABDOMEN WITH CONTRAST TECHNIQUE: Multidetector CT imaging of the chest and abdomen was performed following the standard protocol during bolus administration of intravenous contrast. CONTRAST:  100 mL Isovue 300 IV COMPARISON:  CTA abdomen/ pelvis dated 08/28/2014 FINDINGS: CT CHEST FINDINGS Cardiovascular: Heart is top-normal in size. No pericardial effusion. Coronary atherosclerosis in the LAD. Postsurgical changes related to prior CABG. Mild atherosclerotic calcifications of the aortic arch. Mediastinum/Nodes: Small mediastinal lymph nodes, including a 7 mm short axis low right paratracheal node (series 2/ image 23) and an 8 mm short axis AP window node (series 2/ image 22), within the upper limits of normal. 9 mm short axis lower paraesophageal node (series 2/image 47), suspicious. Visualized thyroid is unremarkable. Fluid within the  mid/distal esophagus. Lungs/Pleura: Patchy opacities in the lingula and bilateral lower lobes, left lower lobe predominant, suspicious for pneumonia. Superimposed atelectasis is possible. Associated 1.9 cm cavitary lesion in the right lower lobe (series 2/ image 41), likely reflecting an associated pulmonary abscess. Small bilateral pleural effusions. No suspicious pulmonary nodules. Underlying mild centrilobular and paraseptal emphysematous changes. No pneumothorax. Musculoskeletal: Stable bone island in the anterior T8 vertebral body (sagittal image 67). Median sternotomy. CT ABDOMEN FINDINGS Hepatobiliary: Multifocal hepatic metastases, at least 8 in number, most of which are in the right hepatic lobe. Dominant 3.1 cm metastasis in segment 7 (series 2/ image 48). A 2.0 cm metastasis is present in segment 2 (series 2/image 58). Gallbladder is unremarkable. No intrahepatic or extrahepatic ductal dilatation. Pancreas: Within normal limits. Spleen: Within normal limits. Adrenals/Urinary Tract: Adrenal glands are within normal limits. 11 mm left upper pole renal cyst (series 2/ image 65). 6 mm right lower pole renal cyst (series 2/ image 76). No hydronephrosis. Stomach/Bowel: Wall thickening/ mass in the gastric cardia (series 2/ image 51), likely corresponding to the endoscopic lesion. Visualized bowel is unremarkable. Vascular/Lymphatic: No evidence abdominal aortic aneurysm. 10 mm short axis left para-aortic node (series 2/ image 68), suspicious. Other: No abdominal ascites. Musculoskeletal: Visualized lumbar spine is unremarkable. IMPRESSION: Wall thickening/ mass in the gastric cardia, corresponding to the suspected gastric mass on endoscopy. Multifocal hepatic metastases in both lobes, measuring up to 3.1 cm in segment 7. Suspected lower esophageal and left para-aortic nodal metastases. Multifocal patchy opacities in the lingula and bilateral lower lobes, suspicious for pneumonia. Suspected 1.9 cm pulmonary  abscess in the right lower lobe. Small bilateral pleural effusions. Please note that the pelvis was not imaged. Electronically Signed   By: Julian Hy M.D.   On: 06/10/2016 13:41   Ct Abdomen W Contrast  Result Date: 06/10/2016 CLINICAL DATA:  Malignant appearing gastric mass on upper endoscopy, dysphasia, weight loss, iron deficiency anemia EXAM: CT CHEST AND ABDOMEN WITH CONTRAST TECHNIQUE: Multidetector CT imaging of the chest and abdomen was performed following the standard protocol during bolus administration of intravenous contrast. CONTRAST:  100 mL Isovue 300 IV COMPARISON:  CTA abdomen/ pelvis dated 08/28/2014 FINDINGS: CT CHEST FINDINGS Cardiovascular: Heart is top-normal in size. No pericardial effusion. Coronary atherosclerosis in the LAD. Postsurgical changes related to prior CABG. Mild atherosclerotic calcifications of the  aortic arch. Mediastinum/Nodes: Small mediastinal lymph nodes, including a 7 mm short axis low right paratracheal node (series 2/ image 23) and an 8 mm short axis AP window node (series 2/ image 22), within the upper limits of normal. 9 mm short axis lower paraesophageal node (series 2/image 47), suspicious. Visualized thyroid is unremarkable. Fluid within the mid/distal esophagus. Lungs/Pleura: Patchy opacities in the lingula and bilateral lower lobes, left lower lobe predominant, suspicious for pneumonia. Superimposed atelectasis is possible. Associated 1.9 cm cavitary lesion in the right lower lobe (series 2/ image 41), likely reflecting an associated pulmonary abscess. Small bilateral pleural effusions. No suspicious pulmonary nodules. Underlying mild centrilobular and paraseptal emphysematous changes. No pneumothorax. Musculoskeletal: Stable bone island in the anterior T8 vertebral body (sagittal image 67). Median sternotomy. CT ABDOMEN FINDINGS Hepatobiliary: Multifocal hepatic metastases, at least 8 in number, most of which are in the right hepatic lobe. Dominant  3.1 cm metastasis in segment 7 (series 2/ image 48). A 2.0 cm metastasis is present in segment 2 (series 2/image 58). Gallbladder is unremarkable. No intrahepatic or extrahepatic ductal dilatation. Pancreas: Within normal limits. Spleen: Within normal limits. Adrenals/Urinary Tract: Adrenal glands are within normal limits. 11 mm left upper pole renal cyst (series 2/ image 65). 6 mm right lower pole renal cyst (series 2/ image 76). No hydronephrosis. Stomach/Bowel: Wall thickening/ mass in the gastric cardia (series 2/ image 51), likely corresponding to the endoscopic lesion. Visualized bowel is unremarkable. Vascular/Lymphatic: No evidence abdominal aortic aneurysm. 10 mm short axis left para-aortic node (series 2/ image 68), suspicious. Other: No abdominal ascites. Musculoskeletal: Visualized lumbar spine is unremarkable. IMPRESSION: Wall thickening/ mass in the gastric cardia, corresponding to the suspected gastric mass on endoscopy. Multifocal hepatic metastases in both lobes, measuring up to 3.1 cm in segment 7. Suspected lower esophageal and left para-aortic nodal metastases. Multifocal patchy opacities in the lingula and bilateral lower lobes, suspicious for pneumonia. Suspected 1.9 cm pulmonary abscess in the right lower lobe. Small bilateral pleural effusions. Please note that the pelvis was not imaged. Electronically Signed   By: Julian Hy M.D.   On: 06/10/2016 13:41        Scheduled Meds: . feeding supplement (ENSURE ENLIVE)  237 mL Oral TID BM  . metoprolol succinate  25 mg Oral Daily  . multivitamin  15 mL Oral Daily  . pantoprazole  40 mg Intravenous Q12H  . piperacillin-tazobactam  3.375 g Intravenous Q8H  . pneumococcal 23 valent vaccine  0.5 mL Intramuscular Tomorrow-1000  . polyethylene glycol  17 g Oral BID  . sodium chloride flush  3 mL Intravenous Q12H   Continuous Infusions: . dextrose 5 % with KCl 20 mEq / L 20 mEq (06/10/16 0723)     LOS: 1 day       Mauricio  Gerome Apley, MD [facility] Pager [phone]  If 7PM-7AM, please contact night-coverage [email or web address] Password [email or web address] 06/11/2016, 11:12 AM

## 2016-06-11 NOTE — Progress Notes (Signed)
HR sustaining 140s MD made aware. Pt having abdominal pain. Orders received for pain

## 2016-06-12 ENCOUNTER — Inpatient Hospital Stay (HOSPITAL_COMMUNITY): Payer: Medicare Other

## 2016-06-12 DIAGNOSIS — J690 Pneumonitis due to inhalation of food and vomit: Secondary | ICD-10-CM

## 2016-06-12 LAB — BASIC METABOLIC PANEL
Anion gap: 8 (ref 5–15)
BUN: 14 mg/dL (ref 6–20)
CO2: 22 mmol/L (ref 22–32)
Calcium: 8 mg/dL — ABNORMAL LOW (ref 8.9–10.3)
Chloride: 104 mmol/L (ref 101–111)
Creatinine, Ser: 1.34 mg/dL — ABNORMAL HIGH (ref 0.61–1.24)
GFR calc Af Amer: 57 mL/min — ABNORMAL LOW (ref >60)
GFR, EST NON AFRICAN AMERICAN: 49 mL/min — AB (ref >60)
Glucose, Bld: 184 mg/dL — ABNORMAL HIGH (ref 65–99)
POTASSIUM: 4.4 mmol/L (ref 3.5–5.1)
SODIUM: 134 mmol/L — AB (ref 135–145)

## 2016-06-12 LAB — CBC WITH DIFFERENTIAL/PLATELET
BASOS ABS: 0 10*3/uL (ref 0.0–0.1)
Basophils Relative: 0 %
EOS PCT: 0 %
Eosinophils Absolute: 0 10*3/uL (ref 0.0–0.7)
HCT: 29.6 % — ABNORMAL LOW (ref 39.0–52.0)
Hemoglobin: 9.5 g/dL — ABNORMAL LOW (ref 13.0–17.0)
LYMPHS ABS: 1.5 10*3/uL (ref 0.7–4.0)
Lymphocytes Relative: 8 %
MCH: 27.3 pg (ref 26.0–34.0)
MCHC: 32.1 g/dL (ref 30.0–36.0)
MCV: 85.1 fL (ref 78.0–100.0)
Monocytes Absolute: 1.7 10*3/uL — ABNORMAL HIGH (ref 0.1–1.0)
Monocytes Relative: 9 %
Neutro Abs: 16.3 10*3/uL — ABNORMAL HIGH (ref 1.7–7.7)
Neutrophils Relative %: 84 %
PLATELETS: 284 10*3/uL (ref 150–400)
RBC: 3.48 MIL/uL — AB (ref 4.22–5.81)
RDW: 16.9 % — ABNORMAL HIGH (ref 11.5–15.5)
WBC: 19.5 10*3/uL — AB (ref 4.0–10.5)

## 2016-06-12 MED ORDER — ASPIRIN EC 81 MG PO TBEC
81.0000 mg | DELAYED_RELEASE_TABLET | Freq: Every day | ORAL | Status: DC
  Administered 2016-06-12: 81 mg via ORAL
  Filled 2016-06-12 (×2): qty 1

## 2016-06-12 MED ORDER — DEXTROSE-NACL 5-0.45 % IV SOLN
INTRAVENOUS | Status: DC
  Administered 2016-06-12: 10:00:00 via INTRAVENOUS

## 2016-06-12 NOTE — Progress Notes (Addendum)
Pt. Lethargic and Withdrawn, Remains in 140s Grimacing, Pain med was given by day shift nurse.Upon Assessment.  02 Sat low 80s- on R/A- Applied 02 2l - 89s, -increased to 3L- Sats  up to mid 90s.  B/p 90s/60s.

## 2016-06-12 NOTE — Progress Notes (Signed)
PROGRESS NOTE    Tony Curtis  [id number] DOB: Nov 07, 1935 DOA: 06/07/2016 PCP: Pcp Not In System    Brief Narrative:  80 yo male with cad and pvd, sp stent on right lower extremity, on asa and plavix, presents with dysphagia and odinophagia. Found with severe anemia and FOBT positive. Gastric fungating bleeding mass found on endoscopy, CT with liver mets. Developed sepsis due to aspiration pneumonia, suspected present on admission. Noted worsening abdominal pain, film cw developing ileus.    Assessment & Plan:   Principal Problem:   Symptomatic anemia Active Problems:   CAD, CABG X 3 9/09. Cath 2012 and 02/05/13- 2/3 grafts occluded- medical Rx   PVD - Rt SFA PTA 8/112   Dehydration   Esophageal dysphagia   Odynophagia   Malignancy (HCC)   Anemia  1. Acute blood loss anemia due to upper GI bleed. No further signs of bleeding, will continue to monitor hb and hct, hb at 9,5 with hct at 29. Will resume aspirin for now.   2. Malignant gastric tumor . Large, necrotic, fungating, friable mass with oozing at the gastric cardia and fundus, pathology with invasive adenocarcinoma, abdomen CT with positive metastatic lesions. Will consult oncology service.  Will continue hydromorphone for pain control as needed.    3. New AKI with dehydration with hypokalemia. Will change IV fluids to d51/2 saline at 75 cc.hr. K at 4.4 and renal function worsening to 1,34. Will continue supportive care, will avoid hypotension or nephrotoxic medications.   4. PVD status post stent on right lower extremity. Old records personally reviewed, stent placed last month, drug eluding stent/ balloon angioplasty. Will resume aspirin for now, continue to hold on plavix for now. No pain.   5. HTN. Improved blood pressure up to 130, tachycardia due to pain, will continue supportive care. Will continue metoprolol.   6. Sepsis, due to aspiration pneumonia, suspected to be present on admission. Will continue IV  fluids with d50.45% saline at 75 cc/hr, will continue antibiotics with Zosyn IV. Continue oxymetry monitoring, aspiration precautions.  Thin liquid, clear diet per speech therapy.    7. Ileus. Clinically improved, pain with improved control, no nausea or vomiting, follow abdominal films with significant improvement. Will continue conservative therapy for now.    DVT prophylaxis:scd Code Status:full  Family Communication: I spoke with family at the bedside and all questions were addressed. Disposition Plan:HOme  Consultants:  Gastroenterology   Procedures: EGD on 10/27 with larger gastric mass.   Antimicrobials:   Zosyn  #2   Subjective: Patient noted to be lethargic and hypoxic this am, by the time of my exam, after IV hydromorphone, feeling better, pain improved, able to pass gas. No nausea or vomiting. Tolerating clears.    Objective: Vitals:   06/11/16 2025 06/11/16 2030 06/11/16 2315 06/12/16 0501  BP: (!) 88/69 97/68 103/69 134/84  Pulse: (!) 149  (!) 133 (!) 112  Resp: 20  (!) 24 18  Temp: 98.6 F (37 C)   97.7 F (36.5 C)  TempSrc: Axillary   Oral  SpO2: (!) 88%  96%   Weight:    56.5 kg (124 lb 9.6 oz)  Height:        Intake/Output Summary (Last 24 hours) at 06/12/16 0837 Last data filed at 06/12/16 0126  Gross per 24 hour  Intake             1315 ml  Output  0 ml  Net             1315 ml   Filed Weights   06/10/16 0628 06/11/16 0623 06/12/16 0501  Weight: 50.3 kg (111 lb) 53.8 kg (118 lb 8 oz) 56.5 kg (124 lb 9.6 oz)    Examination:  General exam: not in pain or dyspnea.  E ENT: mild pallor, oral mucosa moist Respiratory system: bibasilar rales, no wheezing or rhonchi. Cardiovascular system: S1 & S2 heard, RRR. No JVD, murmurs, rubs, gallops or clicks. No pedal edema. Gastrointestinal system: Abdomen mild distended, tympanic to percussion, tender to deep palpation. No organomegaly or masses felt.  Central nervous system:  Alert and oriented. No focal neurological deficits. Extremities: Symmetric 5 x 5 power. Skin: No rashes, lesions or ulcers .     Data Reviewed: I have personally reviewed following labs and imaging studies  CBC:  Recent Labs Lab 06/08/16 1614 06/09/16 0337 06/10/16 0259 06/11/16 0435 06/12/16 0438  WBC 15.4* 16.1* 20.2* 19.4* 19.5*  NEUTROABS  --  13.3* 17.6* 16.2* 16.3*  HGB 9.5* 9.0* 9.1* 9.6* 9.5*  HCT 29.9* 28.2* 28.6* 30.3* 29.6*  MCV 85.7 84.4 85.1 84.2 85.1  PLT 294 286 301 325 [id number]   Basic Metabolic Panel:  Recent Labs Lab 06/08/16 0616 06/09/16 0337 06/10/16 0259 06/11/16 0435 06/12/16 0438  NA 143 140 140 137 134*  K 3.8 3.1* 3.3* 4.1 4.4  CL 113* 114* 111 109 104  CO2 21* 21* 22 21* 22  GLUCOSE 99 139* 167* 116* 184*  BUN 14 9 8 7 14   CREATININE 0.96 0.85 0.97 0.94 1.34*  CALCIUM 8.1* 7.8* 7.7* 8.1* 8.0*   GFR: Estimated Creatinine Clearance: 36.3 mL/min (by C-G formula based on SCr of 1.34 mg/dL (H)). Liver Function Tests: No results for input(s): AST, ALT, ALKPHOS, BILITOT, PROT, ALBUMIN in the last 168 hours. No results for input(s): LIPASE, AMYLASE in the last 168 hours. No results for input(s): AMMONIA in the last 168 hours. Coagulation Profile:  Recent Labs Lab 06/07/16 1257  INR 1.15   Cardiac Enzymes: No results for input(s): CKTOTAL, CKMB, CKMBINDEX, TROPONINI in the last 168 hours. BNP (last 3 results) No results for input(s): PROBNP in the last 8760 hours. HbA1C: No results for input(s): HGBA1C in the last 72 hours. CBG: No results for input(s): GLUCAP in the last 168 hours. Lipid Profile: No results for input(s): CHOL, HDL, LDLCALC, TRIG, CHOLHDL, LDLDIRECT in the last 72 hours. Thyroid Function Tests: No results for input(s): TSH, T4TOTAL, FREET4, T3FREE, THYROIDAB in the last 72 hours. Anemia Panel: No results for input(s): VITAMINB12, FOLATE, FERRITIN, TIBC, IRON, RETICCTPCT in the last 72 hours. Sepsis Labs: No results  for input(s): PROCALCITON, LATICACIDVEN in the last 168 hours.  Recent Results (from the past 240 hour(s))  Culture, blood (Routine X 2) w Reflex to ID Panel     Status: None (Preliminary result)   Collection Time: 06/08/16 10:45 PM  Result Value Ref Range Status   Specimen Description BLOOD RIGHT ARM  Final   Special Requests BOTTLES DRAWN AEROBIC AND ANAEROBIC 5ML  Final   Culture NO GROWTH 3 DAYS  Final   Report Status PENDING  Incomplete  Culture, blood (Routine X 2) w Reflex to ID Panel     Status: None (Preliminary result)   Collection Time: 06/08/16 10:49 PM  Result Value Ref Range Status   Specimen Description BLOOD LEFT HAND  Final   Special Requests AEROBIC BOTTLE ONLY 5ML  Final  Culture NO GROWTH 3 DAYS  Final   Report Status PENDING  Incomplete  Culture, blood (Routine X 2) w Reflex to ID Panel     Status: None (Preliminary result)   Collection Time: 06/10/16  5:54 PM  Result Value Ref Range Status   Specimen Description WRIST RIGHT  Final   Special Requests BOTTLES DRAWN AEROBIC AND ANAEROBIC 5CC  Final   Culture NO GROWTH < 24 HOURS  Final   Report Status PENDING  Incomplete  Culture, blood (Routine X 2) w Reflex to ID Panel     Status: None (Preliminary result)   Collection Time: 06/10/16  5:54 PM  Result Value Ref Range Status   Specimen Description WRIST LEFT  Final   Special Requests BOTTLES DRAWN AEROBIC ONLY 3CC  Final   Culture NO GROWTH < 24 HOURS  Final   Report Status PENDING  Incomplete         Radiology Studies: Dg Abd 1 View  Result Date: 06/11/2016 CLINICAL DATA:  Abdominal pain. No free air, portal venous gas, or pneumatosis. EXAM: ABDOMEN - 1 VIEW COMPARISON:  CT scan from yesterday FINDINGS: Air-filled loops of large and small bowel with gas extending into the rectum may represent developing ileus. Contrast in the bladder is consistent with recent contrast-enhanced CT scan. No other significant abnormalities. IMPRESSION: Suggested developing  ileus. Electronically Signed   By: Dorise Bullion III M.D   On: 06/11/2016 16:25   Ct Chest W Contrast  Result Date: 06/10/2016 CLINICAL DATA:  Malignant appearing gastric mass on upper endoscopy, dysphasia, weight loss, iron deficiency anemia EXAM: CT CHEST AND ABDOMEN WITH CONTRAST TECHNIQUE: Multidetector CT imaging of the chest and abdomen was performed following the standard protocol during bolus administration of intravenous contrast. CONTRAST:  100 mL Isovue 300 IV COMPARISON:  CTA abdomen/ pelvis dated 08/28/2014 FINDINGS: CT CHEST FINDINGS Cardiovascular: Heart is top-normal in size. No pericardial effusion. Coronary atherosclerosis in the LAD. Postsurgical changes related to prior CABG. Mild atherosclerotic calcifications of the aortic arch. Mediastinum/Nodes: Small mediastinal lymph nodes, including a 7 mm short axis low right paratracheal node (series 2/ image 23) and an 8 mm short axis AP window node (series 2/ image 22), within the upper limits of normal. 9 mm short axis lower paraesophageal node (series 2/image 47), suspicious. Visualized thyroid is unremarkable. Fluid within the mid/distal esophagus. Lungs/Pleura: Patchy opacities in the lingula and bilateral lower lobes, left lower lobe predominant, suspicious for pneumonia. Superimposed atelectasis is possible. Associated 1.9 cm cavitary lesion in the right lower lobe (series 2/ image 41), likely reflecting an associated pulmonary abscess. Small bilateral pleural effusions. No suspicious pulmonary nodules. Underlying mild centrilobular and paraseptal emphysematous changes. No pneumothorax. Musculoskeletal: Stable bone island in the anterior T8 vertebral body (sagittal image 67). Median sternotomy. CT ABDOMEN FINDINGS Hepatobiliary: Multifocal hepatic metastases, at least 8 in number, most of which are in the right hepatic lobe. Dominant 3.1 cm metastasis in segment 7 (series 2/ image 48). A 2.0 cm metastasis is present in segment 2 (series  2/image 58). Gallbladder is unremarkable. No intrahepatic or extrahepatic ductal dilatation. Pancreas: Within normal limits. Spleen: Within normal limits. Adrenals/Urinary Tract: Adrenal glands are within normal limits. 11 mm left upper pole renal cyst (series 2/ image 65). 6 mm right lower pole renal cyst (series 2/ image 76). No hydronephrosis. Stomach/Bowel: Wall thickening/ mass in the gastric cardia (series 2/ image 51), likely corresponding to the endoscopic lesion. Visualized bowel is unremarkable. Vascular/Lymphatic: No evidence abdominal aortic aneurysm.  10 mm short axis left para-aortic node (series 2/ image 68), suspicious. Other: No abdominal ascites. Musculoskeletal: Visualized lumbar spine is unremarkable. IMPRESSION: Wall thickening/ mass in the gastric cardia, corresponding to the suspected gastric mass on endoscopy. Multifocal hepatic metastases in both lobes, measuring up to 3.1 cm in segment 7. Suspected lower esophageal and left para-aortic nodal metastases. Multifocal patchy opacities in the lingula and bilateral lower lobes, suspicious for pneumonia. Suspected 1.9 cm pulmonary abscess in the right lower lobe. Small bilateral pleural effusions. Please note that the pelvis was not imaged. Electronically Signed   By: Julian Hy M.D.   On: 06/10/2016 13:41   Ct Abdomen W Contrast  Result Date: 06/10/2016 CLINICAL DATA:  Malignant appearing gastric mass on upper endoscopy, dysphasia, weight loss, iron deficiency anemia EXAM: CT CHEST AND ABDOMEN WITH CONTRAST TECHNIQUE: Multidetector CT imaging of the chest and abdomen was performed following the standard protocol during bolus administration of intravenous contrast. CONTRAST:  100 mL Isovue 300 IV COMPARISON:  CTA abdomen/ pelvis dated 08/28/2014 FINDINGS: CT CHEST FINDINGS Cardiovascular: Heart is top-normal in size. No pericardial effusion. Coronary atherosclerosis in the LAD. Postsurgical changes related to prior CABG. Mild  atherosclerotic calcifications of the aortic arch. Mediastinum/Nodes: Small mediastinal lymph nodes, including a 7 mm short axis low right paratracheal node (series 2/ image 23) and an 8 mm short axis AP window node (series 2/ image 22), within the upper limits of normal. 9 mm short axis lower paraesophageal node (series 2/image 47), suspicious. Visualized thyroid is unremarkable. Fluid within the mid/distal esophagus. Lungs/Pleura: Patchy opacities in the lingula and bilateral lower lobes, left lower lobe predominant, suspicious for pneumonia. Superimposed atelectasis is possible. Associated 1.9 cm cavitary lesion in the right lower lobe (series 2/ image 41), likely reflecting an associated pulmonary abscess. Small bilateral pleural effusions. No suspicious pulmonary nodules. Underlying mild centrilobular and paraseptal emphysematous changes. No pneumothorax. Musculoskeletal: Stable bone island in the anterior T8 vertebral body (sagittal image 67). Median sternotomy. CT ABDOMEN FINDINGS Hepatobiliary: Multifocal hepatic metastases, at least 8 in number, most of which are in the right hepatic lobe. Dominant 3.1 cm metastasis in segment 7 (series 2/ image 48). A 2.0 cm metastasis is present in segment 2 (series 2/image 58). Gallbladder is unremarkable. No intrahepatic or extrahepatic ductal dilatation. Pancreas: Within normal limits. Spleen: Within normal limits. Adrenals/Urinary Tract: Adrenal glands are within normal limits. 11 mm left upper pole renal cyst (series 2/ image 65). 6 mm right lower pole renal cyst (series 2/ image 76). No hydronephrosis. Stomach/Bowel: Wall thickening/ mass in the gastric cardia (series 2/ image 51), likely corresponding to the endoscopic lesion. Visualized bowel is unremarkable. Vascular/Lymphatic: No evidence abdominal aortic aneurysm. 10 mm short axis left para-aortic node (series 2/ image 68), suspicious. Other: No abdominal ascites. Musculoskeletal: Visualized lumbar spine is  unremarkable. IMPRESSION: Wall thickening/ mass in the gastric cardia, corresponding to the suspected gastric mass on endoscopy. Multifocal hepatic metastases in both lobes, measuring up to 3.1 cm in segment 7. Suspected lower esophageal and left para-aortic nodal metastases. Multifocal patchy opacities in the lingula and bilateral lower lobes, suspicious for pneumonia. Suspected 1.9 cm pulmonary abscess in the right lower lobe. Small bilateral pleural effusions. Please note that the pelvis was not imaged. Electronically Signed   By: Julian Hy M.D.   On: 06/10/2016 13:41        Scheduled Meds: . feeding supplement (ENSURE ENLIVE)  237 mL Oral TID BM  . metoprolol succinate  25 mg Oral Daily  .  multivitamin  15 mL Oral Daily  . pantoprazole  40 mg Intravenous Q12H  . piperacillin-tazobactam  3.375 g Intravenous Q8H  . pneumococcal 23 valent vaccine  0.5 mL Intramuscular Tomorrow-1000  . polyethylene glycol  17 g Oral BID  . sodium chloride flush  3 mL Intravenous Q12H   Continuous Infusions: . dextrose 5% lactated ringers 75 mL/hr at 06/12/16 0511     LOS: 2 days        Tawni Millers, MD [facility] Pager [phone]  If 7PM-7AM, please contact night-coverage [email or web address] Password [email or web address] 06/12/2016, 8:37 AM

## 2016-06-12 NOTE — Progress Notes (Signed)
Speech Language Pathology Treatment: Dysphagia  Patient Details Name: Tony Curtis MRN: [id number] DOB: July 30, 1936 Today's Date: 06/12/2016 Time: [id number] SLP Time Calculation (min) (ACUTE ONLY): 24 min  Assessment / Plan / Recommendation Clinical Impression  Pt positioned upright in bed. He consumed thin liquid boluses and had episodes of hiccups and facial grimaces indicative of pain following trials. Pt c/o dysphagia and odynophagia. SLP suspects his trouble is in large part esophageally related, and his risk for aspiration is mostly post-prandial. Education was provided for esophageal precautions during meal intake. Recommend continuation of full thin liquid diet. Will plan to follow for an additional visit for further education prior to d/c from ST.   HPI HPI: 80 y.o.malewith medical history significant of CAD (s/p CABG x3 '09, multiple caths, PVD, GERD, HTN, HLD, MI, prostate cancer who presents to [facility] with symptoms of dysphagia of solids and liquids, odynophagia, substernal chest pain, epigastric abdominal pain, generalized weakness, constipation. Per chart he has had hoarse voice, pain with swallowing both liquids and solids as well as difficulty swallowing food and liquid and regurgitates food almost every time he eats. Also admits to constipation, no BM for 1.5 weeks. CXR COPD changes without infiltrate      SLP Plan  Continue with current plan of care     Recommendations  Diet recommendations: Thin liquid Liquids provided via: Straw;Cup;Teaspoon Medication Administration: Crushed with puree Supervision: Patient able to self feed Compensations: Minimize environmental distractions;Slow rate;Small sips/bites;Follow solids with liquid Postural Changes and/or Swallow Maneuvers: Seated upright 90 degrees;Upright 30-60 min after meal                Oral Care Recommendations: Oral care BID Follow up Recommendations: Other (comment) (tba) Plan: Continue with current  plan of care       [place], Student SLP  Shela Leff 06/12/2016, 4:41 PM

## 2016-06-13 ENCOUNTER — Telehealth: Payer: Self-pay | Admitting: Cardiovascular Disease

## 2016-06-13 DIAGNOSIS — D649 Anemia, unspecified: Secondary | ICD-10-CM

## 2016-06-13 DIAGNOSIS — Z8546 Personal history of malignant neoplasm of prostate: Secondary | ICD-10-CM

## 2016-06-13 DIAGNOSIS — C259 Malignant neoplasm of pancreas, unspecified: Secondary | ICD-10-CM

## 2016-06-13 DIAGNOSIS — C169 Malignant neoplasm of stomach, unspecified: Secondary | ICD-10-CM

## 2016-06-13 DIAGNOSIS — J189 Pneumonia, unspecified organism: Secondary | ICD-10-CM

## 2016-06-13 DIAGNOSIS — C787 Secondary malignant neoplasm of liver and intrahepatic bile duct: Secondary | ICD-10-CM

## 2016-06-13 DIAGNOSIS — R627 Adult failure to thrive: Secondary | ICD-10-CM

## 2016-06-13 DIAGNOSIS — Z87891 Personal history of nicotine dependence: Secondary | ICD-10-CM

## 2016-06-13 LAB — BASIC METABOLIC PANEL
Anion gap: 7 (ref 5–15)
BUN: 10 mg/dL (ref 6–20)
CHLORIDE: 107 mmol/L (ref 101–111)
CO2: 23 mmol/L (ref 22–32)
CREATININE: 1.08 mg/dL (ref 0.61–1.24)
Calcium: 7.8 mg/dL — ABNORMAL LOW (ref 8.9–10.3)
GFR calc Af Amer: >60 mL/min (ref >60)
GFR calc non Af Amer: >60 mL/min (ref >60)
GLUCOSE: 125 mg/dL — AB (ref 65–99)
POTASSIUM: 3.4 mmol/L — AB (ref 3.5–5.1)
Sodium: 137 mmol/L (ref 135–145)

## 2016-06-13 LAB — CBC WITH DIFFERENTIAL/PLATELET
Basophils Absolute: 0 10*3/uL (ref 0.0–0.1)
Basophils Relative: 0 %
EOS ABS: 0.2 10*3/uL (ref 0.0–0.7)
Eosinophils Relative: 1 %
HEMATOCRIT: 25.5 % — AB (ref 39.0–52.0)
HEMOGLOBIN: 8.2 g/dL — AB (ref 13.0–17.0)
LYMPHS ABS: 1.2 10*3/uL (ref 0.7–4.0)
LYMPHS PCT: 8 %
MCH: 27.4 pg (ref 26.0–34.0)
MCHC: 32.2 g/dL (ref 30.0–36.0)
MCV: 85.3 fL (ref 78.0–100.0)
MONOS PCT: 10 %
Monocytes Absolute: 1.4 10*3/uL — ABNORMAL HIGH (ref 0.1–1.0)
NEUTROS PCT: 81 %
Neutro Abs: 11.9 10*3/uL — ABNORMAL HIGH (ref 1.7–7.7)
Platelets: 310 10*3/uL (ref 150–400)
RBC: 2.99 MIL/uL — AB (ref 4.22–5.81)
RDW: 16.8 % — ABNORMAL HIGH (ref 11.5–15.5)
WBC: 14.8 10*3/uL — AB (ref 4.0–10.5)

## 2016-06-13 LAB — CULTURE, BLOOD (ROUTINE X 2)
Culture: NO GROWTH
Culture: NO GROWTH

## 2016-06-13 MED ORDER — PANTOPRAZOLE SODIUM 40 MG PO TBEC
40.0000 mg | DELAYED_RELEASE_TABLET | Freq: Two times a day (BID) | ORAL | Status: DC
  Filled 2016-06-13: qty 1

## 2016-06-13 MED ORDER — LIDOCAINE VISCOUS 2 % MT SOLN
15.0000 mL | Freq: Four times a day (QID) | OROMUCOSAL | Status: DC | PRN
  Filled 2016-06-13: qty 15

## 2016-06-13 MED ORDER — FENTANYL 25 MCG/HR TD PT72
25.0000 ug | MEDICATED_PATCH | TRANSDERMAL | Status: DC
  Administered 2016-06-13: 25 ug via TRANSDERMAL
  Filled 2016-06-13: qty 1

## 2016-06-13 MED ORDER — BOOST / RESOURCE BREEZE PO LIQD: 1.0000 | Freq: Three times a day (TID) | ORAL | Status: DC

## 2016-06-13 MED ORDER — BACLOFEN 5 MG HALF TABLET
5.0000 mg | ORAL_TABLET | ORAL | Status: DC | PRN
  Filled 2016-06-13: qty 1

## 2016-06-13 NOTE — Telephone Encounter (Signed)
New message      Dr Cathlean Sauer is requesting a callback from Dr Kennon Holter nurse.  He did not want Trish, He said it was something the nurse could answer.  Please call

## 2016-06-13 NOTE — Progress Notes (Signed)
Speech Language Pathology Discharge Patient Details Name: ONEILL BAIS MRN: [id number] DOB: [birth year] Today's Date: 06/13/2016 Time: 9166-0600 SLP Time Calculation (min) (ACUTE ONLY): 20 min  Patient discharged from SLP services secondary to goals met and no further SLP needs identified.  Please see latest therapy progress note for current level of functioning and progress toward goals.    Progress and discharge plan discussed with patient and/or caregiver: Patient/Caregiver agrees with plan  GO Tarynn Garling B. Rutherford Nail, M.S., CCC-SLP Speech-Language Pathologist   Sony Schlarb 06/13/2016, 9:57 AM

## 2016-06-13 NOTE — Consult Note (Signed)
[place] CONSULT NOTE  Patient Care Team: Pcp Not In System as PCP - General Ivin Poot, MD as Attending Physician (Cardiothoracic Surgery) Kathie Rhodes, MD (Urology) Magnus Sinning, MD (Orthopedic Surgery)  CHIEF COMPLAINTS/PURPOSE OF CONSULTATION:  Newly diagnosed presumed diagnosis of metastatic gastric cancer  HISTORY OF PRESENTING ILLNESS:  Tony Curtis 80 y.o. male is admitted to the hospital with the profound weight loss of 440 pounds in the last 3 months. He has had very poor appetite and poor oral intake. He is also had chronic melena. He had a prior history of coronary artery disease status post CABG and multiple Peripheral Vascular Disease in Addition to Prior History of Prostate Cancer. He Was Admitted to the Hospital with Dysphagia Odynophagia and Sternal Pain and Generalized Weakness and Weight Loss. He was found to be anemic with a hemoglobin of 7.5. He was transfused blood. He had an upper endoscopy which revealed gastric fungating mass and a CT of the abdomen revealed 8 liver metastases the largest measuring 3.1 cm. CT of the chest revealed aspiration pneumonia versus an abscess in the lung. At-home patient is extremely frail and sleeps most of the time. He has not been doing much activity. He has progressively gotten significantly weaker. His performance status is ECOG of 3.  I reviewed her records extensively and collaborated the history with the patient.  MEDICAL HISTORY:  Past Medical History:  Diagnosis Date  . Abnormal EKG, resolved with slower HR 06/05/2013  . Arthritis    "about my whole body"  . CAD (coronary artery disease)    CABG 9/09. Cath 2012 and 02/05/13  . Chronic back pain    "mostly between my shoulders" (01/04/2015)  . Daily headache    "mostly at night; think it's because of the shingles I had" (01/04/2015)  . Gastric tumor    EGD 06/09/16  . GERD (gastroesophageal reflux disease)   . H/O echocardiogram 07/08/2008   EF  40% by 2D '09. EF 60% cath 6/14  . HTN (hypertension)   . Hyperlipemia   . Myocardial infarction 2009  . Prostate cancer ([place])   . PVD (peripheral vascular disease) ([place])    LE dopp (10/19/11) patent R SFA stent, R ant tib occluded with reconstitution within the dorsalis pedis, L SFA 70-99%, L ant tib occluded with reconstitution of flow in the dorsalis pedis, bil ABI 1.2; carotid dopp (04-27-08)-normal  . UTI (urinary tract infection) 06/05/2013    SURGICAL HISTORY: Past Surgical History:  Procedure Laterality Date  . ABDOMINAL ANGIOGRAM Left 10/13/08   LSFA PTA  . ABDOMINAL ANGIOGRAM Right 07/28/2008   RSFA  . ANGIOPLASTY / STENTING FEMORAL Right 01/04/2015  . CARDIAC CATHETERIZATION  07/04/2011   patent LIMA to diag branch, occluded ben grafts to diag and ramus branch; EF >60%   . CARDIAC CATHETERIZATION  03/20/2008   occluded prox LAD with great collaterals from the ramus and the circ and RCA to LAD; EF 45-50%, med tx  . CARDIAC CATHETERIZATION  02/05/13   SVGs occluded, no change  . CARDIAC CATHETERIZATION N/A 01/04/2015   Procedure: Left Heart Cath and Cors/Grafts Angiography;  Surgeon: Lorretta Harp, MD;  Location: [facility];  Service: Cardiovascular;  Laterality: N/A;  . CATARACT EXTRACTION W/ INTRAOCULAR LENS  IMPLANT, BILATERAL Bilateral   . CORONARY ARTERY BYPASS GRAFT  05/06/2008   x3, LIMA to LAD, SVG to diad, SVG to ramus inermediate  . ESOPHAGOGASTRODUODENOSCOPY (EGD) WITH PROPOFOL N/A 06/09/2016  Procedure: ESOPHAGOGASTRODUODENOSCOPY (EGD) WITH PROPOFOL;  Surgeon: Doran Stabler, MD;  Location: [facility];  Service: Endoscopy;  Laterality: N/A;  . FRACTURE SURGERY    . KNEE DISLOCATION SURGERY Right    "they cut it"  . LEFT HEART CATHETERIZATION WITH CORONARY ANGIOGRAM N/A 02/05/2013   Procedure: LEFT HEART CATHETERIZATION WITH CORONARY ANGIOGRAM;  Surgeon: Lorretta Harp, MD;  Location[facility];  Service: Cardiovascular;  Laterality: N/A;  . LEFT  HEART CATHETERIZATION WITH CORONARY/GRAFT ANGIOGRAM  07/04/2011   Procedure: LEFT HEART CATHETERIZATION WITH Beatrix Fetters;  Surgeon: Lorretta Harp, MD;  Location: [facility] CATH LAB;  Service: Cardiovascular;;  . LEFT HEART CATHETERIZATION WITH CORONARY/GRAFT ANGIOGRAM  02/05/2013   Procedure: LEFT HEART CATHETERIZATION WITH Beatrix Fetters;  Surgeon: Lorretta Harp, MD;  Location: [facility] CATH LAB;  Service: Cardiovascular;;  . PERIPHERAL VASCULAR CATHETERIZATION N/A 01/04/2015   Procedure: Lower Extremity Angiography;  Surgeon: Lorretta Harp, MD;  Location: [facility];  Service: Cardiovascular;  Laterality: N/A;  . PERIPHERAL VASCULAR CATHETERIZATION N/A 05/11/2016   Procedure: Lower Extremity Angiography;  Surgeon: Lorretta Harp, MD;  Location: [facility];  Service: Cardiovascular;  Laterality: N/A;  . PERIPHERAL VASCULAR CATHETERIZATION Right 05/11/2016   Procedure: Peripheral Vascular Balloon Angioplasty;  Surgeon: Lorretta Harp, MD;  Location: [facility];  Service: Cardiovascular;  Laterality: Right;  SFA  . PROSTATECTOMY    . [place]   "football injury"  . TONSILLECTOMY  1940's    SOCIAL HISTORY: Social History   Social History  . Marital status: Married    Spouse name: N/A  . Number of children: N/A  . Years of education: N/A   Occupational History  . Not on file.   Social History Main Topics  . Smoking status: Former Smoker    Packs/day: 0.10    Years: 20.00    Types: Cigarettes    Quit date: 06/14/1979  . Smokeless tobacco: Never Used  . Alcohol use Yes     Comment: 01/04/2015 "might have a glass of wine q couple years"  . Drug use: No  . Sexual activity: Not on file   Other Topics Concern  . Not on file   Social History Narrative  . No narrative on file    FAMILY HISTORY: Family History  Problem Relation Age of Onset  . Adopted: Yes  . Coronary artery disease Sister   . Coronary artery  disease Father 74  . Cancer Mother 9    ALLERGIES:  has No Known Allergies.  MEDICATIONS:  Current Facility-Administered Medications  Medication Dose Route Frequency Provider Last Rate Last Dose  . acetaminophen (TYLENOL) tablet 650 mg  650 mg Oral Q6H PRN Merton Border, MD   650 mg at 06/11/16 2302  . aspirin EC tablet 81 mg  81 mg Oral Daily Mauricio Gerome Apley, MD   81 mg at 06/12/16 1642  . dextrose 5 %-0.45 % sodium chloride infusion   Intravenous Continuous Tawni Millers, MD 75 mL/hr at 06/12/16 0950    . feeding supplement (ENSURE ENLIVE) (ENSURE ENLIVE) liquid 237 mL  237 mL Oral TID BM Tawni Millers, MD   237 mL at 06/12/16 2138  . HYDROmorphone (DILAUDID) injection 1 mg  1 mg Intravenous Q4H PRN Tawni Millers, MD   1 mg at 06/12/16 2139  . metoprolol succinate (TOPROL-XL) 24 hr tablet 25 mg  25 mg Oral Daily Mauricio Gerome Apley, MD  25 mg at 06/12/16 0951  . multivitamin liquid 15 mL  15 mL Oral Daily Mauricio Gerome Apley, MD   15 mL at 06/12/16 0945  . ondansetron (ZOFRAN) injection 4 mg  4 mg Intravenous Q4H PRN Tawni Millers, MD      . pantoprazole (PROTONIX) injection 40 mg  40 mg Intravenous Q12H Shon Millet, DO   40 mg at 06/12/16 2139  . piperacillin-tazobactam (ZOSYN) IVPB 3.375 g  3.375 g Intravenous Q8H Mauricio Gerome Apley, MD   3.375 g at 06/13/16 0105  . polyethylene glycol (MIRALAX / GLYCOLAX) packet 17 g  17 g Oral BID Lavone Nian [place], [place]   17 g at 06/12/16 0945  . sodium chloride flush (NS) 0.9 % injection 3 mL  3 mL Intravenous Q12H Shon Millet, DO   3 mL at 06/12/16 2157    REVIEW OF SYSTEMS:   Constitutional: Denies fevers, chills or abnormal night sweats Eyes: Denies blurriness of vision, double vision or watery eyes Ears, nose, mouth, throat, and face: Difficulty with swallowing or eating Respiratory: Denies cough, dyspnea or wheezes Cardiovascular: Denies palpitation, chest  discomfort or lower extremity swelling Gastrointestinal: Abdominal pain, retrosternal pain, dysphagia, odynophagia Skin: Denies abnormal skin rashes Lymphatics: Denies new lymphadenopathy or easy bruising Neurological:Denies numbness, tingling or new weaknesses Behavioral/Psych: Mood is stable, no new changes  All other systems were reviewed with the patient and are negative.  PHYSICAL EXAMINATION: ECOG PERFORMANCE STATUS: 3 - Symptomatic, >50% confined to bed  Vitals:   06/12/16 2034 06/13/16 0448  BP: 119/66 108/65  Pulse:  (!) 119  Resp: 18 18  Temp: 98.7 F (37.1 C) 99.8 F (37.7 C)   Filed Weights   06/11/16 0623 06/12/16 0501 06/13/16 0448  Weight: 118 lb 8 oz (53.8 kg) 124 lb 9.6 oz (56.5 kg) 121 lb 4.1 oz (55 kg)    GENERAL:alert, no distress and comfortable SKIN: skin color, texture, turgor are normal, no rashes or significant lesions EYES: normal, conjunctiva are pink and non-injected, sclera clear OROPHARYNX:no exudate, no erythema and lips, buccal mucosa, and tongue normal  NECK: supple, thyroid normal size, non-tender, without nodularity LYMPH:  no palpable lymphadenopathy in the cervical, axillary or inguinal LUNGS: clear to auscultation and percussion with normal breathing effort HEART: regular rate & rhythm and no murmurs and no lower extremity edema ABDOMEN: Tender to palpation, especially in the right upper quadrant Musculoskeletal:no cyanosis of digits and no clubbing  PSYCH: alert & oriented x 3 with fluent speech NEURO: no focal motor/sensory deficits  LABORATORY DATA:  I have reviewed the data as listed Lab Results  Component Value Date   WBC 14.8 (H) 06/13/2016   HGB 8.2 (L) 06/13/2016   HCT 25.5 (L) 06/13/2016   MCV 85.3 06/13/2016   PLT 310 06/13/2016   Lab Results  Component Value Date   NA 137 06/13/2016   K 3.4 (L) 06/13/2016   CL 107 06/13/2016   CO2 23 06/13/2016    RADIOGRAPHIC STUDIES: I have personally reviewed the radiological  reports and agreed with the findings in the report.  ASSESSMENT AND PLAN:  1. Presumed diagnosis of metastatic gastric cancer with liver metastases: I discussed with the patient and his family including his wife and daughter that stage IV gastric cancer is incurable. Chemotherapy would be a treatment that could prolong his life. However because of his frail state and his multiple medical conditions, including his current episode of pneumonia/abscess, I did not recommend any systemic therapy. I  discussed with the family that the prognosis of metastatic gastric cancer with extensive liver metastases with such poor performance status is extremely poor. I anticipate that he may have less than 3 months of survival.  Recommendation: Hospice care Patient and family appear to be in agreement. I offered our cancer center support and prayers to make sure his end-of-life is peaceful.  2. severe anemia: Supportive care  All questions were answered. The patient knows to call the clinic with any problems, questions or concerns.    Rulon Eisenmenger, MD @T @

## 2016-06-13 NOTE — Progress Notes (Signed)
CM talked to patient to offer home hospice choice, patient chose Hosp[facility] and [place]; Collie Siad with [facility] in for arrangements; DME to be delivered to the patient's home in the am. He will be discharged home via private vehicle. Mindi Slicker [facility] [phone]

## 2016-06-13 NOTE — Progress Notes (Addendum)
Notified by Larina Bras of PMT and Olga Coaster, [facility] of family request for Hospice and [place] services at home after discharge. Chart and patient information currently under review to confirm hospice eligibility.   Spoke with Mr. Renfro, wife Enid Derry and daughter Judeen Hammans at bedside to initiate education related to hospice philosophy, services and team approach to care. Family verbalized understanding of the information provided. Per discussion plan is for discharge to home by personal vehicle.  Discharge date undetermined at this time; possibly tomorrow.    Patient will need prescriptions for discharge comfort medications.  DME needs discussed and family requested hospital bed, OBT, 02 concentrator, and transfer bench.  Patient  Has his own walker already.   TC was placed to [facility] from [facility] to also deliver a portable 02 tank to patient's room  for his transfer home.  [facility] equipment manager Jewel Ysidro Evert notified and will contact [place] to arrange delivery to the home.  The home address has been verified and is correct in the chart; daughter Judeen Hammans  Is the family member to be contacted to arrange time of delivery.  [facility] Referral Center aware of the above.  Completed discharge summary will need to be faxed to [facility] at [phone] when final.   Please notify [facility] when patient is ready to leave unit at discharge-call [phone].  [facility] information and contact numbers have been given to patient and family  during visit.  Above information shared with Olga Coaster,  CMRN.  Please call with any questions.   Thank You,  Mickie Kay, [facility] Liaison  [phone]

## 2016-06-13 NOTE — Telephone Encounter (Signed)
Returned call to Dr Sander Radon, hospitalist. Patient was admitted recently for bleeding and anemia. He was found to have stomach malignancy. His Plavix was stopped but he remains on Aspirin. Dr Cathlean Sauer wanted to make sure Dr [name] was aware. Patient has decided he wants to go home on Hospice which will prob be tomorrow.  He is aware Dr [name] is out of the office this week. Will route this to Dr [name] as Juluis Rainier and any further advice if needed.

## 2016-06-13 NOTE — Progress Notes (Signed)
Palliative Medicine RN Note: Palliative Care order noted. PMT RN met with pt, who ok'd discussion with wife Enid Derry, 2 daughters Judeen Hammans and [place]), and granddaughter Morene Antu).  Long discussion with family regarding care after d/c. They report that Dr Lindi Adie was very clear that pt has a short time left to live and that there are no curative options. Dr Lindi Adie recommended, as family says, "we just keep him comfortable."   Pt is a [place] Veteran who is service-connected through the [place]. PTA, family reports he has been acting different recently, now hoarding meds and other items in boxes in his room; they report that there is only a foot wide path to his bed. Pt is angry with family, often refusing meals and meds, but will participate with non-family CG. He has been having dizzy spells and is newly using a walker at home. He has had periods (several days long) of refusing personal care. He uses depends d/t urinary leakage following prostate ca. He had been able to change this himself, but has been too weak here.  Discussed d/c options including [facility] w hospice, home hospice, inpt hospice. Family reports that pt is adamant he goes home; they are willing to try to make it work. They are aware that pt's care may outpace what they can provide, and they understand that location of care may need to change. Goal is comfort and to accommodate his wishes, needs. Pt has new orders for fentanyl patch today to help manage pain in a way that can transition home. Family reports that Dr Cathlean Sauer told them pt would d/c "in a few days." Family would like to go home with hospice at that time.   Offered choice/list. Family has had experience with [facility], and they live near [place] if pt ends up needing inpt hospice. Called referral in to [place] with [facility]; she will make contact with family. There is not HCPOA, so primary decision maker is wife. However, she requests we call daughter Enid Derry to set up meeting with family. Updated contact  information in the chart.  Discussed pt with Dr Hilma Favors, PMT Medical Director. Pt has hiccups that are uncomfortable; she ordered prn Baclofen; if this is ineffective, she may progress the pt to thorazine tomorrow. She also gave order for home hospice.   Plan for PMT RN to follow up tomorrow to ensure hospice plan initiated and to f/u on hiccup relief.  Marjie Skiff Eldin Bonsell, RN, BSN, [facility] 06/13/2016 1:52 PM Cell [phone] 8:00-4:00 Monday-Friday Office [phone]

## 2016-06-13 NOTE — Progress Notes (Signed)
PROGRESS NOTE    Tony Curtis  [id number] DOB: [birth year] DOA: 06/07/2016 PCP: Pcp Not In System   Brief Narrative:  80 yo male with cad and pvd, sp stent on right lower extremity, on asa and plavix, presents with dysphagia and odinophagia. Found with severe anemia and FOBT positive. Gastric fungating bleeding mass found on endoscopy, CT with liver mets. Developed sepsis due to aspiration pneumonia, suspected present on admission. Noted worsening abdominal pain, film cw developing ileus. Ileus self resolved. Pathology positive for invasive adenocarcinoma. Patient will go home on home hospice.    Assessment & Plan:   Principal Problem:   Symptomatic anemia Active Problems:   CAD, CABG X 3 9/09. Cath 2012 and 02/05/13- 2/3 grafts occluded- medical Rx   PVD - Rt SFA PTA 8/112   Dehydration   Esophageal dysphagia   Odynophagia   Malignancy (HCC)   Anemia    1. Acute blood loss anemia due to upper GI bleed. Resumed patient on aspirin. Will continue to monitor cell count. Will change protonix vi to po.   2. Malignant gastric tumor . Large, necrotic, fungating, friable mass with oozing at the gastric cardia and fundus, pathology with invasive adenocarcinoma, abdomen CT with positive metastatic lesions. Patient and his family have decided to go home with hospice, consult has been placed. Will start patient on fentanyl patch to decrease use of hydromorphone. Added topical oral lidocaine.   3. New AKI with dehydration with hypokalemia. Improved renal function with hydration, will follow renal panel in am, L at 3,4.   4. PVD status post stent on right lower extremity. Old records personally reviewed, stent placed last month, drug eluding stent/ balloon angioplasty.  Patient back on aspirin but not on plavix. Dr Kennon Holter nurse informed. Gastric lesion with high risk of bleeding per GI. No leg pain.   5. HTN. Systolic blood pressure  [id number] to 120, will continue metoprolol. Reactive  tachycardia due to pain.   6. Sepsis, due to aspiration pneumonia, suspected to be present on admission.  IV fluids with d50.45% saline at 75 cc/hr, antibiotics with Zosyn IV. Continue thin liquid, clear diet per speech therapy. May be able to change to oral antibiotic therapy in the next 24 hours.     7. Ileus. Resolved, will continue miralax po. Tolerating clear diet.    DVT prophylaxis:scd Code Status:full  Family Communication: I spoke with family at the bedside and all questions were addressed. Disposition Plan:HOme  Consultants:  Gastroenterology   Procedures: EGD on 10/27 with larger gastric mass.   Antimicrobials:   Zosyn  #3   Subjective: Patient with improved pain and dyspnea, no bowel movement yet. No nausea or vomiting. Abdominal pain improved with medications.   Objective: Vitals:   06/12/16 1634 06/12/16 2034 06/13/16 0448 06/13/16 1142  BP: 126/72 119/66 108/65 121/69  Pulse: (!) 121  (!) 119 (!) 113  Resp: 18 18 18 18   Temp: 98.1 F (36.7 C) 98.7 F (37.1 C) 99.8 F (37.7 C) 97.7 F (36.5 C)  TempSrc: Oral Oral Oral Oral  SpO2: 100% 97% 96% 100%  Weight:   55 kg (121 lb 4.1 oz)   Height:        Intake/Output Summary (Last 24 hours) at 06/13/16 1225 Last data filed at 06/13/16 1000  Gross per 24 hour  Intake          1316.75 ml  Output              300 ml  Net          1016.75 ml   Filed Weights   06/11/16 0623 06/12/16 0501 06/13/16 0448  Weight: 53.8 kg (118 lb 8 oz) 56.5 kg (124 lb 9.6 oz) 55 kg (121 lb 4.1 oz)    Examination:  General exam: deconditioned and ill looking appearing. E ENT: positive conjunctival pallor, oral mucosa most.  Respiratory system: mild rales at bases, no wheezing or rhonchi.  Respiratory effort normal. Cardiovascular system: S1 & S2 heard, RRR. No JVD, murmurs, rubs, gallops or clicks. No pedal edema. Gastrointestinal system: Abdomen is nondistended, soft and nontender. No organomegaly or masses  felt. Normal bowel sounds heard. Central nervous system: Alert and oriented. No focal neurological deficits. Extremities: Symmetric 5 x 5 power. Skin: No rashes, lesions or ulcers     Data Reviewed: I have personally reviewed following labs and imaging studies  CBC:  Recent Labs Lab 06/09/16 0337 06/10/16 0259 06/11/16 0435 06/12/16 0438 06/13/16 0436  WBC 16.1* 20.2* 19.4* 19.5* 14.8*  NEUTROABS 13.3* 17.6* 16.2* 16.3* 11.9*  HGB 9.0* 9.1* 9.6* 9.5* 8.2*  HCT 28.2* 28.6* 30.3* 29.6* 25.5*  MCV 84.4 85.1 84.2 85.1 85.3  PLT 286 301 325 284 [id number]   Basic Metabolic Panel:  Recent Labs Lab 06/09/16 0337 06/10/16 0259 06/11/16 0435 06/12/16 0438 06/13/16 0436  NA 140 140 137 134* 137  K 3.1* 3.3* 4.1 4.4 3.4*  CL 114* 111 109 104 107  CO2 21* 22 21* 22 23  GLUCOSE 139* 167* 116* 184* 125*  BUN 9 8 7 14 10   CREATININE 0.85 0.97 0.94 1.34* 1.08  CALCIUM 7.8* 7.7* 8.1* 8.0* 7.8*   GFR: Estimated Creatinine Clearance: 43.9 mL/min (by C-G formula based on SCr of 1.08 mg/dL). Liver Function Tests: No results for input(s): AST, ALT, ALKPHOS, BILITOT, PROT, ALBUMIN in the last 168 hours. No results for input(s): LIPASE, AMYLASE in the last 168 hours. No results for input(s): AMMONIA in the last 168 hours. Coagulation Profile:  Recent Labs Lab 06/07/16 1257  INR 1.15   Cardiac Enzymes: No results for input(s): CKTOTAL, CKMB, CKMBINDEX, TROPONINI in the last 168 hours. BNP (last 3 results) No results for input(s): PROBNP in the last 8760 hours. HbA1C: No results for input(s): HGBA1C in the last 72 hours. CBG: No results for input(s): GLUCAP in the last 168 hours. Lipid Profile: No results for input(s): CHOL, HDL, LDLCALC, TRIG, CHOLHDL, LDLDIRECT in the last 72 hours. Thyroid Function Tests: No results for input(s): TSH, T4TOTAL, FREET4, T3FREE, THYROIDAB in the last 72 hours. Anemia Panel: No results for input(s): VITAMINB12, FOLATE, FERRITIN, TIBC, IRON,  RETICCTPCT in the last 72 hours. Sepsis Labs: No results for input(s): PROCALCITON, LATICACIDVEN in the last 168 hours.  Recent Results (from the past 240 hour(s))  Culture, blood (Routine X 2) w Reflex to ID Panel     Status: None (Preliminary result)   Collection Time: 06/08/16 10:45 PM  Result Value Ref Range Status   Specimen Description BLOOD RIGHT ARM  Final   Special Requests BOTTLES DRAWN AEROBIC AND ANAEROBIC 5ML  Final   Culture NO GROWTH 4 DAYS  Final   Report Status PENDING  Incomplete  Culture, blood (Routine X 2) w Reflex to ID Panel     Status: None (Preliminary result)   Collection Time: 06/08/16 10:49 PM  Result Value Ref Range Status   Specimen Description BLOOD LEFT HAND  Final   Special Requests AEROBIC BOTTLE ONLY 5ML  Final   Culture  NO GROWTH 4 DAYS  Final   Report Status PENDING  Incomplete  Culture, blood (Routine X 2) w Reflex to ID Panel     Status: None (Preliminary result)   Collection Time: 06/10/16  5:54 PM  Result Value Ref Range Status   Specimen Description WRIST RIGHT  Final   Special Requests BOTTLES DRAWN AEROBIC AND ANAEROBIC 5CC  Final   Culture NO GROWTH 2 DAYS  Final   Report Status PENDING  Incomplete  Culture, blood (Routine X 2) w Reflex to ID Panel     Status: None (Preliminary result)   Collection Time: 06/10/16  5:54 PM  Result Value Ref Range Status   Specimen Description WRIST LEFT  Final   Special Requests BOTTLES DRAWN AEROBIC ONLY 3CC  Final   Culture NO GROWTH 2 DAYS  Final   Report Status PENDING  Incomplete         Radiology Studies: Dg Abd 1 View  Result Date: 06/12/2016 CLINICAL DATA:  Abdominal pain with nausea and vomiting for several weeks. Personal history of prostate carcinoma. EXAM: ABDOMEN - 1 VIEW COMPARISON:  06/11/2016 FINDINGS: There has been resolution of gaseous distention of small bowel and colon since previous study. Bowel gas pattern is within normal limits. Surgical clips again seen within pelvis and  epigastric region. IMPRESSION: Normal bowel gas pattern.  No acute findings. Electronically Signed   By: Earle Gell M.D.   On: 06/12/2016 10:30   Dg Abd 1 View  Result Date: 06/11/2016 CLINICAL DATA:  Abdominal pain. No free air, portal venous gas, or pneumatosis. EXAM: ABDOMEN - 1 VIEW COMPARISON:  CT scan from yesterday FINDINGS: Air-filled loops of large and small bowel with gas extending into the rectum may represent developing ileus. Contrast in the bladder is consistent with recent contrast-enhanced CT scan. No other significant abnormalities. IMPRESSION: Suggested developing ileus. Electronically Signed   By: Dorise Bullion III M.D   On: 06/11/2016 16:25        Scheduled Meds: . aspirin EC  81 mg Oral Daily  . feeding supplement (ENSURE ENLIVE)  237 mL Oral TID BM  . metoprolol succinate  25 mg Oral Daily  . multivitamin  15 mL Oral Daily  . pantoprazole  40 mg Intravenous Q12H  . piperacillin-tazobactam  3.375 g Intravenous Q8H  . polyethylene glycol  17 g Oral BID  . sodium chloride flush  3 mL Intravenous Q12H   Continuous Infusions: . dextrose 5 % and 0.45% NaCl 75 mL/hr at 06/12/16 0950     LOS: 3 days        Lisette Mancebo Gerome Apley, MD [facility] Pager [phone]  If 7PM-7AM, please contact night-coverage [email or web address] Password [email or web address] 06/13/2016, 12:25 PM

## 2016-06-13 NOTE — Progress Notes (Signed)
Speech Language Pathology Treatment: Dysphagia  Patient Details Name: Tony Curtis MRN: [id number] DOB: 1936-01-19 Today's Date: 06/13/2016 Time: [id number] SLP Time Calculation (min) (ACUTE ONLY): 20 min  Assessment / Plan / Recommendation Clinical Impression  Skilled observation of pt consuming 2 sips of thin liquids via straw with timely swallow initiation noted. Pt with facial grimacing during swallow followed by hiccups. Pt stated that he felt like liquids were going down "the same as before." Trials of soft solids continue to be contraindicated d/t continued difficulty swallowing food. Nursing states taht pt consumed 4 oz of thin liquids during entire day yesterday. Plan of care is for pt to discharge home with Hospice Service. ST to sign off as all education is complete.    HPI HPI: 80 y.o.malewith medical history significant of CAD (s/p CABG x3 '09, multiple caths, PVD, GERD, HTN, HLD, MI, prostate cancer who presents to [facility] with symptoms of dysphagia of solids and liquids, odynophagia, substernal chest pain, epigastric abdominal pain, generalized weakness, constipation. Per chart he has had hoarse voice, pain with swallowing both liquids and solids as well as difficulty swallowing food and liquid and regurgitates food almost every time he eats. Also admits to constipation, no BM for 1.5 weeks. CXR COPD changes without infiltrate      SLP Plan  Discharge SLP treatment due to (comment) (inability to upgrade diet d/t mass)     Recommendations  Diet recommendations: Thin liquid (Full liquid diet) Liquids provided via: Straw Medication Administration: Crushed with puree Supervision: Patient able to self feed Compensations: Minimize environmental distractions;Slow rate;Small sips/bites;Follow solids with liquid Postural Changes and/or Swallow Maneuvers: Seated upright 90 degrees;Upright 30-60 min after meal                Oral Care Recommendations: Oral care BID Follow up  Recommendations: Other (comment) (Hospice Services) Plan: Discharge SLP treatment due to (comment) (inability to upgrade diet d/t mass)       GO                Tony Curtis 06/13/2016, 9:58 AM

## 2016-06-14 LAB — CBC WITH DIFFERENTIAL/PLATELET
Basophils Absolute: 0 10*3/uL (ref 0.0–0.1)
Basophils Relative: 0 %
Eosinophils Absolute: 0.2 10*3/uL (ref 0.0–0.7)
Eosinophils Relative: 1 %
HEMATOCRIT: 25.9 % — AB (ref 39.0–52.0)
Hemoglobin: 8.2 g/dL — ABNORMAL LOW (ref 13.0–17.0)
LYMPHS ABS: 1.6 10*3/uL (ref 0.7–4.0)
LYMPHS PCT: 11 %
MCH: 26.7 pg (ref 26.0–34.0)
MCHC: 31.7 g/dL (ref 30.0–36.0)
MCV: 84.4 fL (ref 78.0–100.0)
MONO ABS: 1.2 10*3/uL — AB (ref 0.1–1.0)
MONOS PCT: 8 %
NEUTROS ABS: 12.4 10*3/uL — AB (ref 1.7–7.7)
Neutrophils Relative %: 80 %
Platelets: 331 10*3/uL (ref 150–400)
RBC: 3.07 MIL/uL — ABNORMAL LOW (ref 4.22–5.81)
RDW: 16.3 % — AB (ref 11.5–15.5)
WBC: 15.5 10*3/uL — ABNORMAL HIGH (ref 4.0–10.5)

## 2016-06-14 LAB — BASIC METABOLIC PANEL
Anion gap: 8 (ref 5–15)
BUN: 6 mg/dL (ref 6–20)
CALCIUM: 8 mg/dL — AB (ref 8.9–10.3)
CO2: 24 mmol/L (ref 22–32)
CREATININE: 0.93 mg/dL (ref 0.61–1.24)
Chloride: 106 mmol/L (ref 101–111)
GFR calc Af Amer: >60 mL/min (ref >60)
GFR calc non Af Amer: >60 mL/min (ref >60)
GLUCOSE: 131 mg/dL — AB (ref 65–99)
Potassium: 3.4 mmol/L — ABNORMAL LOW (ref 3.5–5.1)
Sodium: 138 mmol/L (ref 135–145)

## 2016-06-14 MED ORDER — BACLOFEN 10 MG PO TABS: 5.0000 mg | ORAL_TABLET | ORAL | 0 refills | Status: AC | PRN

## 2016-06-14 MED ORDER — AMOXICILLIN-POT CLAVULANATE 875-125 MG PO TABS: 1.0000 | ORAL_TABLET | Freq: Two times a day (BID) | ORAL | 0 refills | Status: DC

## 2016-06-14 MED ORDER — PANTOPRAZOLE SODIUM 40 MG PO TBEC: 40.0000 mg | DELAYED_RELEASE_TABLET | Freq: Two times a day (BID) | ORAL | 0 refills | Status: DC

## 2016-06-14 MED ORDER — FENTANYL 25 MCG/HR TD PT72: 25.0000 ug | MEDICATED_PATCH | TRANSDERMAL | 0 refills | Status: AC

## 2016-06-14 NOTE — Progress Notes (Signed)
Pt has orders to be discharged home with home hospice. Discharge instructions given and pt has no additional questions at this time. Medication regimen reviewed and pt educated. Pt verbalized understanding and has no additional questions. Telemetry box removed. IV removed and site in good condition. Pt stable and waiting for transportation.   Maurene Capes RN

## 2016-06-14 NOTE — Care Management Important Message (Signed)
Important Message  Patient Details  Name: Tony Curtis MRN: [id number] Date of Birth: [birth year]   Medicare Important Message Given:  Yes    Orbie Pyo 06/14/2016, 10:34 AM

## 2016-06-14 NOTE — Progress Notes (Signed)
CM talked to Collie Siad with [employer], DME to be delivered to the patient's home today between [place] is to deliver a portable oxygen tank to the patient's bedside at discharge. Family to transport patient home via private car. Mindi Slicker [facility] [phone]

## 2016-06-14 NOTE — Progress Notes (Signed)
Palliative Medicine RN Note: Rec'd call last night from daughter Clarene Critchley; she left message requesting a meeting w hospice SW. Explained that [facility] requires pt to be admitted first, and she verbalized understanding.   Family plans to stop by bank on the way home to get wife added to checking accounts. Family will get hospice to help coordinate other paperwork/getting affairs in order.  Marjie Skiff Gaege Sangalang, RN, BSN, [facility] 06/14/2016 8:42 AM Cell [phone] 8:00-4:00 Monday-Friday Office [phone]

## 2016-06-14 NOTE — Discharge Summary (Signed)
Physician Discharge Summary  Tony Curtis [id number] DOB: 1936/06/12 DOA: 06/07/2016  PCP: Pcp Not In System  Admit date: 06/07/2016 Discharge date: 06/14/2016  Admitted From: [facility] Disposition: Home with hospice services   Recommendations for Outpatient Follow-up:  START taking fentanyl patch every 72 hours (next due to be placed 11/3).  START taking protonix twice daily to reduce risk of GI bleeding START taking augmentin twice daily for the next 5 days to finish treating aspiration pneumonia STOP taking plavix, but KEEP taking aspirin to limit risk of bleeding from gastric mass To treat hiccups, try baclofen every 4 hours as needed as directed. May also consider thorazine  Home Health: Home hospice Equipment/Devices: Portable oxygen, hospital bed, OBT, 02 concentrator, and transfer bench. All delivered prior to discharge  Discharge Condition: Guarded CODE STATUS: Full Diet recommendation: As tolerated  Brief/Interim Summary: 80 yo male with a history of CAD, PVD s/p RLE stent on ASA and plavix found to be severely anemic with evidence of GI bleed. Investigation revealed gastric fungating bleeding mass found on endoscopy, CT with liver mets. Pathology positive for invasive adenocarcinoma. Oncology was consulted and recommended comfort care given medical comorbidities and risk of systemic chemotherapy. Sepsis developed due to aspiration pneumonia treated with zosyn and transitioned to augmentin. No sepsis at discharge. He experienced abd pain due to ileus which spontaneously resolved. Discharge to hospice was recommended, but pt opts for home hospice services.   Discharge Diagnoses:  Principal Problem:   Symptomatic anemia Active Problems:   CAD, CABG X 3 9/09. Cath 2012 and 02/05/13- 2/3 grafts occluded- medical Rx   PVD - Rt SFA PTA 8/112   Dehydration   Esophageal dysphagia   Odynophagia   Malignancy (HCC)   Anemia   Failure to thrive in adult   Gastric adenocarcinoma  ([place])  1. Acute blood loss anemia due to upper GI bleed. Resumed patient on aspirin. Will continue to monitor cell count. Will change protonix vi to po.   2. Malignant gastric tumor . Large, necrotic, fungating, friable mass with oozing at the gastric cardia and fundus, pathology with invasive adenocarcinoma, abdomen CT with positive metastatic lesions. Patient and his family have decided to go home with hospice. Started fentanyl patch to decrease use of hydromorphone. Consider addition of breakthrough coverage, though pt has not required this much. Added topical oral lidocaine.   3. New AKI with dehydration with hypokalemia. Improved renal function with hydration  4. PVD status post stent on right lower extremity. Old records personally reviewed, stent placed last month, drug eluding stent/ balloon angioplasty.  Patient back on aspirin but not on plavix. Dr Kennon Holter nurse informed. Gastric lesion with high risk of bleeding per GI. No leg pain.   5. HTN. Systolic blood pressure  [id number] to 120, will continue metoprolol. Reactive tachycardia due to pain.   6. Sepsis, due to aspiration pneumonia, suspected to be present on admission.  IV fluids with d50.45% saline at 75 cc/hr, antibiotics with Zosyn IV. Continue thin liquid, clear diet per speech therapy. Changed to augmentin 11/1.  7. Ileus. Resolved, will continue miralax po. Tolerating clear diet.   Discharge Instructions Discharge Instructions    Discharge instructions    Complete by:  As directed    START taking fentanyl patch every 72 hours (next due to be placed 11/3).  START taking protonix twice daily to reduce risk of GI bleeding START taking augmentin twice daily for the next 5 days to finish treating aspiration pneumonia STOP taking plavix,  but KEEP taking aspirin To treat hiccups, try baclofen every 4 hours as needed as directed       Medication List    STOP taking these medications   clopidogrel 75 MG tablet Commonly  known as:  PLAVIX     TAKE these medications   acetaminophen 325 MG tablet Commonly known as:  TYLENOL Take 2 tablets (650 mg total) by mouth every 4 (four) hours as needed for headache or mild pain.   amoxicillin-clavulanate 875-125 MG tablet Commonly known as:  AUGMENTIN Take 1 tablet by mouth every 12 (twelve) hours.   aspirin EC 81 MG tablet Take 81 mg by mouth daily.   baclofen 10 MG tablet Commonly known as:  LIORESAL Take 0.5 tablets (5 mg total) by mouth every 4 (four) hours as needed (hiccups).   cholecalciferol 1000 units tablet Commonly known as:  VITAMIN D Take 1,000 Units by mouth daily.   fentaNYL 25 MCG/HR patch Commonly known as:  DURAGESIC - dosed mcg/hr Place 1 patch (25 mcg total) onto the skin every 3 (three) days. Start taking on:  06/16/2016   isosorbide mononitrate 30 MG 24 hr tablet Commonly known as:  IMDUR Take 1 tablet (30 mg total) by mouth daily.   metoprolol succinate 25 MG 24 hr tablet Commonly known as:  TOPROL XL Take 1 tablet (25 mg total) by mouth daily.   nitroGLYCERIN 0.4 MG SL tablet Commonly known as:  NITROSTAT Place 1 tablet (0.4 mg total) under the tongue every 5 (five) minutes as needed for chest pain.   pantoprazole 40 MG tablet Commonly known as:  PROTONIX Take 1 tablet (40 mg total) by mouth 2 (two) times daily.   pravastatin 40 MG tablet Commonly known as:  PRAVACHOL Take 1 tablet (40 mg total) by mouth every evening.   SYSTANE 0.4-0.3 % Soln Generic drug:  Polyethyl Glycol-Propyl Glycol Place 1 drop into both eyes 4 (four) times daily as needed (dry eyes).      Follow-up Information    Hospice at [facility]   Specialty:  Hospice and Palliative Medicine Why:  They will do your hospice care at your home Contact information: [place] [address] [phone]          No Known Allergies  Consultations:  Oncology, Dr. Lindi Adie  GI  Palliative care team  Procedures/Studies: Dg  Chest 2 View  Result Date: 06/07/2016 CLINICAL DATA:  Increased weakness, chest pain increased after eating, difficulty swallowing since discharge from hospital following cardiac stent procedure, hoarse voice, history hypertension, coronary artery disease post MI and stenting, prostate cancer EXAM: CHEST  2 VIEW COMPARISON:  04/04/2016 FINDINGS: Normal heart size post CABG. Mediastinal contours and pulmonary vascularity normal. Atherosclerotic calcification aorta. Lungs emphysematous but clear. No pleural effusion or pneumothorax. Bones unremarkable. IMPRESSION: Post CABG. COPD changes without infiltrate. Aortic atherosclerosis. Electronically Signed   By: Lavonia Dana M.D.   On: 06/07/2016 13:26   Dg Abd 1 View  Result Date: 06/12/2016 CLINICAL DATA:  Abdominal pain with nausea and vomiting for several weeks. Personal history of prostate carcinoma. EXAM: ABDOMEN - 1 VIEW COMPARISON:  06/11/2016 FINDINGS: There has been resolution of gaseous distention of small bowel and colon since previous study. Bowel gas pattern is within normal limits. Surgical clips again seen within pelvis and epigastric region. IMPRESSION: Normal bowel gas pattern.  No acute findings. Electronically Signed   By: Earle Gell M.D.   On: 06/12/2016 10:30   Dg Abd 1 View  Result  Date: 06/11/2016 CLINICAL DATA:  Abdominal pain. No free air, portal venous gas, or pneumatosis. EXAM: ABDOMEN - 1 VIEW COMPARISON:  CT scan from yesterday FINDINGS: Air-filled loops of large and small bowel with gas extending into the rectum may represent developing ileus. Contrast in the bladder is consistent with recent contrast-enhanced CT scan. No other significant abnormalities. IMPRESSION: Suggested developing ileus. Electronically Signed   By: Dorise Bullion III M.D   On: 06/11/2016 16:25   Ct Chest W Contrast  Result Date: 06/10/2016 CLINICAL DATA:  Malignant appearing gastric mass on upper endoscopy, dysphasia, weight loss, iron deficiency  anemia EXAM: CT CHEST AND ABDOMEN WITH CONTRAST TECHNIQUE: Multidetector CT imaging of the chest and abdomen was performed following the standard protocol during bolus administration of intravenous contrast. CONTRAST:  100 mL Isovue 300 IV COMPARISON:  CTA abdomen/ pelvis dated 08/28/2014 FINDINGS: CT CHEST FINDINGS Cardiovascular: Heart is top-normal in size. No pericardial effusion. Coronary atherosclerosis in the LAD. Postsurgical changes related to prior CABG. Mild atherosclerotic calcifications of the aortic arch. Mediastinum/Nodes: Small mediastinal lymph nodes, including a 7 mm short axis low right paratracheal node (series 2/ image 23) and an 8 mm short axis AP window node (series 2/ image 22), within the upper limits of normal. 9 mm short axis lower paraesophageal node (series 2/image 47), suspicious. Visualized thyroid is unremarkable. Fluid within the mid/distal esophagus. Lungs/Pleura: Patchy opacities in the lingula and bilateral lower lobes, left lower lobe predominant, suspicious for pneumonia. Superimposed atelectasis is possible. Associated 1.9 cm cavitary lesion in the right lower lobe (series 2/ image 41), likely reflecting an associated pulmonary abscess. Small bilateral pleural effusions. No suspicious pulmonary nodules. Underlying mild centrilobular and paraseptal emphysematous changes. No pneumothorax. Musculoskeletal: Stable bone island in the anterior T8 vertebral body (sagittal image 67). Median sternotomy. CT ABDOMEN FINDINGS Hepatobiliary: Multifocal hepatic metastases, at least 8 in number, most of which are in the right hepatic lobe. Dominant 3.1 cm metastasis in segment 7 (series 2/ image 48). A 2.0 cm metastasis is present in segment 2 (series 2/image 58). Gallbladder is unremarkable. No intrahepatic or extrahepatic ductal dilatation. Pancreas: Within normal limits. Spleen: Within normal limits. Adrenals/Urinary Tract: Adrenal glands are within normal limits. 11 mm left upper pole  renal cyst (series 2/ image 65). 6 mm right lower pole renal cyst (series 2/ image 76). No hydronephrosis. Stomach/Bowel: Wall thickening/ mass in the gastric cardia (series 2/ image 51), likely corresponding to the endoscopic lesion. Visualized bowel is unremarkable. Vascular/Lymphatic: No evidence abdominal aortic aneurysm. 10 mm short axis left para-aortic node (series 2/ image 68), suspicious. Other: No abdominal ascites. Musculoskeletal: Visualized lumbar spine is unremarkable. IMPRESSION: Wall thickening/ mass in the gastric cardia, corresponding to the suspected gastric mass on endoscopy. Multifocal hepatic metastases in both lobes, measuring up to 3.1 cm in segment 7. Suspected lower esophageal and left para-aortic nodal metastases. Multifocal patchy opacities in the lingula and bilateral lower lobes, suspicious for pneumonia. Suspected 1.9 cm pulmonary abscess in the right lower lobe. Small bilateral pleural effusions. Please note that the pelvis was not imaged. Electronically Signed   By: Julian Hy M.D.   On: 06/10/2016 13:41   Ct Abdomen W Contrast  Result Date: 06/10/2016 CLINICAL DATA:  Malignant appearing gastric mass on upper endoscopy, dysphasia, weight loss, iron deficiency anemia EXAM: CT CHEST AND ABDOMEN WITH CONTRAST TECHNIQUE: Multidetector CT imaging of the chest and abdomen was performed following the standard protocol during bolus administration of intravenous contrast. CONTRAST:  100 mL Isovue 300  IV COMPARISON:  CTA abdomen/ pelvis dated 08/28/2014 FINDINGS: CT CHEST FINDINGS Cardiovascular: Heart is top-normal in size. No pericardial effusion. Coronary atherosclerosis in the LAD. Postsurgical changes related to prior CABG. Mild atherosclerotic calcifications of the aortic arch. Mediastinum/Nodes: Small mediastinal lymph nodes, including a 7 mm short axis low right paratracheal node (series 2/ image 23) and an 8 mm short axis AP window node (series 2/ image 22), within the  upper limits of normal. 9 mm short axis lower paraesophageal node (series 2/image 47), suspicious. Visualized thyroid is unremarkable. Fluid within the mid/distal esophagus. Lungs/Pleura: Patchy opacities in the lingula and bilateral lower lobes, left lower lobe predominant, suspicious for pneumonia. Superimposed atelectasis is possible. Associated 1.9 cm cavitary lesion in the right lower lobe (series 2/ image 41), likely reflecting an associated pulmonary abscess. Small bilateral pleural effusions. No suspicious pulmonary nodules. Underlying mild centrilobular and paraseptal emphysematous changes. No pneumothorax. Musculoskeletal: Stable bone island in the anterior T8 vertebral body (sagittal image 67). Median sternotomy. CT ABDOMEN FINDINGS Hepatobiliary: Multifocal hepatic metastases, at least 8 in number, most of which are in the right hepatic lobe. Dominant 3.1 cm metastasis in segment 7 (series 2/ image 48). A 2.0 cm metastasis is present in segment 2 (series 2/image 58). Gallbladder is unremarkable. No intrahepatic or extrahepatic ductal dilatation. Pancreas: Within normal limits. Spleen: Within normal limits. Adrenals/Urinary Tract: Adrenal glands are within normal limits. 11 mm left upper pole renal cyst (series 2/ image 65). 6 mm right lower pole renal cyst (series 2/ image 76). No hydronephrosis. Stomach/Bowel: Wall thickening/ mass in the gastric cardia (series 2/ image 51), likely corresponding to the endoscopic lesion. Visualized bowel is unremarkable. Vascular/Lymphatic: No evidence abdominal aortic aneurysm. 10 mm short axis left para-aortic node (series 2/ image 68), suspicious. Other: No abdominal ascites. Musculoskeletal: Visualized lumbar spine is unremarkable. IMPRESSION: Wall thickening/ mass in the gastric cardia, corresponding to the suspected gastric mass on endoscopy. Multifocal hepatic metastases in both lobes, measuring up to 3.1 cm in segment 7. Suspected lower esophageal and left  para-aortic nodal metastases. Multifocal patchy opacities in the lingula and bilateral lower lobes, suspicious for pneumonia. Suspected 1.9 cm pulmonary abscess in the right lower lobe. Small bilateral pleural effusions. Please note that the pelvis was not imaged. Electronically Signed   By: Julian Hy M.D.   On: 06/10/2016 13:41    EGD on 10/27 with larger gastric mass.  Subjective: Pt feels "ok" with hiccups but no significant pain. Wants to be home.   Discharge Exam: Vitals:   06/13/16 2143 06/14/16 0442  BP: 136/75 120/62  Pulse: (!) 120 (!) 114  Resp: 20 19  Temp: 98.9 F (37.2 C) 98.9 F (37.2 C)   Vitals:   06/13/16 0448 06/13/16 1142 06/13/16 2143 06/14/16 0442  BP: 108/65 121/69 136/75 120/62  Pulse: (!) 119 (!) 113 (!) 120 (!) 114  Resp: 18 18 20 19   Temp: 99.8 F (37.7 C) 97.7 F (36.5 C) 98.9 F (37.2 C) 98.9 F (37.2 C)  TempSrc: Oral Oral Oral Oral  SpO2: 96% 100% 100% 99%  Weight: 55 kg (121 lb 4.1 oz)   55.6 kg (122 lb 8 oz)  Height:       General: Pt is alert, frail, in no distress Cardiovascular: RRR, S1/S2 +, no rubs, no gallops Respiratory: CTA bilaterally, no wheezing, no rhonchi Abdominal: Soft, NT, ND, bowel sounds + Extremities: no edema, no cyanosis  The results of significant diagnostics from this hospitalization (including imaging, microbiology, ancillary and  laboratory) are listed below for reference.    Microbiology: Recent Results (from the past 240 hour(s))  Culture, blood (Routine X 2) w Reflex to ID Panel     Status: None   Collection Time: 06/08/16 10:45 PM  Result Value Ref Range Status   Specimen Description BLOOD RIGHT ARM  Final   Special Requests BOTTLES DRAWN AEROBIC AND ANAEROBIC 5ML  Final   Culture NO GROWTH 5 DAYS  Final   Report Status 06/13/2016 FINAL  Final  Culture, blood (Routine X 2) w Reflex to ID Panel     Status: None   Collection Time: 06/08/16 10:49 PM  Result Value Ref Range Status   Specimen  Description BLOOD LEFT HAND  Final   Special Requests AEROBIC BOTTLE ONLY 5ML  Final   Culture NO GROWTH 5 DAYS  Final   Report Status 06/13/2016 FINAL  Final  Culture, blood (Routine X 2) w Reflex to ID Panel     Status: None (Preliminary result)   Collection Time: 06/10/16  5:54 PM  Result Value Ref Range Status   Specimen Description WRIST RIGHT  Final   Special Requests BOTTLES DRAWN AEROBIC AND ANAEROBIC 5CC  Final   Culture NO GROWTH 4 DAYS  Final   Report Status PENDING  Incomplete  Culture, blood (Routine X 2) w Reflex to ID Panel     Status: None (Preliminary result)   Collection Time: 06/10/16  5:54 PM  Result Value Ref Range Status   Specimen Description WRIST LEFT  Final   Special Requests BOTTLES DRAWN AEROBIC ONLY 3CC  Final   Culture NO GROWTH 4 DAYS  Final   Report Status PENDING  Incomplete     Labs: BNP (last 3 results) No results for input(s): BNP in the last 8760 hours. Basic Metabolic Panel:  Recent Labs Lab 06/10/16 0259 06/11/16 0435 06/12/16 0438 06/13/16 0436 06/14/16 0245  NA 140 137 134* 137 138  K 3.3* 4.1 4.4 3.4* 3.4*  CL 111 109 104 107 106  CO2 22 21* 22 23 24   GLUCOSE 167* 116* 184* 125* 131*  BUN 8 7 14 10 6   CREATININE 0.97 0.94 1.34* 1.08 0.93  CALCIUM 7.7* 8.1* 8.0* 7.8* 8.0*   Liver Function Tests: No results for input(s): AST, ALT, ALKPHOS, BILITOT, PROT, ALBUMIN in the last 168 hours. No results for input(s): LIPASE, AMYLASE in the last 168 hours. No results for input(s): AMMONIA in the last 168 hours. CBC:  Recent Labs Lab 06/10/16 0259 06/11/16 0435 06/12/16 0438 06/13/16 0436 06/14/16 0245  WBC 20.2* 19.4* 19.5* 14.8* 15.5*  NEUTROABS 17.6* 16.2* 16.3* 11.9* 12.4*  HGB 9.1* 9.6* 9.5* 8.2* 8.2*  HCT 28.6* 30.3* 29.6* 25.5* 25.9*  MCV 85.1 84.2 85.1 85.3 84.4  PLT 301 325 284 310 331   Cardiac Enzymes: No results for input(s): CKTOTAL, CKMB, CKMBINDEX, TROPONINI in the last 168 hours. BNP: Invalid input(s):  POCBNP CBG: No results for input(s): GLUCAP in the last 168 hours. D-Dimer No results for input(s): DDIMER in the last 72 hours. Hgb A1c No results for input(s): HGBA1C in the last 72 hours. Lipid Profile No results for input(s): CHOL, HDL, LDLCALC, TRIG, CHOLHDL, LDLDIRECT in the last 72 hours. Thyroid function studies No results for input(s): TSH, T4TOTAL, T3FREE, THYROIDAB in the last 72 hours.  Invalid input(s): FREET3 Anemia work up No results for input(s): VITAMINB12, FOLATE, FERRITIN, TIBC, IRON, RETICCTPCT in the last 72 hours. Urinalysis    Component Value Date/Time   COLORURINE  AMBER (A) 06/07/2016 1517   APPEARANCEUR CLOUDY (A) 06/07/2016 1517   LABSPEC 1.030 06/07/2016 1517   PHURINE 6.0 06/07/2016 1517   GLUCOSEU NEGATIVE 06/07/2016 1517   HGBUR TRACE (A) 06/07/2016 1517   BILIRUBINUR MODERATE (A) 06/07/2016 1517   KETONESUR 15 (A) 06/07/2016 1517   PROTEINUR 30 (A) 06/07/2016 1517   UROBILINOGEN 0.2 06/11/2013 1448   NITRITE NEGATIVE 06/07/2016 1517   LEUKOCYTESUR NEGATIVE 06/07/2016 1517   Sepsis Labs Invalid input(s): PROCALCITONIN,  WBC,  LACTICIDVEN Microbiology Recent Results (from the past 240 hour(s))  Culture, blood (Routine X 2) w Reflex to ID Panel     Status: None   Collection Time: 06/08/16 10:45 PM  Result Value Ref Range Status   Specimen Description BLOOD RIGHT ARM  Final   Special Requests BOTTLES DRAWN AEROBIC AND ANAEROBIC 5ML  Final   Culture NO GROWTH 5 DAYS  Final   Report Status 06/13/2016 FINAL  Final  Culture, blood (Routine X 2) w Reflex to ID Panel     Status: None   Collection Time: 06/08/16 10:49 PM  Result Value Ref Range Status   Specimen Description BLOOD LEFT HAND  Final   Special Requests AEROBIC BOTTLE ONLY 5ML  Final   Culture NO GROWTH 5 DAYS  Final   Report Status 06/13/2016 FINAL  Final  Culture, blood (Routine X 2) w Reflex to ID Panel     Status: None (Preliminary result)   Collection Time: 06/10/16  5:54 PM   Result Value Ref Range Status   Specimen Description WRIST RIGHT  Final   Special Requests BOTTLES DRAWN AEROBIC AND ANAEROBIC 5CC  Final   Culture NO GROWTH 4 DAYS  Final   Report Status PENDING  Incomplete  Culture, blood (Routine X 2) w Reflex to ID Panel     Status: None (Preliminary result)   Collection Time: 06/10/16  5:54 PM  Result Value Ref Range Status   Specimen Description WRIST LEFT  Final   Special Requests BOTTLES DRAWN AEROBIC ONLY 3CC  Final   Culture NO GROWTH 4 DAYS  Final   Report Status PENDING  Incomplete    Time coordinating discharge: Over 36 minutes  Vance Gather, MD  [facility] 06/14/2016, 3:42 PM Pager [phone]  If 7PM-7AM, please contact night-coverage [email or web address] Password [email or web address]

## 2016-06-14 NOTE — Progress Notes (Signed)
CM talked to patient about his hospital bed being delivered to his home today; he requested that I call his wife; CM called his home, talked to his son and he stated that the hospital bed was there along with the other DME; Attending MD paged for dc ordered. Mindi Slicker [facility] [phone]

## 2016-06-14 NOTE — Evaluation (Signed)
Physical Therapy Evaluation Patient Details Name: Tony Curtis MRN: [id number] DOB: 1936/08/09 Today's Date: 06/14/2016   History of Present Illness  Pt is a 80 y/o admitted secondary to anemia and found to have a malignant gastric tumor with mets to liver. Pt planning to d/c home with hospice. PMH including but not limited to CAD, HTN, PVD, prostate cancer, hx of MI in 2009 and CABG in 04/2008.  Clinical Impression  Pt presented supine in bed with HOB elevated, awake and willing to participate in therapy session after encouragement. Prior to admission, pt stated that he ambulated with use of SPC or RW within his home and community. Pt agitated throughout evaluation which limited his participation. Pt performed all functional mobility with min guard for safety, no physical assistance needed. Pt would continue to benefit from skilled physical therapy services at this time while admitted to address his below listed limitations in order to improve his overall safety and independence with functional mobility.      Follow Up Recommendations Supervision/Assistance - 24 hour;Other (comment) (pt d/c'ing with Hospice Care)    Equipment Recommendations  None recommended by PT    Recommendations for Other Services       Precautions / Restrictions Restrictions Weight Bearing Restrictions: No      Mobility  Bed Mobility Overal bed mobility: Modified Independent             General bed mobility comments: pt required increased time and HOB elevated  Transfers Overall transfer level: Needs assistance Equipment used: None Transfers: Sit to/from Stand Sit to Stand: Supervision         General transfer comment: pt a bit impulsive and standing prior to therapist donning gait belt, despite VC'ing for safety  Ambulation/Gait Ambulation/Gait assistance: Min guard Ambulation Distance (Feet): 20 Feet Assistive device: None Gait Pattern/deviations: Step-through pattern;Decreased step  length - right;Decreased step length - left;Decreased stride length Gait velocity: decreased   General Gait Details: pt with mild instability during ambulation, but no LOB  Stairs            Wheelchair Mobility    Modified Rankin (Stroke Patients Only)       Balance Overall balance assessment: Needs assistance Sitting-balance support: Feet supported;No upper extremity supported Sitting balance-Leahy Scale: Fair     Standing balance support: During functional activity;No upper extremity supported Standing balance-Leahy Scale: Fair                               Pertinent Vitals/Pain Pain Assessment: No/denies pain Pain Intervention(s): Monitored during session    Home Living Family/patient expects to be discharged to:: Private residence Living Arrangements: Spouse/significant other Available Help at Discharge: Family;Available 24 hours/day Type of Home: House         Home Equipment: Walker - 2 wheels;Cane - single point Additional Comments: limited information secondary to pt agitation    Prior Function Level of Independence: Independent with assistive device(s)         Comments: Prior to admission, pt reported that he ambulated with use of SPC and/or RW at all times     Hand Dominance        Extremity/Trunk Assessment   Upper Extremity Assessment: Generalized weakness           Lower Extremity Assessment: Generalized weakness         Communication   Communication: No difficulties  Cognition Arousal/Alertness: Awake/alert Behavior During Therapy: Agitated;Flat affect Overall  Cognitive Status: Within Functional Limits for tasks assessed                      General Comments      Exercises     Assessment/Plan    PT Assessment Patient needs continued PT services  PT Problem List Decreased strength;Decreased activity tolerance;Decreased balance;Decreased mobility;Decreased coordination;Decreased knowledge of use of  DME;Decreased safety awareness          PT Treatment Interventions DME instruction;Gait training;Stair training;Functional mobility training;Therapeutic activities;Therapeutic exercise;Balance training;Neuromuscular re-education;Patient/family education    PT Goals (Current goals can be found in the Care Plan section)  Acute Rehab PT Goals Patient Stated Goal: return home today PT Goal Formulation: With patient Time For Goal Achievement: 06/21/16 Potential to Achieve Goals: Fair    Frequency Min 3X/week   Barriers to discharge        Co-evaluation               End of Session   Activity Tolerance: Patient limited by fatigue Patient left: in bed;with call bell/phone within reach Nurse Communication: Mobility status;Other (comment) (pt sitting EOB with lunch tray)         Time: [id number] PT Time Calculation (min) (ACUTE ONLY): 10 min   Charges:   PT Evaluation $PT Eval Moderate Complexity: 1 Procedure     PT G CodesClearnce Sorrel Daphne Karrer 06/14/2016, 1:29 PM Sherie Don, PT, DPT [phone]

## 2016-06-15 ENCOUNTER — Telehealth: Payer: Self-pay | Admitting: *Deleted

## 2016-06-15 LAB — CULTURE, BLOOD (ROUTINE X 2)
CULTURE: NO GROWTH
Culture: NO GROWTH

## 2016-06-15 NOTE — Telephone Encounter (Signed)
Received call back from on-call RN at 1749.  Orders given per Dr. Geralyn Flash request.  All questions answered.  No further questions or concerns at this time.  Hospice RN reports she will enter orders and touch base with pt to see how he is feeling.

## 2016-06-15 NOTE — Telephone Encounter (Signed)
Hospital Consult on 06-13-2016 Hospice calling for orders.  "Patient needs anti-emetic.  Not able to take augmentin due to N/V.  Also note he has not had a BM in two weeks.  Please call Vickie RN with Hospice at [phone].  If any orders after 5:00 pm, call [phone] asking for the On-call nurse."

## 2016-06-15 NOTE — Telephone Encounter (Signed)
Reviewed with Dr. Lindi Adie who gave the following orders:  1. Zofran ODT 4mg  every 6 hours as needed for nausea 2. Miralax every 8 hours as needed for constipation. 3. Perform soap suds enema x 1.  Called number given for on-call RN after 5pm. Contact information given for return phone call.

## 2016-06-20 NOTE — Telephone Encounter (Signed)
He recently had a peripheral intervention of proximal one month ago however given his recent bleeding and the fact that he has a stomach malignancy and is on hospice he gets reasonable to discontinue the Plavix.

## 2016-06-21 NOTE — Telephone Encounter (Signed)
Routing to Dr Cathlean Sauer as an Juluis Rainier.

## 2016-06-26 ENCOUNTER — Telehealth: Payer: Self-pay

## 2016-06-26 ENCOUNTER — Emergency Department (HOSPITAL_COMMUNITY)

## 2016-06-26 ENCOUNTER — Encounter (HOSPITAL_COMMUNITY): Payer: Self-pay | Admitting: Emergency Medicine

## 2016-06-26 ENCOUNTER — Inpatient Hospital Stay (HOSPITAL_COMMUNITY)
Admission: EM | Admit: 2016-06-26 | Discharge: 2016-06-29 | DRG: 374 | Attending: Internal Medicine | Admitting: Internal Medicine

## 2016-06-26 DIAGNOSIS — D649 Anemia, unspecified: Secondary | ICD-10-CM | POA: Diagnosis present

## 2016-06-26 DIAGNOSIS — Z951 Presence of aortocoronary bypass graft: Secondary | ICD-10-CM

## 2016-06-26 DIAGNOSIS — G8929 Other chronic pain: Secondary | ICD-10-CM | POA: Diagnosis present

## 2016-06-26 DIAGNOSIS — I25700 Atherosclerosis of coronary artery bypass graft(s), unspecified, with unstable angina pectoris: Secondary | ICD-10-CM | POA: Diagnosis not present

## 2016-06-26 DIAGNOSIS — C787 Secondary malignant neoplasm of liver and intrahepatic bile duct: Secondary | ICD-10-CM | POA: Diagnosis present

## 2016-06-26 DIAGNOSIS — Z9842 Cataract extraction status, left eye: Secondary | ICD-10-CM

## 2016-06-26 DIAGNOSIS — D500 Iron deficiency anemia secondary to blood loss (chronic): Secondary | ICD-10-CM | POA: Diagnosis present

## 2016-06-26 DIAGNOSIS — I25708 Atherosclerosis of coronary artery bypass graft(s), unspecified, with other forms of angina pectoris: Secondary | ICD-10-CM | POA: Diagnosis not present

## 2016-06-26 DIAGNOSIS — I10 Essential (primary) hypertension: Secondary | ICD-10-CM | POA: Diagnosis present

## 2016-06-26 DIAGNOSIS — E43 Unspecified severe protein-calorie malnutrition: Secondary | ICD-10-CM | POA: Diagnosis present

## 2016-06-26 DIAGNOSIS — M1990 Unspecified osteoarthritis, unspecified site: Secondary | ICD-10-CM | POA: Diagnosis present

## 2016-06-26 DIAGNOSIS — Z681 Body mass index (BMI) 19 or less, adult: Secondary | ICD-10-CM

## 2016-06-26 DIAGNOSIS — E860 Dehydration: Secondary | ICD-10-CM | POA: Diagnosis present

## 2016-06-26 DIAGNOSIS — Z809 Family history of malignant neoplasm, unspecified: Secondary | ICD-10-CM

## 2016-06-26 DIAGNOSIS — Z79899 Other long term (current) drug therapy: Secondary | ICD-10-CM

## 2016-06-26 DIAGNOSIS — E876 Hypokalemia: Secondary | ICD-10-CM | POA: Diagnosis present

## 2016-06-26 DIAGNOSIS — I2510 Atherosclerotic heart disease of native coronary artery without angina pectoris: Secondary | ICD-10-CM | POA: Diagnosis present

## 2016-06-26 DIAGNOSIS — I739 Peripheral vascular disease, unspecified: Secondary | ICD-10-CM | POA: Diagnosis present

## 2016-06-26 DIAGNOSIS — J449 Chronic obstructive pulmonary disease, unspecified: Secondary | ICD-10-CM | POA: Diagnosis present

## 2016-06-26 DIAGNOSIS — R000 Tachycardia, unspecified: Secondary | ICD-10-CM | POA: Diagnosis present

## 2016-06-26 DIAGNOSIS — K219 Gastro-esophageal reflux disease without esophagitis: Secondary | ICD-10-CM | POA: Diagnosis present

## 2016-06-26 DIAGNOSIS — Z9889 Other specified postprocedural states: Secondary | ICD-10-CM

## 2016-06-26 DIAGNOSIS — M549 Dorsalgia, unspecified: Secondary | ICD-10-CM | POA: Diagnosis present

## 2016-06-26 DIAGNOSIS — E1151 Type 2 diabetes mellitus with diabetic peripheral angiopathy without gangrene: Secondary | ICD-10-CM | POA: Diagnosis present

## 2016-06-26 DIAGNOSIS — C169 Malignant neoplasm of stomach, unspecified: Secondary | ICD-10-CM | POA: Diagnosis present

## 2016-06-26 DIAGNOSIS — C799 Secondary malignant neoplasm of unspecified site: Secondary | ICD-10-CM

## 2016-06-26 DIAGNOSIS — Z961 Presence of intraocular lens: Secondary | ICD-10-CM | POA: Diagnosis present

## 2016-06-26 DIAGNOSIS — Z66 Do not resuscitate: Secondary | ICD-10-CM | POA: Diagnosis present

## 2016-06-26 DIAGNOSIS — R066 Hiccough: Secondary | ICD-10-CM | POA: Diagnosis present

## 2016-06-26 DIAGNOSIS — Z8546 Personal history of malignant neoplasm of prostate: Secondary | ICD-10-CM | POA: Diagnosis not present

## 2016-06-26 DIAGNOSIS — Z8249 Family history of ischemic heart disease and other diseases of the circulatory system: Secondary | ICD-10-CM

## 2016-06-26 DIAGNOSIS — R1314 Dysphagia, pharyngoesophageal phase: Secondary | ICD-10-CM | POA: Diagnosis present

## 2016-06-26 DIAGNOSIS — Z87891 Personal history of nicotine dependence: Secondary | ICD-10-CM

## 2016-06-26 DIAGNOSIS — Z7982 Long term (current) use of aspirin: Secondary | ICD-10-CM

## 2016-06-26 DIAGNOSIS — R1319 Other dysphagia: Secondary | ICD-10-CM

## 2016-06-26 DIAGNOSIS — I252 Old myocardial infarction: Secondary | ICD-10-CM | POA: Diagnosis not present

## 2016-06-26 DIAGNOSIS — Z955 Presence of coronary angioplasty implant and graft: Secondary | ICD-10-CM

## 2016-06-26 DIAGNOSIS — I25110 Atherosclerotic heart disease of native coronary artery with unstable angina pectoris: Secondary | ICD-10-CM | POA: Diagnosis present

## 2016-06-26 DIAGNOSIS — R1310 Dysphagia, unspecified: Secondary | ICD-10-CM

## 2016-06-26 DIAGNOSIS — Z9841 Cataract extraction status, right eye: Secondary | ICD-10-CM

## 2016-06-26 DIAGNOSIS — Z7952 Long term (current) use of systemic steroids: Secondary | ICD-10-CM

## 2016-06-26 LAB — CBC WITH DIFFERENTIAL/PLATELET
BASOS ABS: 0 10*3/uL (ref 0.0–0.1)
BASOS PCT: 0 %
EOS ABS: 0 10*3/uL (ref 0.0–0.7)
Eosinophils Relative: 0 %
HCT: 23.1 % — ABNORMAL LOW (ref 39.0–52.0)
Hemoglobin: 6.9 g/dL — CL (ref 13.0–17.0)
LYMPHS ABS: 1.1 10*3/uL (ref 0.7–4.0)
LYMPHS PCT: 8 %
MCH: 25.6 pg — ABNORMAL LOW (ref 26.0–34.0)
MCHC: 29.9 g/dL — AB (ref 30.0–36.0)
MCV: 85.6 fL (ref 78.0–100.0)
Monocytes Absolute: 1.5 10*3/uL — ABNORMAL HIGH (ref 0.1–1.0)
Monocytes Relative: 11 %
NEUTROS ABS: 11 10*3/uL — AB (ref 1.7–7.7)
Neutrophils Relative %: 81 %
PLATELETS: 294 10*3/uL (ref 150–400)
RBC: 2.7 MIL/uL — ABNORMAL LOW (ref 4.22–5.81)
RDW: 17.2 % — ABNORMAL HIGH (ref 11.5–15.5)
WBC: 13.6 10*3/uL — ABNORMAL HIGH (ref 4.0–10.5)

## 2016-06-26 LAB — PREPARE RBC (CROSSMATCH)

## 2016-06-26 LAB — BASIC METABOLIC PANEL
ANION GAP: 12 (ref 5–15)
BUN: 25 mg/dL — AB (ref 6–20)
CALCIUM: 8.9 mg/dL (ref 8.9–10.3)
CO2: 27 mmol/L (ref 22–32)
Chloride: 108 mmol/L (ref 101–111)
Creatinine, Ser: 0.69 mg/dL (ref 0.61–1.24)
GFR calc Af Amer: >60 mL/min (ref >60)
GLUCOSE: 136 mg/dL — AB (ref 65–99)
Potassium: 3.3 mmol/L — ABNORMAL LOW (ref 3.5–5.1)
Sodium: 147 mmol/L — ABNORMAL HIGH (ref 135–145)

## 2016-06-26 LAB — CBG MONITORING, ED: GLUCOSE-CAPILLARY: 123 mg/dL — AB (ref 65–99)

## 2016-06-26 LAB — ABO/RH: ABO/RH(D): AB POS

## 2016-06-26 MED ORDER — SODIUM CHLORIDE 0.9 % IV SOLN
10.0000 mL/h | Freq: Once | INTRAVENOUS | Status: AC
  Administered 2016-06-26: 10 mL/h via INTRAVENOUS

## 2016-06-26 MED ORDER — FENTANYL 25 MCG/HR TD PT72
25.0000 ug | MEDICATED_PATCH | TRANSDERMAL | Status: DC
  Administered 2016-06-27: 25 ug via TRANSDERMAL
  Filled 2016-06-26: qty 1

## 2016-06-26 MED ORDER — HYDROCODONE-ACETAMINOPHEN 5-325 MG PO TABS
1.0000 | ORAL_TABLET | ORAL | Status: DC | PRN
  Filled 2016-06-26: qty 1

## 2016-06-26 MED ORDER — ACETAMINOPHEN 325 MG PO TABS
650.0000 mg | ORAL_TABLET | Freq: Four times a day (QID) | ORAL | Status: DC | PRN
Start: 2016-06-26 — End: 2016-06-29

## 2016-06-26 MED ORDER — SODIUM CHLORIDE 0.9 % IV SOLN: INTRAVENOUS | Status: AC

## 2016-06-26 MED ORDER — DIPHENHYDRAMINE HCL 25 MG PO CAPS: 25.0000 mg | ORAL_CAPSULE | Freq: Once | ORAL | Status: DC

## 2016-06-26 MED ORDER — SODIUM CHLORIDE 0.9 % IV SOLN
INTRAVENOUS | Status: DC
  Administered 2016-06-26 – 2016-06-27 (×2): via INTRAVENOUS

## 2016-06-26 MED ORDER — ONDANSETRON HCL 4 MG/2ML IJ SOLN
4.0000 mg | Freq: Four times a day (QID) | INTRAMUSCULAR | Status: DC | PRN
  Administered 2016-06-27: 4 mg via INTRAVENOUS
  Filled 2016-06-26: qty 2

## 2016-06-26 MED ORDER — SODIUM CHLORIDE 0.9 % IV SOLN
Freq: Once | INTRAVENOUS | Status: AC
  Administered 2016-06-27: via INTRAVENOUS

## 2016-06-26 MED ORDER — ASPIRIN EC 81 MG PO TBEC
81.0000 mg | DELAYED_RELEASE_TABLET | Freq: Every day | ORAL | Status: DC
  Filled 2016-06-26: qty 1

## 2016-06-26 MED ORDER — SODIUM CHLORIDE 0.9% FLUSH
3.0000 mL | Freq: Two times a day (BID) | INTRAVENOUS | Status: DC
  Administered 2016-06-27 (×2): 3 mL via INTRAVENOUS

## 2016-06-26 MED ORDER — ACETAMINOPHEN 650 MG RE SUPP: 650.0000 mg | Freq: Four times a day (QID) | RECTAL | Status: DC | PRN

## 2016-06-26 MED ORDER — ACETAMINOPHEN 325 MG PO TABS: 650.0000 mg | ORAL_TABLET | Freq: Once | ORAL | Status: DC

## 2016-06-26 MED ORDER — METOPROLOL SUCCINATE ER 25 MG PO TB24
25.0000 mg | ORAL_TABLET | Freq: Every day | ORAL | Status: DC
  Filled 2016-06-26: qty 1

## 2016-06-26 MED ORDER — ENSURE ENLIVE PO LIQD
237.0000 mL | Freq: Two times a day (BID) | ORAL | Status: DC
  Administered 2016-06-27: 237 mL via ORAL

## 2016-06-26 MED ORDER — ONDANSETRON HCL 4 MG PO TABS: 4.0000 mg | ORAL_TABLET | Freq: Four times a day (QID) | ORAL | Status: DC | PRN

## 2016-06-26 MED ORDER — HYDROMORPHONE HCL 1 MG/ML IJ SOLN
1.0000 mg | Freq: Once | INTRAMUSCULAR | Status: AC
  Administered 2016-06-26: 1 mg via INTRAVENOUS
  Filled 2016-06-26: qty 1

## 2016-06-26 NOTE — ED Notes (Signed)
Patients family meeting in ED conference room to discuss patient care. They are interested in speaking with someone regarding palliative care.

## 2016-06-26 NOTE — ED Provider Notes (Signed)
[facility] DEPT Provider Note   CSN: [id number] Arrival date & time: 06/26/16  1729     History   Chief Complaint Chief Complaint  Patient presents with  . Generalized Pain  . Generalized Weakness    HPI Tony Curtis is a 80 y.o. male.  HPI Patient has terminal gastric cancer with metastatic liver involvement. Patient is under hospice care but remains full code. He has had increasing weakness and epigastric pain. He does have a fentanyl patch but still has been suffering pain. He also reports is becoming very difficult for him to eat or drink anything. He reports it will go down but then shortly later comes back up again. He does report he has been coughing up blood but that has been ongoing now for at least several weeks. No known fever. He reports he has just enough strength to go to the bathroom and back. Past Medical History:  Diagnosis Date  . Abnormal EKG, resolved with slower HR 06/05/2013  . Arthritis    "about my whole body"  . CAD (coronary artery disease)    CABG 9/09. Cath 2012 and 02/05/13  . Chronic back pain    "mostly between my shoulders" (01/04/2015)  . Daily headache    "mostly at night; think it's because of the shingles I had" (01/04/2015)  . Gastric tumor    EGD 06/09/16  . GERD (gastroesophageal reflux disease)   . H/O echocardiogram 07/08/2008   EF 40% by 2D '09. EF 60% cath 6/14  . HTN (hypertension)   . Hyperlipemia   . Myocardial infarction 2009  . Prostate cancer ([place])   . PVD (peripheral vascular disease) ([place])    LE dopp (10/19/11) patent R SFA stent, R ant tib occluded with reconstitution within the dorsalis pedis, L SFA 70-99%, L ant tib occluded with reconstitution of flow in the dorsalis pedis, bil ABI 1.2; carotid dopp (04-27-08)-normal  . UTI (urinary tract infection) 06/05/2013    Patient Active Problem List   Diagnosis Date Noted  . Metastasis from gastric cancer ([place])   . Failure to thrive in adult   . Gastric adenocarcinoma  ([place])   . Malignancy ([place])   . Symptomatic anemia 06/07/2016  . Dehydration 06/07/2016  . Esophageal dysphagia 06/07/2016  . Odynophagia 06/07/2016  . Claudication ([place]) 01/04/2015  . Coronary artery disease involving coronary bypass graft of native heart with unstable angina pectoris ([place])   . Protein-calorie malnutrition, severe, he is on chronic steroids 02/05/2013  . HTN (hypertension)   . Hyperlipemia   . CAD, CABG X 3 9/09. Cath 2012 and 02/05/13- 2/3 grafts occluded- medical Rx   . Prostate cancer ([place])   . PVD - Rt SFA PTA 8/112     Past Surgical History:  Procedure Laterality Date  . ABDOMINAL ANGIOGRAM Left 10/13/08   LSFA PTA  . ABDOMINAL ANGIOGRAM Right 07/28/2008   RSFA  . ANGIOPLASTY / STENTING FEMORAL Right 01/04/2015  . CARDIAC CATHETERIZATION  07/04/2011   patent LIMA to diag branch, occluded ben grafts to diag and ramus branch; EF >60%   . CARDIAC CATHETERIZATION  03/20/2008   occluded prox LAD with great collaterals from the ramus and the circ and RCA to LAD; EF 45-50%, med tx  . CARDIAC CATHETERIZATION  02/05/13   SVGs occluded, no change  . CARDIAC CATHETERIZATION N/A 01/04/2015   Procedure: Left Heart Cath and Cors/Grafts Angiography;  Surgeon: Lorretta Harp, MD;  Location: [facility];  Service: Cardiovascular;  Laterality:  N/A;  . CATARACT EXTRACTION W/ INTRAOCULAR LENS  IMPLANT, BILATERAL Bilateral   . CORONARY ARTERY BYPASS GRAFT  05/06/2008   x3, LIMA to LAD, SVG to diad, SVG to ramus inermediate  . ESOPHAGOGASTRODUODENOSCOPY (EGD) WITH PROPOFOL N/A 06/09/2016   Procedure: ESOPHAGOGASTRODUODENOSCOPY (EGD) WITH PROPOFOL;  Surgeon: Doran Stabler, MD;  Location: [facility];  Service: Endoscopy;  Laterality: N/A;  . FRACTURE SURGERY    . KNEE DISLOCATION SURGERY Right    "they cut it"  . LEFT HEART CATHETERIZATION WITH CORONARY ANGIOGRAM N/A 02/05/2013   Procedure: LEFT HEART CATHETERIZATION WITH CORONARY ANGIOGRAM;  Surgeon: Lorretta Harp,  MD;  Location: [facility] CATH LAB;  Service: Cardiovascular;  Laterality: N/A;  . LEFT HEART CATHETERIZATION WITH CORONARY/GRAFT ANGIOGRAM  07/04/2011   Procedure: LEFT HEART CATHETERIZATION WITH Beatrix Fetters;  Surgeon: Lorretta Harp, MD;  Location: [facility] CATH LAB;  Service: Cardiovascular;;  . LEFT HEART CATHETERIZATION WITH CORONARY/GRAFT ANGIOGRAM  02/05/2013   Procedure: LEFT HEART CATHETERIZATION WITH Beatrix Fetters;  Surgeon: Lorretta Harp, MD;  Location[facility] C[facility] LAB;  Service: Cardiovascular;;  . PERIPHERAL VASCULAR CATHETERIZATION N/A 01/04/2015   Procedure: Lower Extremity Angiography;  Surgeon: Lorretta Harp, MD;  Location: [facility];  Service: Cardiovascular;  Laterality: N/A;  . PERIPHERAL VASCULAR CATHETERIZATION N/A 05/11/2016   Procedure: Lower Extremity Angiography;  Surgeon: Lorretta Harp, MD;  Location: [facility];  Service: Cardiovascular;  Laterality: N/A;  . PERIPHERAL VASCULAR CATHETERIZATION Right 05/11/2016   Procedure: Peripheral Vascular Balloon Angioplasty;  Surgeon: Lorretta Harp, MD;  Location: [facility];  Service: Cardiovascular;  Laterality: Right;  SFA  . PROSTATECTOMY    . [place]   "football injury"  . TONSILLECTOMY  1940's       Home Medications    Prior to Admission medications   Medication Sig Start Date End Date Taking? Authorizing Provider  acetaminophen (TYLENOL) 325 MG tablet Take 2 tablets (650 mg total) by mouth every 4 (four) hours as needed for headache or mild pain. 01/05/15  Yes Luke K Kilroy, PA-C  baclofen (LIORESAL) 10 MG tablet Take 0.5 tablets (5 mg total) by mouth every 4 (four) hours as needed (hiccups). 06/14/16  Yes Patrecia Pour, MD  fentaNYL (DURAGESIC - DOSED MCG/HR) 25 MCG/HR patch Place 1 patch (25 mcg total) onto the skin every 3 (three) days. 06/16/16  Yes Patrecia Pour, MD  Polyethyl Glycol-Propyl Glycol (SYSTANE) 0.4-0.3 % SOLN Place 1 drop into both eyes 4  (four) times daily as needed (dry eyes).   Yes Historical Provider, MD  menthol-cetylpyridinium (CEPACOL) 3 MG lozenge Take 1 lozenge (3 mg total) by mouth as needed for sore throat. 06/29/16   Debbe Odea, MD    Family History Family History  Problem Relation Age of Onset  . Adopted: Yes  . Coronary artery disease Sister   . Coronary artery disease Father 47  . Cancer Mother 38    Social History Social History  Substance Use Topics  . Smoking status: Former Smoker    Packs/day: 0.10    Years: 20.00    Types: Cigarettes    Quit date: 06/14/1979  . Smokeless tobacco: Never Used  . Alcohol use Yes     Comment: 01/04/2015 "might have a glass of wine q couple years"     Allergies   Patient has no known allergies.   Review of Systems Review of Systems  10 Systems reviewed and are negative  for acute change except as noted in the HPI.  Physical Exam Updated Vital Signs BP 117/69 (BP Location: Right Arm)   Pulse (!) 119   Temp 99.8 F (37.7 C) (Oral)   Resp 18   Ht 5\' 10"  (1.778 m)   Wt 102 lb 11.8 oz (46.6 kg)   SpO2 98%   BMI 14.74 kg/m   Physical Exam  Constitutional: He is oriented to person, place, and time.  Patient is cachectic and pale. No respiratory distress. Mental status clear.  HENT:  Head: Normocephalic and atraumatic.  Oral mucosa very pale.  Eyes: EOM are normal.  Cardiovascular:  Mild tachycardia. No gross rub murmur gallop.  Pulmonary/Chest:  No respiratory distress at rest. Breath sounds are soft. No gross wheeze rhonchi or rail.  Abdominal:  Tenderness and firmness in the epigastrium.  Musculoskeletal:  Extremities are cachectic. Patient does not have peripheral edema of the lower extremity is. No calf tenderness  Neurological: He is alert and oriented to person, place, and time. He exhibits normal muscle tone. Coordination normal.  Skin: Skin is warm and dry. There is pallor.     ED Treatments / Results  Labs (all labs ordered are  listed, but only abnormal results are displayed) Labs Reviewed  BASIC METABOLIC PANEL - Abnormal; Notable for the following:       Result Value   Sodium 147 (*)    Potassium 3.3 (*)    Glucose, Bld 136 (*)    BUN 25 (*)    All other components within normal limits  CBC WITH DIFFERENTIAL/PLATELET - Abnormal; Notable for the following:    WBC 13.6 (*)    RBC 2.70 (*)    Hemoglobin 6.9 (*)    HCT 23.1 (*)    MCH 25.6 (*)    MCHC 29.9 (*)    RDW 17.2 (*)    Neutro Abs 11.0 (*)    Monocytes Absolute 1.5 (*)    All other components within normal limits  COMPREHENSIVE METABOLIC PANEL - Abnormal; Notable for the following:    Sodium 151 (*)    Chloride 113 (*)    Glucose, Bld 138 (*)    BUN 27 (*)    Calcium 8.7 (*)    Total Protein 5.8 (*)    Albumin 2.3 (*)    ALT 13 (*)    Alkaline Phosphatase 269 (*)    Total Bilirubin 1.5 (*)    All other components within normal limits  CBC - Abnormal; Notable for the following:    WBC 13.0 (*)    RBC 3.41 (*)    Hemoglobin 9.1 (*)    HCT 28.2 (*)    RDW 15.7 (*)    All other components within normal limits  CBC WITH DIFFERENTIAL/PLATELET - Abnormal; Notable for the following:    WBC 11.1 (*)    RBC 2.92 (*)    Hemoglobin 7.8 (*)    HCT 24.4 (*)    RDW 16.3 (*)    Neutro Abs 8.6 (*)    Monocytes Absolute 1.2 (*)    All other components within normal limits  COMPREHENSIVE METABOLIC PANEL - Abnormal; Notable for the following:    Sodium 152 (*)    Potassium 3.1 (*)    Chloride 115 (*)    Glucose, Bld 163 (*)    BUN 21 (*)    Calcium 8.6 (*)    Total Protein 5.3 (*)    Albumin 2.1 (*)    ALT 13 (*)  Alkaline Phosphatase 226 (*)    All other components within normal limits  PHOSPHORUS - Abnormal; Notable for the following:    Phosphorus 2.2 (*)    All other components within normal limits  GLUCOSE, CAPILLARY - Abnormal; Notable for the following:    Glucose-Capillary 138 (*)    All other components within normal limits    CBG MONITORING, ED - Abnormal; Notable for the following:    Glucose-Capillary 123 (*)    All other components within normal limits  MAGNESIUM  PHOSPHORUS  TSH  MAGNESIUM  PREPARE RBC (CROSSMATCH)  TYPE AND SCREEN  ABO/RH    EKG  EKG Interpretation None       Radiology No results found.  Procedures Procedures (including critical care time)  Medications Ordered in ED Medications  0.9 %  sodium chloride infusion ( Intravenous Not Given 06/26/16 2315)  HYDROmorphone (DILAUDID) injection 1 mg (1 mg Intravenous Given 06/26/16 1758)  0.9 %  sodium chloride infusion (10 mL/hr Intravenous New Bag/Given 06/26/16 2025)  0.9 %  sodium chloride infusion ( Intravenous New Bag/Given 06/27/16 0010)  HYDROmorphone (DILAUDID) injection 1 mg (1 mg Intravenous Given 06/27/16 0923)  ondansetron (ZOFRAN) injection 4 mg (4 mg Intravenous Given 06/29/16 1136)     Initial Impression / Assessment and Plan / ED Course  I have reviewed the triage vital signs and the nursing notes.  Pertinent labs & imaging results that were available during my care of the patient were reviewed by me and considered in my medical decision making (see chart for details).  Clinical Course       Final Clinical Impressions(s) / ED Diagnoses   Final diagnoses:  Symptomatic anemia  Metastasis from gastric cancer [facility])   Patient be admitted for weakness and symptomatically anemia with severe comorbid illness. New Prescriptions Discharge Medication List as of 06/29/2016  1:22 PM    START taking these medications   Details  menthol-cetylpyridinium (CEPACOL) 3 MG lozenge Take 1 lozenge (3 mg total) by mouth as needed for sore throat., Starting Thu 06/29/2016, Normal         Charlesetta Shanks, MD 07/04/16 1430

## 2016-06-26 NOTE — ED Notes (Signed)
Family discussing care with patient in room for a moment before patient is transported to floor.

## 2016-06-26 NOTE — ED Notes (Signed)
Per Hospice, Pt being sent in for symptom management.  Hx of gastric CA w/ generalized mets and terminal diagnosis.  Hospice RN reports Pt is a full code.  Sts they discussed comfort care w/ family today, but they were not receptive.     Hospice On-call [phone]

## 2016-06-26 NOTE — ED Notes (Signed)
Chaplain at bedside

## 2016-06-26 NOTE — ED Triage Notes (Signed)
Per EMS pt request evaluation for generalized pain and weakness. Pt decreased I/O since Friday.

## 2016-06-26 NOTE — ED Notes (Signed)
Bed: [id number] Expected date:  Expected time:  Means of arrival:  Comments: EMS- [place]

## 2016-06-26 NOTE — H&P (Signed)
Tony Curtis [id number] DOB: March 16, 1936 DOA: 06/26/2016     PCP: Pcp Not In System   Outpatient Specialists: cardiology Ellyn Hack,  Patient coming from:    home Lives  With family    Chief Complaint: Significant fatigue  HPI: Tony Curtis is a 80 y.o. 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 The plan was for patient to be transferred to [facility] place  when bed was available. Examination tremor remains full code at this point Family is not receptive to discussion of comfort care. Given that this is a new diagnosis. He has not been able to take his Augmentin and not able to take metoprolol.   Patient has progressively been doing worse he's been having significant fatigue and enable to tolerate by mouth intake Regarding pertinent Chronic problems: Patient has been having chronic blood loss and significant weight loss in October 27 he undergone upper endoscopy by Dr. Loletha Carrow showing gastric fungating bleeding mass CT scan of abdomen showed liver metastases pathology was positive for invasive adenocarcinoma oncology has been consulted during his prior admission in October and recommended comfort care given medical comorbidities and risk of systemic chemotherapy. Patient's hospitalization was complicated by sepsis due to aspiration pneumonia. Patient was discharged to hospice. Their recommendations his cardiologist is Plavix has been discontinued and he was controlled with fentanyl patch. Reports pain is under good control. Family reports melena and profound weight loss.   Of note patient has known history of  peripheral vascular disease status post stent to right lower extremity which was placed last month it's a drug-eluting stent patient is current on aspirin only secondary to high risk of bleeding IN ER:  Temp (24hrs), Avg:98.3 F (36.8 C), Min:98.1 F (36.7 C), Max:98.5 F (36.9 C) Respirations 16 satting  100% 2 L heart rate 109 blood pressure 141/87 Na 147, K 3.3 Cr 0.69 WBC 13.6 Hg 6.9  Chest x-ray showing small bilateral pleural effusions and COPD Following Medications were ordered in ER: Medications  0.9 %  sodium chloride infusion ( Intravenous New Bag/Given 06/26/16 1758)  0.9 %  sodium chloride infusion (not administered)  HYDROmorphone (DILAUDID) injection 1 mg (1 mg Intravenous Given 06/26/16 1758)       Hospitalist was called for admission for Symptomatic anemia and dehydration  Review of Systems:    Pertinent positives include: abdominal pain, nausea, vomiting  Constitutional:  No weight loss, night sweats, Fevers, chills, fatigue, weight loss  HEENT:  No headaches, Difficulty swallowing,Tooth/dental problems,Sore throat,  No sneezing, itching, ear ache, nasal congestion, post nasal drip,  Cardio-vascular:  No chest pain, Orthopnea, PND, anasarca, dizziness, palpitations.no Bilateral lower extremity swelling  GI:  No heartburn, indigestion, , diarrhea, change in bowel habits, loss of appetite, melena, blood in stool, hematemesis Resp:  no shortness of breath at rest. No dyspnea on exertion, No excess mucus, no productive cough, No non-productive cough, No coughing up of blood.No change in color of mucus.No wheezing. Skin:  no rash or lesions. No jaundice GU:  no dysuria, change in color of urine, no urgency or frequency. No straining to urinate.  No flank pain.  Musculoskeletal:  No joint pain or no joint swelling. No decreased range of motion. No back pain.  Psych:  No change in mood or affect. No depression or anxiety. No memory loss.  Neuro: no localizing  neurological complaints, no tingling, no weakness, no double vision, no gait abnormality, no slurred speech, no confusion  As per HPI otherwise 10 point review of systems negative.   Past Medical History: Past Medical History:  Diagnosis Date  . Abnormal EKG, resolved with slower HR 06/05/2013  . Arthritis    "about my whole body"  . CAD (coronary artery disease)    CABG 9/09. Cath 2012 and 02/05/13  . Chronic back pain    "mostly between my shoulders" (01/04/2015)  . Daily headache    "mostly at night; think it's because of the shingles I had" (01/04/2015)  . Gastric tumor    EGD 06/09/16  . GERD (gastroesophageal reflux disease)   . H/O echocardiogram 07/08/2008   EF 40% by 2D '09. EF 60% cath 6/14  . HTN (hypertension)   . Hyperlipemia   . Myocardial infarction 2009  . Prostate cancer ([place])   . PVD (peripheral vascular disease) ([place])    LE dopp (10/19/11) patent R SFA stent, R ant tib occluded with reconstitution within the dorsalis pedis, L SFA 70-99%, L ant tib occluded with reconstitution of flow in the dorsalis pedis, bil ABI 1.2; carotid dopp (04-27-08)-normal  . UTI (urinary tract infection) 06/05/2013   Past Surgical History:  Procedure Laterality Date  . ABDOMINAL ANGIOGRAM Left 10/13/08   LSFA PTA  . ABDOMINAL ANGIOGRAM Right 07/28/2008   RSFA  . ANGIOPLASTY / STENTING FEMORAL Right 01/04/2015  . CARDIAC CATHETERIZATION  07/04/2011   patent LIMA to diag branch, occluded ben grafts to diag and ramus branch; EF >60%   . CARDIAC CATHETERIZATION  03/20/2008   occluded prox LAD with great collaterals from the ramus and the circ and RCA to LAD; EF 45-50%, med tx  . CARDIAC CATHETERIZATION  02/05/13   SVGs occluded, no change  . CARDIAC CATHETERIZATION N/A 01/04/2015   Procedure: Left Heart Cath and Cors/Grafts Angiography;  Surgeon: Lorretta Harp, MD;  Location: [place] [facility];  Service: Cardiovascular;  Laterality: N/A;  . CATARACT EXTRACTION W/ INTRAOCULAR LENS   IMPLANT, BILATERAL Bilateral   . CORONARY ARTERY BYPASS GRAFT  05/06/2008   x3, LIMA to LAD, SVG to diad, SVG to ramus inermediate  . ESOPHAGOGASTRODUODENOSCOPY (EGD) WITH PROPOFOL N/A 06/09/2016   Procedure: ESOPHAGOGASTRODUODENOSCOPY (EGD) WITH PROPOFOL;  Surgeon: Doran Stabler, MD;  Location: [place];  Service: Endoscopy;  Laterality: N/A;  . FRACTURE SURGERY    . KNEE DISLOCATION SURGERY Right    "they cut it"  . LEFT HEART CATHETERIZATION WITH CORONARY ANGIOGRAM N/A 02/05/2013   Procedure: LEFT HEART CATHETERIZATION WITH CORONARY ANGIOGRAM;  Surgeon: Lorretta Harp, MD;  Location: [facility] CATH LAB;  Service: Cardiovascular;  Laterality: N/A;  . LEFT HEART CATHETERIZATION WITH CORONARY/GRAFT ANGIOGRAM  07/04/2011   Procedure: LEFT HEART CATHETERIZATION WITH Beatrix Fetters;  Surgeon: Lorretta Harp, MD;  Location: [facility] CATH LAB;  Service: Cardiovascular;;  . LEFT HEART CATHETERIZATION WITH CORONARY/GRAFT ANGIOGRAM  02/05/2013   Procedure: LEFT HEART CATHETERIZATION WITH Beatrix Fetters;  Surgeon: Lorretta Harp, MD;  Location: [facility] CATH LAB;  Service: Cardiovascular;;  . PERIPHERAL VASCULAR CATHETERIZATION N/A 01/04/2015   Procedure: Lower Extremity Angiography;  Surgeon: Lorretta Harp, MD;  Location: [place] [facility];  Service: Cardiovascular;  Laterality: N/A;  .  PERIPHERAL VASCULAR CATHETERIZATION N/A 05/11/2016   Procedure: Lower Extremity Angiography;  Surgeon: Lorretta Harp, MD;  Location: [place] [facility];  Service: Cardiovascular;  Laterality: N/A;  . PERIPHERAL VASCULAR CATHETERIZATION Right 05/11/2016   Procedure: Peripheral Vascular Balloon Angioplasty;  Surgeon: Lorretta Harp, MD;  Location: [place] [facility];  Service: Cardiovascular;  Laterality: Right;  SFA  . PROSTATECTOMY    . [place]   "football injury"  . TONSILLECTOMY  1940's     Social History:     reports that he quit smoking about 37 years ago. His smoking  use included Cigarettes. He has a 2.00 pack-year smoking history. He has never used smokeless tobacco. He reports that he drinks alcohol. He reports that he does not use drugs.  Allergies:  No Known Allergies     Family History:  Family History  Problem Relation Age of Onset  . Adopted: Yes  . Coronary artery disease Sister   . Coronary artery disease Father 16  . Cancer Mother 30    Medications: Prior to Admission medications   Medication Sig Start Date End Date Taking? Authorizing Provider  acetaminophen (TYLENOL) 325 MG tablet Take 2 tablets (650 mg total) by mouth every 4 (four) hours as needed for headache or mild pain. 01/05/15  Yes Luke K Kilroy, PA-C  amoxicillin-clavulanate (AUGMENTIN) 875-125 MG tablet Take 1 tablet by mouth every 12 (twelve) hours. Patient taking differently: Take 1 tablet by mouth every 12 (twelve) hours as needed (infection).  06/14/16  Yes Patrecia Pour, MD  aspirin EC 81 MG tablet Take 81 mg by mouth daily.   Yes Historical Provider, MD  baclofen (LIORESAL) 10 MG tablet Take 0.5 tablets (5 mg total) by mouth every 4 (four) hours as needed (hiccups). 06/14/16  Yes Patrecia Pour, MD  cholecalciferol (VITAMIN D) 1000 UNITS tablet Take 1,000 Units by mouth daily.   Yes Historical Provider, MD  fentaNYL (DURAGESIC - DOSED MCG/HR) 25 MCG/HR patch Place 1 patch (25 mcg total) onto the skin every 3 (three) days. 06/16/16  Yes Patrecia Pour, MD  isosorbide mononitrate (IMDUR) 30 MG 24 hr tablet Take 1 tablet (30 mg total) by mouth daily. 04/11/16  Yes Lorretta Harp, MD  metoprolol succinate (TOPROL XL) 25 MG 24 hr tablet Take 1 tablet (25 mg total) by mouth daily. 12/11/14  Yes Lorretta Harp, MD  nitroGLYCERIN (NITROSTAT) 0.4 MG SL tablet Place 1 tablet (0.4 mg total) under the tongue every 5 (five) minutes as needed for chest pain. 12/11/14  Yes Lorretta Harp, MD  ondansetron (ZOFRAN-ODT) 4 MG disintegrating tablet TAKE 4mg  TABLET UNDER TONGUE EVERY 6 HOURS AS  NEEDED FOR NAUSEA 06/15/16  Yes Historical Provider, MD  pantoprazole (PROTONIX) 40 MG tablet Take 1 tablet (40 mg total) by mouth 2 (two) times daily. 06/14/16  Yes Patrecia Pour, MD  Polyethyl Glycol-Propyl Glycol (SYSTANE) 0.4-0.3 % SOLN Place 1 drop into both eyes 4 (four) times daily as needed (dry eyes).   Yes Historical Provider, MD  polyethylene glycol powder (GLYCOLAX/MIRALAX) powder TAKE 17 GRAMS WITH 8 OUNCES OF LIQUID EVERY 8 HOURS AS NEEDED FOR CONSTIPATION 06/15/16  Yes Historical Provider, MD  pravastatin (PRAVACHOL) 40 MG tablet Take 1 tablet (40 mg total) by mouth every evening. 01/13/15  Yes Lorretta Harp, MD  prochlorperazine (COMPAZINE) 10 MG tablet Take 10 mg by mouth every 6 (six) hours as needed for nausea or vomiting.   Yes Historical  Provider, MD  promethazine (PHENERGAN) 25 MG suppository Place 25 mg rectally every 6 (six) hours as needed for nausea or vomiting.   Yes Historical Provider, MD  sorbitol 70 % solution Take 30 mLs by mouth 2 (two) times daily as needed (constipation).   Yes Historical Provider, MD    Physical Exam: Patient Vitals for the past 24 hrs:  BP Temp Temp src Pulse Resp SpO2 Height Weight  06/26/16 2031 141/87 98.5 F (36.9 C) Oral 109 16 100 % - -  06/26/16 2015 143/86 - - 106 15 100 % - -  06/26/16 1933 148/87 - - 116 20 100 % - -  06/26/16 1852 147/87 - - 111 14 100 % - -  06/26/16 1820 140/79 - - 111 15 100 % - -  06/26/16 1809 - - - - - 97 % - -  06/26/16 1803 147/87 - - 116 19 100 % - -  06/26/16 1744 140/91 98.1 F (36.7 C) Oral 114 19 92 % - -  06/26/16 1742 - - - - - - 5\' 10"  (1.778 m) 50.8 kg (112 lb)  06/26/16 1736 - - - - - 92 % - -    1. General:  in No Acute distress  Cachectic 2. Psychological: Alert and  Oriented 3. Head/ENT:     Dry Mucous Membranes                          Head Non traumatic, neck supple                            Poor Dentition 4. SKIN:   decreased Skin turgor,  Skin clean Dry and intact no rash 5.  Heart: Regular rate and rhythm no Murmur, Rub or gallop 6. Lungs:  Clear to auscultation bilaterally, no wheezes or crackles   7. Abdomen: Soft,  non-tender, Non distended 8. Lower extremities: no clubbing, cyanosis, or edema 9. Neurologically Grossly intact, moving all 4 extremities equally  10. MSK: Normal range of motion   body mass index is 16.07 kg/m.  Labs on Admission:   Labs on Admission: I have personally reviewed following labs and imaging studies  CBC:  Recent Labs Lab 06/26/16 1803  WBC 13.6*  NEUTROABS 11.0*  HGB 6.9*  HCT 23.1*  MCV 85.6  PLT [id number]   Basic Metabolic Panel:  Recent Labs Lab 06/26/16 1803  NA 147*  K 3.3*  CL 108  CO2 27  GLUCOSE 136*  BUN 25*  CREATININE 0.69  CALCIUM 8.9   GFR: Estimated Creatinine Clearance: 54.7 mL/min (by C-G formula based on SCr of 0.69 mg/dL). Liver Function Tests: No results for input(s): AST, ALT, ALKPHOS, BILITOT, PROT, ALBUMIN in the last 168 hours. No results for input(s): LIPASE, AMYLASE in the last 168 hours. No results for input(s): AMMONIA in the last 168 hours. Coagulation Profile: No results for input(s): INR, PROTIME in the last 168 hours. Cardiac Enzymes: No results for input(s): CKTOTAL, CKMB, CKMBINDEX, TROPONINI in the last 168 hours. BNP (last 3 results) No results for input(s): PROBNP in the last 8760 hours. HbA1C: No results for input(s): HGBA1C in the last 72 hours. CBG:  Recent Labs Lab 06/26/16 1743  GLUCAP 123*   Lipid Profile: No results for input(s): CHOL, HDL, LDLCALC, TRIG, CHOLHDL, LDLDIRECT in the last 72 hours. Thyroid Function Tests: No results for input(s): TSH, T4TOTAL, FREET4, T3FREE, THYROIDAB in the  last 72 hours. Anemia Panel: No results for input(s): VITAMINB12, FOLATE, FERRITIN, TIBC, IRON, RETICCTPCT in the last 72 hours.   @LABRCNTIP (procalcitonin:4,lacticidven:4) )No results found for this or any previous visit (from the past 240 hour(s)).   UA  not  ordered  Lab Results  Component Value Date   HGBA1C 6.3 (H) 06/05/2013    Estimated Creatinine Clearance: 54.7 mL/min (by C-G formula based on SCr of 0.69 mg/dL).  BNP (last 3 results) No results for input(s): PROBNP in the last 8760 hours.   ECG REPORT Not ordered  Filed Weights   06/26/16 1742  Weight: 50.8 kg (112 lb)     Cultures:    Component Value Date/Time   SDES WRIST RIGHT 06/10/2016 1754   SDES WRIST LEFT 06/10/2016 1754   SPECREQUEST BOTTLES DRAWN AEROBIC AND ANAEROBIC 5CC 06/10/2016 1754   SPECREQUEST BOTTLES DRAWN AEROBIC ONLY 3CC 06/10/2016 1754   CULT NO GROWTH 5 DAYS 06/10/2016 1754   CULT NO GROWTH 5 DAYS 06/10/2016 1754   REPTSTATUS 06/15/2016 FINAL 06/10/2016 1754   REPTSTATUS 06/15/2016 FINAL 06/10/2016 1754     Radiological Exams on Admission: Dg Chest Port 1 View  Result Date: 06/26/2016 CLINICAL DATA:  Hemoptysis. Severe dysphasia. History of prostate cancer. EXAM: PORTABLE CHEST 1 VIEW COMPARISON:  06/07/2016 FINDINGS: Previous median sternotomy and CABG procedure. Small bilateral pleural effusions are identified. The lungs are hyperinflated and there are coarsened interstitial markings of COPD. No superimposed airspace consolidation or pulmonary edema. IMPRESSION: 1. Small bilateral pleural effusions. 2. COPD/emphysema Electronically Signed   By: Kerby Moors M.D.   On: 06/26/2016 18:23    Chart has been reviewed    Assessment/Plan  80 y.o. 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 Symptomatic anemia transfuse 2 units discussed with family that this is palliative intervention . CAD, CABG X 3 9/09. Cath 2012 and  02/05/13- 2/3 grafts occluded- medical Rx stable continue aspirin patient had recent DES  placed for PVD . Coronary artery disease involving coronary bypass graft of native heart with unstable angina pectoris ([place]) given difficulty with taking pills will hold off Lipitor . Dehydration administer IV fluids patient with inability to tolerate by mouth discussed overall goals of care. At this point patient wishes to BE full code. After extensive discussion with family agreed for palliative care consult and discussion of goals of care they would best suit patient. Discussed different options for short term and long-term nutritional support depending overall goals of care discussion. Patient overall poor prognosis.    . Gastric adenocarcinoma ([place]) Will need to notify oncology the patient has been admitted would benefit from further discussions overall with goals of care and symptom management . HTN (hypertension) given rebound tachycardia will restart metoprolol . Protein-calorie malnutrition, severe,  - worsening in the setting of diminished by mouth intake will need discussion of goals of care with palliative care after this decisions could be made regarding management Chronic blood loss anemia secondary to fungating gastric mass discussed with patient and family that this is chronic and ongoing process unfortunately. Support with transfusions for now  Other plan as per orders.  DVT prophylaxis:  SCD      Code Status:  FULL CODE   as  per patient    Family Communication:   Family   at  Bedside  plan of care was discussed with Daughter, Wife, multiple family members present in the room  Disposition Plan:    To home once workup is complete and patient is stable versus may need [facility] placement                     Palliative care   consulted                          Consults called: none   Admission status:   inpatient      Level of care     tele         I have spent a total of 56 min on  this admission    Aniella Wandrey 06/26/2016, 11:00 PM    [facility]  Pager [phone]   after 2 AM please page floor coverage PA If 7AM-7PM, please contact the day team taking care of the patient  [email or web address]  Password [email or web address]

## 2016-06-26 NOTE — Telephone Encounter (Addendum)
Tony Curtis from hospice called to ask for direction regarding pt care. Pt was having constipation for a few days now. Pt also having HR 120's, RR 24 SPO2 99%ra. Unable to keep fluids and food down. Pt has severe dysphagia and has had been coughing up blood with clots (3x last Friday, and 1x today about spoonful.) Pt wife would like to see if pt can come in for some fluids since hospice unsure if this was beneficial at this point. Discussed situation with Dr.Gudena and will call family to discuss situation. Tony Curtis, hospice RN to contact hospice director for further management of pt symptoms at this time.    1600- Dr. Lindi Adie called and spoke to pt wife to discuss pt issues and family concerns at this time. MD states that wife " does not want to see my husband die like this." MD would like to request hosp[facility] care at [facility] if bed becomes available. Left vm for Tony Curtis, hospice RN, to set up request for bed placement.

## 2016-06-26 NOTE — ED Notes (Signed)
Comfort cart with snacks/drinks at bedside for family.

## 2016-06-26 NOTE — Progress Notes (Signed)
Chaplain paged to provide comfort and spiritual care to Mr Bucko and his family. Mr Lesnick has cancer which has spread to his liver. He asked for an operation to cut the cancer out of his body and was told by his oncologist that his cancer is not operable. This has shocked Mr. Nevins and his family which has assumed his cancer therapy would be curative. At present the subjects of palliative, and comfort care are too sensitive to discuss. Likely within the next day or so, as the reality of this diagnosis settled the family will be better prepared to discuss options.   Of immediate concern is a blood clot which has formed in his abdomen and pain management. He reported to his wife that his pain is a 5 on a scale of ten, with ten being intense pain. His wife feels he is being brave and the pain is likely higher. The chaplain suggested speaking openly and honestly with medical staff, to use prayer and mediation to remain as calm as possible. When we spoke he was breathing in panic, as I guided him he relaxed his breathing and acknowledged that his physical pain had lowered a bit.  Recommend follow up spiritual care after the revelations of today have time to be considered and processed.  A spiritual leader from his faith group was with Mr Colindres when the chaplain departed.  Sallee Lange. Morgann Woodburn, [place]

## 2016-06-27 LAB — COMPREHENSIVE METABOLIC PANEL
ALT: 13 U/L — AB (ref 17–63)
ANION GAP: 10 (ref 5–15)
AST: 39 U/L (ref 15–41)
Albumin: 2.3 g/dL — ABNORMAL LOW (ref 3.5–5.0)
Alkaline Phosphatase: 269 U/L — ABNORMAL HIGH (ref 38–126)
BUN: 27 mg/dL — ABNORMAL HIGH (ref 6–20)
CHLORIDE: 113 mmol/L — AB (ref 101–111)
CO2: 28 mmol/L (ref 22–32)
Calcium: 8.7 mg/dL — ABNORMAL LOW (ref 8.9–10.3)
Creatinine, Ser: 0.71 mg/dL (ref 0.61–1.24)
Glucose, Bld: 138 mg/dL — ABNORMAL HIGH (ref 65–99)
POTASSIUM: 3.5 mmol/L (ref 3.5–5.1)
SODIUM: 151 mmol/L — AB (ref 135–145)
Total Bilirubin: 1.5 mg/dL — ABNORMAL HIGH (ref 0.3–1.2)
Total Protein: 5.8 g/dL — ABNORMAL LOW (ref 6.5–8.1)

## 2016-06-27 LAB — TYPE AND SCREEN
ABO/RH(D): AB POS
ANTIBODY SCREEN: NEGATIVE
UNIT DIVISION: 0
UNIT DIVISION: 0

## 2016-06-27 LAB — CBC
HEMATOCRIT: 28.2 % — AB (ref 39.0–52.0)
HEMOGLOBIN: 9.1 g/dL — AB (ref 13.0–17.0)
MCH: 26.7 pg (ref 26.0–34.0)
MCHC: 32.3 g/dL (ref 30.0–36.0)
MCV: 82.7 fL (ref 78.0–100.0)
Platelets: 237 10*3/uL (ref 150–400)
RBC: 3.41 MIL/uL — AB (ref 4.22–5.81)
RDW: 15.7 % — ABNORMAL HIGH (ref 11.5–15.5)
WBC: 13 10*3/uL — AB (ref 4.0–10.5)

## 2016-06-27 LAB — PHOSPHORUS: PHOSPHORUS: 2.8 mg/dL (ref 2.5–4.6)

## 2016-06-27 LAB — MAGNESIUM: MAGNESIUM: 2.2 mg/dL (ref 1.7–2.4)

## 2016-06-27 LAB — TSH: TSH: 2.957 u[IU]/mL (ref 0.350–4.500)

## 2016-06-27 LAB — GLUCOSE, CAPILLARY: Glucose-Capillary: 138 mg/dL — ABNORMAL HIGH (ref 65–99)

## 2016-06-27 MED ORDER — ONDANSETRON HCL 4 MG/2ML IJ SOLN
4.0000 mg | Freq: Four times a day (QID) | INTRAMUSCULAR | Status: AC
  Administered 2016-06-27 – 2016-06-29 (×8): 4 mg via INTRAVENOUS
  Filled 2016-06-27 (×8): qty 2

## 2016-06-27 MED ORDER — DEXTROSE-NACL 5-0.45 % IV SOLN
INTRAVENOUS | Status: DC
  Administered 2016-06-27 – 2016-06-29 (×4): via INTRAVENOUS

## 2016-06-27 MED ORDER — SODIUM CHLORIDE 0.9 % IV SOLN
12.5000 mg | Freq: Four times a day (QID) | INTRAVENOUS | Status: DC | PRN
  Administered 2016-06-27 – 2016-06-28 (×3): 12.5 mg via INTRAVENOUS
  Filled 2016-06-27 (×7): qty 0.5

## 2016-06-27 MED ORDER — HALOPERIDOL LACTATE 5 MG/ML IJ SOLN: 0.5000 mg | Freq: Three times a day (TID) | INTRAMUSCULAR | Status: DC

## 2016-06-27 MED ORDER — HYDROMORPHONE HCL 1 MG/ML IJ SOLN
1.0000 mg | Freq: Once | INTRAMUSCULAR | Status: AC
  Administered 2016-06-27: 1 mg via INTRAVENOUS
  Filled 2016-06-27: qty 1

## 2016-06-27 NOTE — Consult Note (Signed)
Consultation Note Date: 06/27/2016   Patient Name: Tony Curtis  DOB: 1936-06-06  MRN: [id number]  Age / Sex: 80 y.o., male  PCP: Pcp Not In System Referring Physician: Kerney Elbe, DO  Reason for Consultation: Establishing goals of care   HPI/Patient Profile: 80 y.o. 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 Due to his poor performance status, he was deemed not a candidate for   chemotherapy or surgical intervention. Patient presented with an inability to keep fluids or food down because of significant dysphagia and had been spiting up blood with clots for 3 days.   Patient was seen by palliative RN in recent hospitalization. He went home with hospice support at that time, but elected to continue with full code. Patient has been admitted to hospitalist service since 06-26-16 for symptomatic anemia, chronic blood loss anemia. He has dehydration, poor PO intake, extensive symptom burden from nausea and hiccups stand point.   He has been followed by hospice and palliative care of [place], [place] in this hospitalization throughout. There have been discussions regarding transfer to [facility] for aggressive symptom management. Hence, a palliative consult has been placed for additional discussions.   The patient is a weak appearing gentleman resting in bed, his wife and daughter are present at the bedside. I introduced myself and palliative care as follows: Palliative medicine is specialized medical care for people living with serious illness. It focuses on providing relief from  the symptoms and stress of a serious illness. The goal is to improve quality of life for both the patient and the family.  Goals and wishes discussed in detail. Disease trajectory from rapidly progressive cancer discussed extensively, symptom burden and some strategies to help with that also discussed, see recommendations below, all questions answered, thank you for the consult.    HCPOA  wife, daughter. Patient has 5 children who live locally.   SUMMARY OF RECOMMENDATIONS    1.Add thorazine IV 2. Schedule Zofran IV 3. Code status now revised as DNR DNI after extensive discussions with patient and his family.  4. Patient wishes to go home with hospice [facility] towards the end of his hospitalization, he does not want to go to [facility] as he wishes to die at home.   Code Status/Advance Care Planning:  DNR    Symptom Management:     See above  Palliative Prophylaxis:   Bowel Regimen  Additional Recommendations (Limitations, Scope, Preferences):  Full Comfort Care  Psycho-social/Spiritual:   Desire for further Chaplaincy support:yes  Additional Recommendations: Education on Hospice  Prognosis:   < 2 weeks  Discharge Planning: Home with Hospice      Primary Diagnoses: Present on Admission: .  Anemia . CAD, CABG X 3 9/09. Cath 2012 and 02/05/13- 2/3 grafts occluded- medical Rx . Coronary artery disease involving coronary bypass graft of native heart with unstable angina pectoris ([place]) . Dehydration . Gastric adenocarcinoma ([place]) . HTN (hypertension) . Protein-calorie malnutrition, severe, he is on chronic steroids . Symptomatic anemia   I have reviewed the medical record, interviewed the patient and family, and examined the patient. The following aspects are pertinent.  Past Medical History:  Diagnosis Date  . Abnormal EKG, resolved with slower HR 06/05/2013  . Arthritis    "about my whole body"  . CAD (coronary artery disease)    CABG 9/09. Cath 2012 and  02/05/13  . Chronic back pain    "mostly between my shoulders" (01/04/2015)  . Daily headache    "mostly at night; think it's because of the shingles I had" (01/04/2015)  . Gastric tumor    EGD 06/09/16  . GERD (gastroesophageal reflux disease)   . H/O echocardiogram 07/08/2008   EF 40% by 2D '09. EF 60% cath 6/14  . HTN (hypertension)   . Hyperlipemia   . Myocardial infarction 2009  . Prostate cancer ([place])   . PVD (peripheral vascular disease) ([place])    LE dopp (10/19/11) patent R SFA stent, R ant tib occluded with reconstitution within the dorsalis pedis, L SFA 70-99%, L ant tib occluded with reconstitution of flow in the dorsalis pedis, bil ABI 1.2; carotid dopp (04-27-08)-normal  . UTI (urinary tract infection) 06/05/2013   Social History   Social History  . Marital status: Married    Spouse name: N/A  . Number of children: N/A  . Years of education: N/A   Social History Main Topics  . Smoking status: Former Smoker    Packs/day: 0.10    Years: 20.00    Types: Cigarettes    Quit date: 06/14/1979  . Smokeless tobacco: Never Used  . Alcohol use Yes     Comment: 01/04/2015 "might have a glass of wine q couple years"  . Drug use: No  . Sexual activity: Not Asked   Other Topics Concern  . None   Social History Narrative  . None   Family History  Problem Relation Age of Onset  . Adopted: Yes  . Coronary artery disease Sister   . Coronary artery disease Father 71  . Cancer Mother 32   Scheduled Meds: . acetaminophen  650 mg Oral Once  . aspirin EC  81 mg Oral Daily  . feeding supplement (ENSURE ENLIVE)  237 mL Oral BID BM  . fentaNYL  25 mcg Transdermal Q72H  . metoprolol succinate  25 mg Oral Daily  . ondansetron (ZOFRAN) IV  4 mg Intravenous Q6H  . sodium chloride flush  3 mL Intravenous Q12H   Continuous Infusions: . dextrose 5 % and 0.45% NaCl 75 mL/hr at 06/27/16 1002   PRN Meds:.acetaminophen **OR** acetaminophen, chlorproMAZINE (THORAZINE) IV, ondansetron  **OR** ondansetron (ZOFRAN) IV Medications Prior to Admission:  Prior to Admission medications   Medication Sig Start Date End Date Taking? Authorizing Provider  acetaminophen (TYLENOL) 325 MG tablet Take 2 tablets (650 mg total) by mouth every 4 (four) hours as needed for headache or mild pain. 01/05/15  Yes Luke K Kilroy, PA-C  amoxicillin-clavulanate (AUGMENTIN) 875-125 MG tablet Take 1 tablet by mouth every 12 (twelve) hours. Patient taking differently: Take 1 tablet by mouth every 12 (twelve) hours as needed (infection).  06/14/16  Yes Patrecia Pour, MD  aspirin EC 81  MG tablet Take 81 mg by mouth daily.   Yes Historical Provider, MD  baclofen (LIORESAL) 10 MG tablet Take 0.5 tablets (5 mg total) by mouth every 4 (four) hours as needed (hiccups). 06/14/16  Yes Patrecia Pour, MD  cholecalciferol (VITAMIN D) 1000 UNITS tablet Take 1,000 Units by mouth daily.   Yes Historical Provider, MD  fentaNYL (DURAGESIC - DOSED MCG/HR) 25 MCG/HR patch Place 1 patch (25 mcg total) onto the skin every 3 (three) days. 06/16/16  Yes Patrecia Pour, MD  isosorbide mononitrate (IMDUR) 30 MG 24 hr tablet Take 1 tablet (30 mg total) by mouth daily. 04/11/16  Yes Lorretta Harp, MD  metoprolol succinate (TOPROL XL) 25 MG 24 hr tablet Take 1 tablet (25 mg total) by mouth daily. 12/11/14  Yes Lorretta Harp, MD  nitroGLYCERIN (NITROSTAT) 0.4 MG SL tablet Place 1 tablet (0.4 mg total) under the tongue every 5 (five) minutes as needed for chest pain. 12/11/14  Yes Lorretta Harp, MD  ondansetron (ZOFRAN-ODT) 4 MG disintegrating tablet TAKE 4mg  TABLET UNDER TONGUE EVERY 6 HOURS AS NEEDED FOR NAUSEA 06/15/16  Yes Historical Provider, MD  pantoprazole (PROTONIX) 40 MG tablet Take 1 tablet (40 mg total) by mouth 2 (two) times daily. 06/14/16  Yes Patrecia Pour, MD  Polyethyl Glycol-Propyl Glycol (SYSTANE) 0.4-0.3 % SOLN Place 1 drop into both eyes 4 (four) times daily as needed (dry eyes).   Yes Historical Provider, MD  polyethylene  glycol powder (GLYCOLAX/MIRALAX) powder TAKE 17 GRAMS WITH 8 OUNCES OF LIQUID EVERY 8 HOURS AS NEEDED FOR CONSTIPATION 06/15/16  Yes Historical Provider, MD  pravastatin (PRAVACHOL) 40 MG tablet Take 1 tablet (40 mg total) by mouth every evening. 01/13/15  Yes Lorretta Harp, MD  prochlorperazine (COMPAZINE) 10 MG tablet Take 10 mg by mouth every 6 (six) hours as needed for nausea or vomiting.   Yes Historical Provider, MD  promethazine (PHENERGAN) 25 MG suppository Place 25 mg rectally every 6 (six) hours as needed for nausea or vomiting.   Yes Historical Provider, MD  sorbitol 70 % solution Take 30 mLs by mouth 2 (two) times daily as needed (constipation).   Yes Historical Provider, MD   No Known Allergies Review of Systems + for nausea, + for hiccups  Physical Exam Cachectic Weak in distress Thin extremities some clubbing of digits noted Shallow breathing S1 S2 Abdomen wasting evident Awake alert, is able to participate in conversations.   Vital Signs: BP (!) 153/86 (BP Location: Right Arm)   Pulse (!) 103   Temp 98.3 F (36.8 C) (Oral)   Resp 17   Ht 5\' 10"  (1.778 m)   Wt 46.6 kg (102 lb 11.8 oz)   SpO2 100%   BMI 14.74 kg/m  Pain Assessment: 0-10   Pain Score: 6    SpO2: SpO2: 100 % O2 Device:SpO2: 100 % O2 Flow Rate: .O2 Flow Rate (L/min): 2 L/min  IO: Intake/output summary:  Intake/Output Summary (Last 24 hours) at 06/27/16 1507 Last data filed at 06/27/16 1500  Gross per 24 hour  Intake          1900.83 ml  Output              200 ml  Net          1700.83 ml    LBM: Last BM Date: 06/23/16 Baseline Weight: Weight: 50.8 kg (112 lb) Most recent weight: Weight: 46.6 kg (102 lb 11.8 oz)     Palliative  Assessment/Data:   Flowsheet Rows   Flowsheet Row Most Recent Value  Intake Tab  Referral Department  Hospitalist  Unit at Time of Referral  Oncology Unit  Palliative Care Primary Diagnosis  Cancer  Date Notified  06/26/16  Palliative Care Type  Return  patient Palliative Care  Reason for referral  Non-pain Symptom, Clarify Goals of Care, Counsel Regarding Hospice, End of Life Care Assistance  Date of Admission  06/26/16  # of days IP prior to Palliative referral  0  Clinical Assessment  Palliative Performance Scale Score  20%  Pain Max last 24 hours  5  Pain Min Last 24 hours  4  Dyspnea Max Last 24 Hours  5  Dyspnea Min Last 24 hours  4  Nausea Max Last 24 Hours  9  Nausea Min Last 24 Hours  7  Anxiety Max Last 24 Hours  6  Anxiety Min Last 24 Hours  5  Psychosocial & Spiritual Assessment  Palliative Care Outcomes  Patient/Family meeting held?  Yes  Who was at the meeting?  patient wife daughter   Palliative Care Outcomes  Clarified goals of care, Improved non-pain symptom therapy  Palliative Care follow-up planned  Yes, Home      Time In:  1345 Time Out:  1500 Time Total:  75 min  Greater than 50%  of this time was spent counseling and coordinating care related to the above assessment and plan.  Signed by: Loistine Chance, MD  [phone]  Please contact Palliative Medicine Team phone at [phone] for questions and concerns.  For individual provider: See Shea Evans

## 2016-06-27 NOTE — Progress Notes (Signed)
PROGRESS NOTE    Tony THERRELL  [id number] DOB: 02/02/1936 DOA: 06/26/2016 PCP: Pcp Not In System   Brief Narrative:  Tony Curtis is a 80 y.o. 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 Patient presented with an inability to keep fluids or food down because of significant dysphagia and has been spiting up blood with clots for 3 days. The plan was to be transferred to [facility] when bed was available but patient remains FULL Code at this point and family was not receptive to [place] given new diagnosis. Recently here with Sepsis due to Aspiration PNA and patient was discharged to Hospice. Patient admitted for Symptomatic Anemia and Dehydration from Intractable N/V. Palliative Care Medicine to Evaluate.   Assessment & Plan:   Active Problems:   HTN (hypertension)   CAD, CABG X 3 9/09. Cath 2012 and 02/05/13- 2/3 grafts occluded- medical Rx   Protein-calorie malnutrition, severe, he is on chronic steroids   Coronary artery disease involving coronary bypass graft of native heart with unstable angina pectoris (HCC)   Symptomatic anemia   Dehydration   Anemia   Gastric adenocarcinoma (H[facility])  Symptomatic Anemia and Chronic Blood Loss Anemia from Large Fungating Invasive Gastric Adenocarcinoma s/p Transfusion of 2 units of pRBC's -Patient's Hb/Hct went from 6.9/23.1 -> 9.1/28.2 -Repeat CBC in AM  Dehydration from poor po Intake and inablity tolerate from Fungating Invasive Gastric Adenocarcinoma -Patient has intractable N/V -C/w Zofran 4 mg po and IV q6hprn -Patient's Sodium increased from 147 -> 151 -Metoprolol 25 mg po Daily started for Sinus Tachycardia -Changed Fluids to D5 1/2 NS at a rate of  75 mL  Fungating Invasive Gastric Adenocarinoma -Hematology/Oncology Notified- Not a Candidate for Chemotherapy or Surgical Intervention -Palliative Care Medicine Consulted for Goals of Care and Symptom Management and appreciate Recc's and Management -Pain Control with Acetaminophen 650 mg po q6hprn, Hydrocodone-Acetaminophen 1-2 tablets po q4hprn.  -Hospice Nurse Evaluated and recommends against gastric Feeding tube and recommended that the focus be on management of pain -Possible [facility] for Hospice or Home with Hospice at D/C  Severe Protein-Calorie Malunutriton -C/w Ensure Enlive 237 mL  CAD with CABG x 3  -Cw ASA as patient had DES for PVD -Lipitor has been Held.   Hypertension -C/w Metoprolol Succinate 25 mg po Daily  DVT prophylaxis: SCDs Code Status: FULL Family Communication: Discussed with Wife and Family present at Bedside Disposition Plan: Pending Palliative Care Evaluation and resumption of Hospice  Consultants:   Palliative Care Medicine  Procedures: None  Antimicrobials: None  Subjective: Seen and examined at bedside and stated he was very weak and lethargic and appeared uncomfortable. Unable to keep po intake down. Denied CP/SOB/N/V and states he feels a little better after the blood.   Objective: Vitals:   06/27/16 0304 06/27/16 0556 06/27/16 0826 06/27/16 1002  BP: (!) 147/81 140/76 140/80 (!) 153/86  Pulse: (!) 102 98 (!) 102 (!) 103  Resp: 18 18  17   Temp: 98.4 F (36.9 C) 99.1 F (37.3 C)  98.3 F (36.8 C)  TempSrc: Oral Oral  Oral  SpO2: 100% 100%  100%  Weight:      Height:        Intake/Output Summary (Last 24 hours) at 06/27/16 1400 Last  data filed at 06/27/16 0331  Gross per 24 hour  Intake          1528.33 ml  Output              200 ml  Net          1328.33 ml   Filed Weights   06/26/16 1742 06/26/16 2300  Weight: 50.8 kg (112 lb) 46.6 kg (102 lb 11.8 oz)    Examination: Physical Exam:  Constitutional: Thin frail AA  who appears severely malnuorished Eyes: Lids and conjunctivae pale, sclerae anicteric  ENMT: External Ears, Nose appear normal. Grossly normal hearing. Mucous membranes are extremely dry.  Neck: Appears normal, supple, no cervical masses, normal ROM, no appreciable thyromegaly Respiratory: Diminished bilaterally, no wheezing, rales, rhonchi or crackles. Normal respiratory effort and patient is not tachypenic. No accessory muscle use.  Cardiovascular: Tachycardic Rate. No murmurs / rubs / gallops. S1 and S2 auscultated. No extremity edema.  Abdomen: Soft, non-tender, non-distended. No masses palpated. No appreciable hepatosplenomegaly. Bowel sounds positive.  GU: Deferred. Musculoskeletal: No clubbing / cyanosis of digits/nails. No joint deformity upper and lower extremities.  Skin: No rashes, lesions, ulcers. No induration; Warm and dry.  Neurologic: CN 2-12 grossly intact with no focal deficits. Sensation intact in all 4 Extremities, DTR normal. Romberg sign cerebellar reflexes not assessed.  Psychiatric: Normal judgment and insight. Alert and oriented x 3. Depressed mood and flat affect.   Data Reviewed: I have personally reviewed following labs and imaging studies  CBC:  Recent Labs Lab 06/26/16 1803 06/27/16 0427  WBC 13.6* 13.0*  NEUTROABS 11.0*  --   HGB 6.9* 9.1*  HCT 23.1* 28.2*  MCV 85.6 82.7  PLT 294 [id number]   Basic Metabolic Panel:  Recent Labs Lab 06/26/16 1803 06/27/16 0427  NA 147* 151*  K 3.3* 3.5  CL 108 113*  CO2 27 28  GLUCOSE 136* 138*  BUN 25* 27*  CREATININE 0.69 0.71  CALCIUM 8.9 8.7*  MG  --  2.2  PHOS  --  2.8   GFR: Estimated Creatinine Clearance: 50.2 mL/min (by C-G formula based on SCr of 0.71 mg/dL). Liver Function Tests:  Recent Labs Lab 06/27/16 0427  AST 39  ALT 13*  ALKPHOS 269*  BILITOT 1.5*  PROT 5.8*  ALBUMIN 2.3*   No results for input(s): LIPASE, AMYLASE in the last 168 hours. No results for input(s): AMMONIA in the last 168  hours. Coagulation Profile: No results for input(s): INR, PROTIME in the last 168 hours. Cardiac Enzymes: No results for input(s): CKTOTAL, CKMB, CKMBINDEX, TROPONINI in the last 168 hours. BNP (last 3 results) No results for input(s): PROBNP in the last 8760 hours. HbA1C: No results for input(s): HGBA1C in the last 72 hours. CBG:  Recent Labs Lab 06/26/16 1743  GLUCAP 123*   Lipid Profile: No results for input(s): CHOL, HDL, LDLCALC, TRIG, CHOLHDL, LDLDIRECT in the last 72 hours. Thyroid Function Tests:  Recent Labs  06/27/16 0427  TSH 2.957   Anemia Panel: No results for input(s): VITAMINB12, FOLATE, FERRITIN, TIBC, IRON, RETICCTPCT in the last 72 hours. Sepsis Labs: No results for input(s): PROCALCITON, LATICACIDVEN in the last 168 hours.  No results found for this or any previous visit (from the past 240 hour(s)).   Radiology Studies: Dg Chest Port 1 View  Result Date: 06/26/2016 CLINICAL DATA:  Hemoptysis. Severe dysphasia. History of prostate cancer. EXAM: PORTABLE CHEST 1 VIEW COMPARISON:  06/07/2016 FINDINGS: Previous median sternotomy and CABG procedure. Small  bilateral pleural effusions are identified. The lungs are hyperinflated and there are coarsened interstitial markings of COPD. No superimposed airspace consolidation or pulmonary edema. IMPRESSION: 1. Small bilateral pleural effusions. 2. COPD/emphysema Electronically Signed   By: Kerby Moors M.D.   On: 06/26/2016 18:23   Scheduled Meds: . acetaminophen  650 mg Oral Once  . aspirin EC  81 mg Oral Daily  . diphenhydrAMINE  25 mg Oral Once  . feeding supplement (ENSURE ENLIVE)  237 mL Oral BID BM  . fentaNYL  25 mcg Transdermal Q72H  . metoprolol succinate  25 mg Oral Daily  . sodium chloride flush  3 mL Intravenous Q12H   Continuous Infusions: . dextrose 5 % and 0.45% NaCl 75 mL/hr at 06/27/16 1002    LOS: 1 day   Kerney Elbe, DO [facility] Pager [phone]  If 7PM-7AM, please  contact night-coverage [email or web address] Password [email or web address] 06/27/2016, 2:00 PM

## 2016-06-27 NOTE — Progress Notes (Signed)
Nutrition Brief Note  Patient identified on the Malnutrition Screening Tool (MST) Report. All information obtained from the chart at this time. Pt meets criteria for SEVERE MALNUTRITION IN THE SETTING OF ACUTE ILLNESS based on weight loss of 24 lbs 29 lbs (22% body weight) in the past 3 months and <50% needed intakes for >/=5 days.   Wt Readings from Last 15 Encounters:  06/26/16 102 lb 11.8 oz (46.6 kg)  06/14/16 122 lb 8 oz (55.6 kg)  05/31/16 115 lb 12.8 oz (52.5 kg)  05/12/16 126 lb 1.7 oz (57.2 kg)  04/04/16 131 lb (59.4 kg)  10/01/15 136 lb (61.7 kg)  01/13/15 124 lb (56.2 kg)  01/05/15 122 lb 2.2 oz (55.4 kg)  12/11/14 126 lb (57.2 kg)  12/03/14 129 lb (58.5 kg)  11/19/14 129 lb 12.8 oz (58.9 kg)  09/11/13 122 lb (55.3 kg)  06/11/13 118 lb (53.5 kg)  06/05/13 113 lb 12.8 oz (51.6 kg)  02/21/13 127 lb 1.6 oz (57.7 kg)    Body mass index is 14.74 kg/m. Patient meets criteria for underweight based on current BMI. Stage 2 sacral pressure ulcer.  Current diet order is Dysphagia 3 with thin liquids; no intakes documented since admission.   Per H&P: Presented with inability to keep fluids down or food he has significant dysphagia and had been spitting up blood with clots for past 3 days. The plan was for patient to be transferred to [facility] place  when bed was available. On October 27 he underwent upper endoscopy by Dr. Loletha Carrow showing gastric fungating bleeding mass. CT scan of abdomen showed liver metastases and pathology was positive for invasive adenocarcinoma. Oncology had been consulted during his prior admission in October and recommended comfort care given medical comorbidities and risk of systemic chemotherapy.    RN note from this AM at [phone] indicated that pt's family was considering a feeding tube. RN from [facility] met with family shortly after that time and informed family that feeding tube would not increase quality or quantity of life and that focus should be on symptom  management. Social worker from [employer] note this afternoon indicates family desires comfort care measures but still waiting to meet with Palliative Care team; no desire for [facility] and hopeful to return home.   Medications reviewed; PRN IV Zofran.  Labs reviewed; Na: 151 mmol/L, Cl: 113 mmol/L, BUN: 27 mg/dL, Ca: 8.7 mg/dL, Alk Phos elevated.  IVF: D5-1/2 NS @ 75 mL/hr (306 kcal).   RD will follow-up tomorrow to review chart for POC/GOC and intervene if medically appropriate at that time.     Jarome Matin, MS, RD, LDN Inpatient Clinical Dietitian Pager # [phone] After hours/weekend pager # [phone]

## 2016-06-27 NOTE — Progress Notes (Signed)
CSW received consult for residential hospice placement. CSW confirmed with [facility] RN, Abigail Butts that patient is already a hospice patient & noted that palliative consult is pending. CSW will await outcome of palliative meeting.    Raynaldo Opitz, [facility] Clinical Social Worker cell #: [phone]

## 2016-06-27 NOTE — Progress Notes (Signed)
[facility] [place] Work note LCSW met with family at the bedside as patient slept. He appeared to be in no distress. Family report desire for comfort measures, yet wanting to still speak with Palliative Medicine. Family do report no desire for [facility] and that if discharge is planned, then they may consider returning home. Family asked about legal issues related to will, POA. LCSW addressed role of pro bono attorney through hospice and family request consult to discuss. LCSW offered support, encouragement.  Katherina Right, [place]

## 2016-06-27 NOTE — Progress Notes (Signed)
Family and patient are considering feeding tube but hospice nurse is encouraging against it. Pt unable to hold down anything PO. MD paged for IV pain and nausea meds.

## 2016-06-27 NOTE — Progress Notes (Signed)
[facility] RN note.   This is a GIP related and covered admission of 06/26/16, per Dr. Orpah Melter.  Patient has an [facility] diagnosis of gastric cancer.  Code status at this time is FULL CODE.  Visited patient at the bedside.  He is vomiting.  He states he was given something po for nausea, but can't keep it down.  Spoke with Staff RN, Tony Curtis, to request IV meds for nausea.  Patient's daughter, Tony Curtis, and a grand-daughter are present.  Tony Curtis states more family members will visit shortly.  Noted, Palliative Medicine will meet to discuss goals of care.   Tony Curtis states patient wants a feeding tube.  This Probation officer explained I don't believe this will offer him quality of life or prolong his life.  I explained the focus should be on managing his uncomfortable symptoms.  Tony Curtis asks questions about [employer], which this Probation officer answered.    Patient has received Dilaudid 1 mg IV x 2 since admission for pain.   Noted, as of [phone], there is a new order in EPIC for Zofran IV prn.    0.9% NACL infusing at 100 ml/hour.    Spoke with Tony Curtis, SW, regarding possible [employer] placement.  Discussed it is important that Palliative Medicine team meet with patient, before determining patient's [facility] eligibility.  [facility] will continue to follow daily and assist as needed.    [name], RN, BSN [phone]

## 2016-06-27 NOTE — Care Management Note (Signed)
Case Management Note  Patient Details  Name: HASAN CUSTARD MRN: [id number] Date of Birth: 1936/01/05  Subjective/Objective:  80 y/o m admitted w/n/v,anemia. [id number] Ca, liver mets,invasive Adenocarcinoma. Full code.Oncology-not a candidate for chemo/surgery.Active w/[facility]-rep Abigail Butts has documented(see mote)-GIP related admission. Palliative cons-await recc.                   Action/Plan:current d/c plan [facility].   Expected Discharge Date:   (unknown)               Expected Discharge Plan:  Home w Hospice Care  In-House Referral:  Clinical Social Work  Discharge planning Services  CM Consult  Post Acute Care Choice:  Hospice (Active w/[facility]) Choice offered to:     DME Arranged:    DME Agency:     HH Arranged:    HH Agency:     Status of Service:  In process, will continue to follow  If discussed at Long Length of Stay Meetings, dates discussed:    Additional Comments:  Dessa Phi, RN 06/27/2016, 11:27 AM

## 2016-06-28 DIAGNOSIS — D649 Anemia, unspecified: Secondary | ICD-10-CM

## 2016-06-28 DIAGNOSIS — E860 Dehydration: Secondary | ICD-10-CM

## 2016-06-28 DIAGNOSIS — I10 Essential (primary) hypertension: Secondary | ICD-10-CM

## 2016-06-28 DIAGNOSIS — D500 Iron deficiency anemia secondary to blood loss (chronic): Secondary | ICD-10-CM

## 2016-06-28 DIAGNOSIS — C169 Malignant neoplasm of stomach, unspecified: Secondary | ICD-10-CM

## 2016-06-28 DIAGNOSIS — I25700 Atherosclerosis of coronary artery bypass graft(s), unspecified, with unstable angina pectoris: Secondary | ICD-10-CM

## 2016-06-28 DIAGNOSIS — E43 Unspecified severe protein-calorie malnutrition: Secondary | ICD-10-CM

## 2016-06-28 DIAGNOSIS — C799 Secondary malignant neoplasm of unspecified site: Secondary | ICD-10-CM

## 2016-06-28 LAB — COMPREHENSIVE METABOLIC PANEL
ALBUMIN: 2.1 g/dL — AB (ref 3.5–5.0)
ALK PHOS: 226 U/L — AB (ref 38–126)
ALT: 13 U/L — ABNORMAL LOW (ref 17–63)
ANION GAP: 8 (ref 5–15)
AST: 38 U/L (ref 15–41)
BILIRUBIN TOTAL: 0.5 mg/dL (ref 0.3–1.2)
BUN: 21 mg/dL — AB (ref 6–20)
CALCIUM: 8.6 mg/dL — AB (ref 8.9–10.3)
CO2: 29 mmol/L (ref 22–32)
Chloride: 115 mmol/L — ABNORMAL HIGH (ref 101–111)
Creatinine, Ser: 0.76 mg/dL (ref 0.61–1.24)
GFR calc Af Amer: >60 mL/min (ref >60)
GLUCOSE: 163 mg/dL — AB (ref 65–99)
Potassium: 3.1 mmol/L — ABNORMAL LOW (ref 3.5–5.1)
Sodium: 152 mmol/L — ABNORMAL HIGH (ref 135–145)
TOTAL PROTEIN: 5.3 g/dL — AB (ref 6.5–8.1)

## 2016-06-28 LAB — CBC WITH DIFFERENTIAL/PLATELET
BASOS PCT: 1 %
Basophils Absolute: 0.1 10*3/uL (ref 0.0–0.1)
EOS PCT: 1 %
Eosinophils Absolute: 0.1 10*3/uL (ref 0.0–0.7)
HEMATOCRIT: 24.4 % — AB (ref 39.0–52.0)
HEMOGLOBIN: 7.8 g/dL — AB (ref 13.0–17.0)
LYMPHS ABS: 1.1 10*3/uL (ref 0.7–4.0)
Lymphocytes Relative: 10 %
MCH: 26.7 pg (ref 26.0–34.0)
MCHC: 32 g/dL (ref 30.0–36.0)
MCV: 83.6 fL (ref 78.0–100.0)
MONOS PCT: 11 %
Monocytes Absolute: 1.2 10*3/uL — ABNORMAL HIGH (ref 0.1–1.0)
NEUTROS ABS: 8.6 10*3/uL — AB (ref 1.7–7.7)
Neutrophils Relative %: 77 %
Platelets: 175 10*3/uL (ref 150–400)
RBC: 2.92 MIL/uL — AB (ref 4.22–5.81)
RDW: 16.3 % — ABNORMAL HIGH (ref 11.5–15.5)
WBC: 11.1 10*3/uL — AB (ref 4.0–10.5)

## 2016-06-28 LAB — PHOSPHORUS: Phosphorus: 2.2 mg/dL — ABNORMAL LOW (ref 2.5–4.6)

## 2016-06-28 LAB — MAGNESIUM: Magnesium: 2.1 mg/dL (ref 1.7–2.4)

## 2016-06-28 MED ORDER — MENTHOL 3 MG MT LOZG
1.0000 | LOZENGE | OROMUCOSAL | Status: DC | PRN
  Filled 2016-06-28: qty 9

## 2016-06-28 MED ORDER — SODIUM CHLORIDE 0.9 % IV SOLN
12.5000 mg | Freq: Four times a day (QID) | INTRAVENOUS | Status: DC | PRN
  Administered 2016-06-28: 12.5 mg via INTRAVENOUS
  Filled 2016-06-28 (×2): qty 0.5

## 2016-06-28 MED ORDER — CHLORPROMAZINE HCL 50 MG PO TABS
50.0000 mg | ORAL_TABLET | Freq: Three times a day (TID) | ORAL | Status: DC | PRN
  Administered 2016-06-28: 50 mg via ORAL
  Filled 2016-06-28 (×2): qty 1

## 2016-06-28 MED ORDER — PHENOL 1.4 % MT LIQD
1.0000 | OROMUCOSAL | Status: DC | PRN
  Filled 2016-06-28: qty 177

## 2016-06-28 NOTE — Care Management Note (Signed)
Case Management Note  Patient Details  Name: Tony Curtis MRN: [id number] Date of Birth: 1936-01-21  Subjective/Objective:  Patient wants home w/[facility] services-rep already following. D/c plan in am.Patient already has home 02,3n1,overbed table,hospital bed,& tub bench.Will need ambulance transp([facility]) @ d/c-confirmed address on face sheet.                 Action/Plan:d/c home w/[facility].   Expected Discharge Date:   (unknown)               Expected Discharge Plan:  Home w Hospice Care  In-House Referral:  Clinical Social Work  Discharge planning Services  CM Consult  Post Acute Care Choice:  Hospice (Active w/[facility]) Choice offered to:     DME Arranged:    DME Agency:     HH Arranged:    HH Agency:     Status of Service:  In process, will continue to follow  If discussed at Long Length of Stay Meetings, dates discussed:    Additional Comments:  Dessa Phi, RN 06/28/2016, 10:49 AM

## 2016-06-28 NOTE — Progress Notes (Signed)
Daily Progress Note   Patient Name: Tony Curtis       Date: 06/28/2016 DOB: 1936-04-11  Age: 80 y.o. MRN#: [id number] Attending Physician: Debbe Odea, MD Primary Care Physician: Pcp Not In System Admit Date: 06/26/2016  Reason for Consultation/Follow-up: Establishing goals of care  Subjective:  hiccups and nausea under much better control from thorazine use.  Recommend thorazine PO be considered by hospice after discharge home.   Length of Stay: 2  Current Medications: Scheduled Meds:  . acetaminophen  650 mg Oral Once  . aspirin EC  81 mg Oral Daily  . feeding supplement (ENSURE ENLIVE)  237 mL Oral BID BM  . fentaNYL  25 mcg Transdermal Q72H  . metoprolol succinate  25 mg Oral Daily  . ondansetron (ZOFRAN) IV  4 mg Intravenous Q6H  . sodium chloride flush  3 mL Intravenous Q12H    Continuous Infusions: . dextrose 5 % and 0.45% NaCl Stopped (06/28/16 1031)    PRN Meds: acetaminophen **OR** acetaminophen, chlorproMAZINE (THORAZINE) IV, menthol-cetylpyridinium, ondansetron **OR** ondansetron (ZOFRAN) IV, phenol  Physical Exam         Weak frail S1 S2 Clear diminished Abdomen soft No edema Awakens easily   Vital Signs: BP 124/70 (BP Location: Right Arm)   Pulse (!) 111   Temp 99.3 F (37.4 C) (Oral)   Resp 18   Ht 5\' 10"  (1.778 m)   Wt 46.6 kg (102 lb 11.8 oz)   SpO2 100%   BMI 14.74 kg/m  SpO2: SpO2: 100 % O2 Device: O2 Device: Nasal Cannula O2 Flow Rate: O2 Flow Rate (L/min): 2 L/min  Intake/output summary:  Intake/Output Summary (Last 24 hours) at 06/28/16 1115 Last data filed at 06/28/16 1031  Gross per 24 hour  Intake            447.5 ml  Output              300 ml  Net            147.5 ml   LBM: Last BM Date: 06/27/16 Baseline Weight: Weight:  50.8 kg (112 lb) Most recent weight: Weight: 46.6 kg (102 lb 11.8 oz)       Palliative Assessment/Data:    Flowsheet Rows   Flowsheet Row Most Recent Value  Intake Tab  Referral Department  Hospitalist  Unit at Time of Referral  Oncology Unit  Palliative Care Primary Diagnosis  Cancer  Date Notified  06/26/16  Palliative Care Type  Return patient Palliative Care  Reason for referral  Clarify Goals of Care, Non-pain Symptom  Date of Admission  06/26/16  # of days IP prior to Palliative referral  0  Clinical Assessment  Palliative Performance Scale Score  30%  Pain Max last 24 hours  5  Pain Min Last 24 hours  4  Dyspnea Max Last 24 Hours  5  Dyspnea Min Last 24 hours  4  Nausea Max Last 24 Hours  7  Nausea Min Last 24 Hours  4  Anxiety Max Last 24 Hours  6  Anxiety Min Last 24 Hours  5  Psychosocial & Spiritual Assessment  Palliative Care Outcomes  Patient/Family meeting held?  Yes  Who was at the meeting?  patient wife daughters   Palliative Care Outcomes  Clarified goals of care  Palliative Care follow-up planned  Yes, Home  Palliative Care Follow-up Reason  Non-pain symptom      Patient Active Problem List   Diagnosis Date Noted  . Failure to thrive in adult   . Gastric adenocarcinoma ([place])   . Anemia 06/10/2016  . Malignancy ([place])   . Symptomatic anemia 06/07/2016  . Dehydration 06/07/2016  . Esophageal dysphagia 06/07/2016  . Odynophagia 06/07/2016  . Claudication ([place]) 01/04/2015  . Coronary artery disease involving coronary bypass graft of native heart with unstable angina pectoris ([place])   . Protein-calorie malnutrition, severe, he is on chronic steroids 02/05/2013  . HTN (hypertension)   . Hyperlipemia   . CAD, CABG X 3 9/09. Cath 2012 and 02/05/13- 2/3 grafts occluded- medical Rx   . Prostate cancer ([place])   . PVD - Rt SFA PTA 8/112     Palliative Care Assessment & Plan   Patient Profile:    Assessment:  gastric  cancer Nausea Vomiting Abdominal pain Hiccups   Recommendations/Plan:  Home with hospice care on discharge  Thorazine PO or solution for nausea and hiccups after discharge.   Continue current mode of treatment.    Code Status:    Code Status Orders        Start     Ordered   06/27/16 1459  Do not attempt resuscitation (DNR)  Continuous    Question Answer Comment  In the event of cardiac or respiratory ARREST Do not call a "code blue"   In the event of cardiac or respiratory ARREST Do not perform Intubation, CPR, defibrillation or ACLS   In the event of cardiac or respiratory ARREST Use medication by any route, position, wound care, and other measures to relive pain and suffering. May use oxygen, suction and manual treatment of airway obstruction as needed for comfort.      06/27/16 1459    Code Status History    Date Active Date Inactive Code Status Order ID Comments User Context   06/26/2016 11:02 PM 06/27/2016  2:59 PM Full Code [id number]  Toy Baker, MD Inpatient   06/07/2016  6:04 PM 06/14/2016  7:40 PM Full Code [id number]  Shon Millet, DO Inpatient   05/11/2016  1:28 PM 05/12/2016  1:40 PM Full Code [id number]  Lorretta Harp, MD Inpatient   01/04/2015 12:39 PM 01/05/2015  2:53 PM Full Code [id number]  Lorretta Harp, MD Inpatient   06/05/2013  1:13 AM 06/05/2013  9:15 PM Full Code [id number]  Alwyn Pea, MD Inpatient  02/04/2013  1:11 AM 02/06/2013  6:43 PM Full Code [id number]  Rise Patience, MD Inpatient       Prognosis:   < 2 weeks  Discharge Planning:  Home with Hospice  Care plan was discussed with  Patient wife daughters   Thank you for allowing the Palliative Medicine Team to assist in the care of this patient.   Time In: 10 Time Out: 1025 Total Time 25 Prolonged Time Billed  no       Greater than 50%  of this time was spent counseling and coordinating care related to the above assessment and plan.  Loistine Chance,  MD [phone]  Please contact Palliative Medicine Team phone at [phone] for questions and concerns.

## 2016-06-28 NOTE — Progress Notes (Signed)
Verbal order from Rizwan to reorder IV Thorazine as pt was unable to tolerate PO. Medication was crushed as finely as possible and mixed with a dime size of applesauce.  About 30 seconds after administration, pt vomited medication back into emesis bag.   Wife and daughter pulled me out of room full of family to discuss being discharged to [facility].  Wife states that she talked to pt and said they came to the conclusion that the best way to keep him comfortable would be to continue taking the IV thorazine.  Wife states all three agree [facility] would be the best idea for discharge.  Rizwan paged with update on pt's/family's wishes.  Roselind Rily

## 2016-06-28 NOTE — Progress Notes (Signed)
RN spoke with Windell Moment (daughter) on the phone. Ms Theda Sers was just checking to see how her dad was doing. Nurse reported to Ms Theda Sers that the patient was resting comfortably. No changes from last night. Will continue to check patient

## 2016-06-28 NOTE — Progress Notes (Signed)
Nutrition Brief Note  Chart reviewed. Brief note written by this RD yesterday afternoon. Palliative Care team met with pt, his wife, and his daughter yesterday afternoon with associated note at 72. Per note, prognosis <2 weeks and pt prefers to d/c home for EOL rather than transfer to [facility]. Pt now transitioning to comfort care.  No further nutrition interventions warranted at this time.  Please re-consult as needed.     Jarome Matin, MS, RD, LDN Inpatient Clinical Dietitian Pager # [phone] After hours/weekend pager # [phone]

## 2016-06-28 NOTE — Progress Notes (Signed)
[facility] [place] RN note.   This is a GIP related and covered admission of 06/26/16, per Dr. Orpah Melter.  Patient has an [facility] diagnosis of gastric cancer.  Code status at this time is DNR.    Met with patient, granddaughter, Herbert Spires and daughter, Theophilus Kinds at bedside.  Patient denies pain.  He has not been vomiting this morning.  Per daughter, Theophilus Kinds, patient slept well last night.    Noted, patient has received IV Zofran X 3 today for nausea.   Patient is receiving continuous IV fluids through PIV.    Plan is for patient to discharge home with [facility], when ready. Daughter, Theophilus Kinds, states patient has all needed DME in the home.    [facility] will contiue to follow daily and assist as needed.  [name], RN, BSN [phone]

## 2016-06-28 NOTE — Progress Notes (Addendum)
PROGRESS NOTE    Tony Curtis  [id number] DOB: February 15, 1936 DOA: 06/26/2016  PCP: Pcp Not In System   Brief Narrative:  Tony Curtis a 80 y.o.malewith medical history significant of PVD, CAD status post coronary bypass in 2009 with subsequent occlusion of his vein grafts, Diabetes Mellitus Type 2, recently diagnosed fungating gastric invasive adenocarcinoma with metastasis to the liver according to Oncology not a candidate for chemotherapy or surgical intervention. Patient presented with an inability to keep fluids or food down because of significant dysphagia and has been spiting up blood with clots for 3 days. The plan was to be transferred to [facility] when bed was available but patient remains FULL Code at this point and family was not receptive to [place] given new diagnosis. Recently here with Sepsis due to Aspiration PNA and patient was discharged to Hospice. Patient admitted for Symptomatic Anemia and Dehydration from Intractable N/V.     Subjective: Cough and then vomited up a small amount of blood today.   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 s/p Transfusion of 2 units of pRBC's -Patient's Hb/Hct went from 6.9/23.1 -> 9.1/28.2 - d/c Aspirin  Dehydration from poor po Intake and vomiting- inablity tolerate from Fungating Invasive Gastric Adenocarcinoma -Patient has intractable N/V -C/w Zofran 4 mg po and IV q6hprn -Patient's Sodium increased from 147 -> 151 -Metoprolol 25 mg po Daily resumed due to Sinus Tachycardia- no improvement noted in HR -Changed Fluids to D5 1/2 NS at a rate of 75 mL-Sodium higher today at 152- change to D5 W - Thorazine IV has been helping- will switch to PO today   Hypokalemia - replacing  Fungating Invasive Gastric Adenocarinoma -Hematology/Oncology Notified- Not a Candidate for Chemotherapy or Surgical Intervention -Palliative Care  Medicine Consulted for Goals of Care and Symptom Management and appreciate Recc's and Management -Pain Control with Acetaminophen 650 mg po q6hprn, Hydrocodone-Acetaminophen 1-2 tablets po q4hprn.  -Hospice Nurse Evaluated and recommends against gastric Feeding tube and recommended that the focus be on management of pain -Patient prefers to go home with Hospice at D/C- will likely d/c tomorrow  Severe Protein-Calorie Malunutriton -C/w Ensure Enlive 237 mL  CAD with CABG x 3  -Cw ASA as patient had DES for PVD -Lipitor has been Held.   Hypertension -C/w Metoprolol Succinate 25 mg po Daily  DVT prophylaxis: SCDs Code Status: FULL Family Communication: Discussed with Wife and Family present at Bedside Disposition Plan: likely home tomorrow as long as his vomiting is controlled with Thorazine  Consultants:   Palliative Care Medicine Antimicrobials:  Anti-infectives    None       Objective: Vitals:   06/27/16 0826 06/27/16 1002 06/27/16 2123 06/28/16 0451  BP: 140/80 (!) 153/86 131/77 124/70  Pulse: (!) 102 (!) 103 (!) 110 (!) 111  Resp:  17 18 18   Temp:  98.3 F (36.8 C)  99.3 F (37.4 C)  TempSrc:  Oral Oral Oral  SpO2:  100% 100% 100%  Weight:      Height:        Intake/Output Summary (Last 24 hours) at 06/28/16 1506 Last data filed at 06/28/16 1500  Gross per 24 hour  Intake            697.5 ml  Output              300 ml  Net  397.5 ml   Filed Weights   06/26/16 1742 06/26/16 2300  Weight: 50.8 kg (112 lb) 46.6 kg (102 lb 11.8 oz)    Examination: General exam: frail cachectic male sitting up in bed - appears comfortable  HEENT: PERRLA, oral mucosa moist, no sclera icterus or thrush Respiratory system: Clear to auscultation. Respiratory effort normal. Cardiovascular system: S1 & S2 heard, RRR.  No murmurs  Gastrointestinal system: Abdomen soft, non-tender, nondistended. Normal bowel sound. No organomegaly Central nervous system: Alert and  oriented. No focal neurological deficits. Extremities: No cyanosis, clubbing or edema Skin: No rashes or ulcers Psychiatry:  Mood & affect appropriate.     Data Reviewed: I have personally reviewed following labs and imaging studies  CBC:  Recent Labs Lab 06/26/16 1803 06/27/16 0427 06/28/16 0350  WBC 13.6* 13.0* 11.1*  NEUTROABS 11.0*  --  8.6*  HGB 6.9* 9.1* 7.8*  HCT 23.1* 28.2* 24.4*  MCV 85.6 82.7 83.6  PLT 294 237 0000000   Basic Metabolic Panel:  Recent Labs Lab 06/26/16 1803 06/27/16 0427 06/28/16 0350  NA 147* 151* 152*  K 3.3* 3.5 3.1*  CL 108 113* 115*  CO2 27 28 29   GLUCOSE 136* 138* 163*  BUN 25* 27* 21*  CREATININE 0.69 0.71 0.76  CALCIUM 8.9 8.7* 8.6*  MG  --  2.2 2.1  PHOS  --  2.8 2.2*   GFR: Estimated Creatinine Clearance: 50.2 mL/min (by C-G formula based on SCr of 0.76 mg/dL). Liver Function Tests:  Recent Labs Lab 06/27/16 0427 06/28/16 0350  AST 39 38  ALT 13* 13*  ALKPHOS 269* 226*  BILITOT 1.5* 0.5  PROT 5.8* 5.3*  ALBUMIN 2.3* 2.1*   No results for input(s): LIPASE, AMYLASE in the last 168 hours. No results for input(s): AMMONIA in the last 168 hours. Coagulation Profile: No results for input(s): INR, PROTIME in the last 168 hours. Cardiac Enzymes: No results for input(s): CKTOTAL, CKMB, CKMBINDEX, TROPONINI in the last 168 hours. BNP (last 3 results) No results for input(s): PROBNP in the last 8760 hours. HbA1C: No results for input(s): HGBA1C in the last 72 hours. CBG:  Recent Labs Lab 06/26/16 1743 06/27/16 2123  GLUCAP 123* 138*   Lipid Profile: No results for input(s): CHOL, HDL, LDLCALC, TRIG, CHOLHDL, LDLDIRECT in the last 72 hours. Thyroid Function Tests:  Recent Labs  06/27/16 0427  TSH 2.957   Anemia Panel: No results for input(s): VITAMINB12, FOLATE, FERRITIN, TIBC, IRON, RETICCTPCT in the last 72 hours. Urine analysis:    Component Value Date/Time   COLORURINE AMBER (A) 06/07/2016 1517    APPEARANCEUR CLOUDY (A) 06/07/2016 1517   LABSPEC 1.030 06/07/2016 1517   PHURINE 6.0 06/07/2016 1517   GLUCOSEU NEGATIVE 06/07/2016 1517   HGBUR TRACE (A) 06/07/2016 1517   BILIRUBINUR MODERATE (A) 06/07/2016 1517   KETONESUR 15 (A) 06/07/2016 1517   PROTEINUR 30 (A) 06/07/2016 1517   UROBILINOGEN 0.2 06/11/2013 1448   NITRITE NEGATIVE 06/07/2016 1517   LEUKOCYTESUR NEGATIVE 06/07/2016 1517   Sepsis Labs: @LABRCNTIP (procalcitonin:4,lacticidven:4) )No results found for this or any previous visit (from the past 240 hour(s)).       Radiology Studies: Dg Chest Port 1 View  Result Date: 06/26/2016 CLINICAL DATA:  Hemoptysis. Severe dysphasia. History of prostate cancer. EXAM: PORTABLE CHEST 1 VIEW COMPARISON:  06/07/2016 FINDINGS: Previous median sternotomy and CABG procedure. Small bilateral pleural effusions are identified. The lungs are hyperinflated and there are coarsened interstitial markings of COPD. No superimposed airspace consolidation or pulmonary  edema. IMPRESSION: 1. Small bilateral pleural effusions. 2. COPD/emphysema Electronically Signed   By: Kerby Moors M.D.   On: 06/26/2016 18:23      Scheduled Meds: . acetaminophen  650 mg Oral Once  . aspirin EC  81 mg Oral Daily  . feeding supplement (ENSURE ENLIVE)  237 mL Oral BID BM  . fentaNYL  25 mcg Transdermal Q72H  . metoprolol succinate  25 mg Oral Daily  . ondansetron (ZOFRAN) IV  4 mg Intravenous Q6H  . sodium chloride flush  3 mL Intravenous Q12H   Continuous Infusions: . dextrose 5 % and 0.45% NaCl 75 mL/hr at 06/28/16 1455     LOS: 2 days    Time spent in minutes: [place], MD [facility] Pager: [email or web address] Password [email or web address] 06/28/2016, 3:06 PM

## 2016-06-29 DIAGNOSIS — R1310 Dysphagia, unspecified: Secondary | ICD-10-CM

## 2016-06-29 DIAGNOSIS — I25708 Atherosclerosis of coronary artery bypass graft(s), unspecified, with other forms of angina pectoris: Secondary | ICD-10-CM

## 2016-06-29 MED ORDER — MENTHOL 3 MG MT LOZG: 1.0000 | LOZENGE | OROMUCOSAL | 12 refills | Status: AC | PRN

## 2016-06-29 NOTE — Discharge Summary (Signed)
Physician Discharge Summary  LINC FLAMENCO [id number] DOB: 1936-06-30 DOA: 06/26/2016  PCP: Pcp Not In System  Admit date: 06/26/2016 Discharge date: 06/29/2016  Admitted From: home  Disposition:  [place] home   Please order IV Torazine for him at facility PRN Nausea/ Vomiting  Discharge Condition:  fair   CODE STATUS:  DNR   Diet recommendation:  Diet as tolerated Consultations:  Palliative care    Discharge Diagnoses:  Principal Problem:   Symptomatic anemia Active Problems:   Gastric adenocarcinoma ([place])   Dehydration   Esophageal dysphagia   Metastasis from gastric cancer ([place])   HTN (hypertension)   CAD, CABG X 3 9/09. Cath 2012 and 02/05/13- 2/3 grafts occluded- medical Rx   Protein-calorie malnutrition, severe, he is on chronic steroids   Coronary artery disease involving coronary bypass graft of native heart with unstable angina pectoris (HCC)    Subjective: Nausea currently controlled. No pain.   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 Patient presented with an inability to keep fluids or food down because of significant dysphagia and spiting up blood with clots for 3 days. The plan was to be transferred to [facility] when bed was available but patient remains FULL Code at this point and family was not receptive to [place] given new diagnosis. Recently here with Sepsis due to Aspiration PNA and patient was discharged home with Hospice. Patient admitted for Symptomatic Anemia and dehydration from intractable N/V.   Hospital Course:  Symptomatic Anemia and Chronic Blood Loss Anemia from Large Fungating Invasive Gastric  Adenocarcinoma s/p Transfusion of 2 units of PRBC's - occasionally coughing/ vomiting up small amounts of blood -Patient's Hb/Hct went from 6.9/23.1 -> 9.1/28.2 - d/c Aspirin  Dehydration from poor po Intake and vomiting- inablity tolerate from Fungating Invasive Gastric Adenocarcinoma -Patient has intractable N/V -C/w Zofran 4 mg po and IV q6hprn -Patient's Sodium increased from 147 -> 151 -Metoprolol 25 mg po Daily resumed due to Sinus Tachycardia- no improvement noted in HR -Changed Fluids to D5 1/2 NS at a rate of 75 mL-Sodium higher today at 152- change to D5 W - Thorazine IV has been helping- will switch to PO today   Hypokalemia - have been eplacing  Fungating Invasive Gastric Adenocarinoma -Hematology/Oncology Notified- Not a Candidate for Chemotherapy or Surgical Intervention -Palliative Care Medicine Consulted for Goals of Care and Symptom Management and appreciate Recc's and Management -Pain Control with Acetaminophen 650 mg po q6hprn, Hydrocodone-Acetaminophen 1-2 tablets po q4hprn.  -Hospice Nurse Evaluated and recommends against gastric Feeding tube - recommended that the focus be on management of pain -Patient prefers to go home with Hospice but due to ongoing vomiting which is mainly controlled with IV Thorazine, he will not be able to go home and will be transferred to [facility] residential hospice  Severe Protein-Calorie Malunutriton -unable to tolerate Ensure Enlive 237 mL  CAD with CABG x 3  -d/cASA and Lipitor   Hypertension  d/c Metoprolol Succinate 25 mg po Daily  Discharge Instructions  Discharge Instructions    Discharge instructions    Complete by:  As directed    Diet as tolerated   Increase activity slowly    Complete by:  As directed  Medication List    STOP taking these medications   amoxicillin-clavulanate 875-125 MG tablet Commonly known as:  AUGMENTIN   aspirin EC 81 MG tablet   cholecalciferol 1000 units  tablet Commonly known as:  VITAMIN D   metoprolol succinate 25 MG 24 hr tablet Commonly known as:  TOPROL XL   nitroGLYCERIN 0.4 MG SL tablet Commonly known as:  NITROSTAT   ondansetron 4 MG disintegrating tablet Commonly known as:  ZOFRAN-ODT   pantoprazole 40 MG tablet Commonly known as:  PROTONIX   polyethylene glycol powder powder Commonly known as:  GLYCOLAX/MIRALAX   pravastatin 40 MG tablet Commonly known as:  PRAVACHOL   prochlorperazine 10 MG tablet Commonly known as:  COMPAZINE   promethazine 25 MG suppository Commonly known as:  PHENERGAN   sorbitol 70 % solution     TAKE these medications   acetaminophen 325 MG tablet Commonly known as:  TYLENOL Take 2 tablets (650 mg total) by mouth every 4 (four) hours as needed for headache or mild pain.   baclofen 10 MG tablet Commonly known as:  LIORESAL Take 0.5 tablets (5 mg total) by mouth every 4 (four) hours as needed (hiccups).   fentaNYL 25 MCG/HR patch Commonly known as:  DURAGESIC - dosed mcg/hr Place 1 patch (25 mcg total) onto the skin every 3 (three) days.   isosorbide mononitrate 30 MG 24 hr tablet Commonly known as:  IMDUR Take 1 tablet (30 mg total) by mouth daily.   menthol-cetylpyridinium 3 MG lozenge Commonly known as:  CEPACOL Take 1 lozenge (3 mg total) by mouth as needed for sore throat.   SYSTANE 0.4-0.3 % Soln Generic drug:  Polyethyl Glycol-Propyl Glycol Place 1 drop into both eyes 4 (four) times daily as needed (dry eyes).      Follow-up Information    Hospice at [facility] Follow up.   Specialty:  Hospice and Palliative Medicine Why:  Home nurse Contact information: [place] [address] [phone]          No Known Allergies   Procedures/Studies:   Dg Chest 2 View  Result Date: 06/07/2016 CLINICAL DATA:  Increased weakness, chest pain increased after eating, difficulty swallowing since discharge from hospital following cardiac stent  procedure, hoarse voice, history hypertension, coronary artery disease post MI and stenting, prostate cancer EXAM: CHEST  2 VIEW COMPARISON:  04/04/2016 FINDINGS: Normal heart size post CABG. Mediastinal contours and pulmonary vascularity normal. Atherosclerotic calcification aorta. Lungs emphysematous but clear. No pleural effusion or pneumothorax. Bones unremarkable. IMPRESSION: Post CABG. COPD changes without infiltrate. Aortic atherosclerosis. Electronically Signed   By: Lavonia Dana M.D.   On: 06/07/2016 13:26   Dg Abd 1 View  Result Date: 06/12/2016 CLINICAL DATA:  Abdominal pain with nausea and vomiting for several weeks. Personal history of prostate carcinoma. EXAM: ABDOMEN - 1 VIEW COMPARISON:  06/11/2016 FINDINGS: There has been resolution of gaseous distention of small bowel and colon since previous study. Bowel gas pattern is within normal limits. Surgical clips again seen within pelvis and epigastric region. IMPRESSION: Normal bowel gas pattern.  No acute findings. Electronically Signed   By: Earle Gell M.D.   On: 06/12/2016 10:30   Dg Abd 1 View  Result Date: 06/11/2016 CLINICAL DATA:  Abdominal pain. No free air, portal venous gas, or pneumatosis. EXAM: ABDOMEN - 1 VIEW COMPARISON:  CT scan from yesterday FINDINGS: Air-filled loops of large and small bowel with gas extending into the rectum may represent developing ileus. Contrast in the  bladder is consistent with recent contrast-enhanced CT scan. No other significant abnormalities. IMPRESSION: Suggested developing ileus. Electronically Signed   By: Dorise Bullion III M.D   On: 06/11/2016 16:25   Ct Chest W Contrast  Result Date: 06/10/2016 CLINICAL DATA:  Malignant appearing gastric mass on upper endoscopy, dysphasia, weight loss, iron deficiency anemia EXAM: CT CHEST AND ABDOMEN WITH CONTRAST TECHNIQUE: Multidetector CT imaging of the chest and abdomen was performed following the standard protocol during bolus administration of  intravenous contrast. CONTRAST:  100 mL Isovue 300 IV COMPARISON:  CTA abdomen/ pelvis dated 08/28/2014 FINDINGS: CT CHEST FINDINGS Cardiovascular: Heart is top-normal in size. No pericardial effusion. Coronary atherosclerosis in the LAD. Postsurgical changes related to prior CABG. Mild atherosclerotic calcifications of the aortic arch. Mediastinum/Nodes: Small mediastinal lymph nodes, including a 7 mm short axis low right paratracheal node (series 2/ image 23) and an 8 mm short axis AP window node (series 2/ image 22), within the upper limits of normal. 9 mm short axis lower paraesophageal node (series 2/image 47), suspicious. Visualized thyroid is unremarkable. Fluid within the mid/distal esophagus. Lungs/Pleura: Patchy opacities in the lingula and bilateral lower lobes, left lower lobe predominant, suspicious for pneumonia. Superimposed atelectasis is possible. Associated 1.9 cm cavitary lesion in the right lower lobe (series 2/ image 41), likely reflecting an associated pulmonary abscess. Small bilateral pleural effusions. No suspicious pulmonary nodules. Underlying mild centrilobular and paraseptal emphysematous changes. No pneumothorax. Musculoskeletal: Stable bone island in the anterior T8 vertebral body (sagittal image 67). Median sternotomy. CT ABDOMEN FINDINGS Hepatobiliary: Multifocal hepatic metastases, at least 8 in number, most of which are in the right hepatic lobe. Dominant 3.1 cm metastasis in segment 7 (series 2/ image 48). A 2.0 cm metastasis is present in segment 2 (series 2/image 58). Gallbladder is unremarkable. No intrahepatic or extrahepatic ductal dilatation. Pancreas: Within normal limits. Spleen: Within normal limits. Adrenals/Urinary Tract: Adrenal glands are within normal limits. 11 mm left upper pole renal cyst (series 2/ image 65). 6 mm right lower pole renal cyst (series 2/ image 76). No hydronephrosis. Stomach/Bowel: Wall thickening/ mass in the gastric cardia (series 2/ image 51),  likely corresponding to the endoscopic lesion. Visualized bowel is unremarkable. Vascular/Lymphatic: No evidence abdominal aortic aneurysm. 10 mm short axis left para-aortic node (series 2/ image 68), suspicious. Other: No abdominal ascites. Musculoskeletal: Visualized lumbar spine is unremarkable. IMPRESSION: Wall thickening/ mass in the gastric cardia, corresponding to the suspected gastric mass on endoscopy. Multifocal hepatic metastases in both lobes, measuring up to 3.1 cm in segment 7. Suspected lower esophageal and left para-aortic nodal metastases. Multifocal patchy opacities in the lingula and bilateral lower lobes, suspicious for pneumonia. Suspected 1.9 cm pulmonary abscess in the right lower lobe. Small bilateral pleural effusions. Please note that the pelvis was not imaged. Electronically Signed   By: Julian Hy M.D.   On: 06/10/2016 13:41   Ct Abdomen W Contrast  Result Date: 06/10/2016 CLINICAL DATA:  Malignant appearing gastric mass on upper endoscopy, dysphasia, weight loss, iron deficiency anemia EXAM: CT CHEST AND ABDOMEN WITH CONTRAST TECHNIQUE: Multidetector CT imaging of the chest and abdomen was performed following the standard protocol during bolus administration of intravenous contrast. CONTRAST:  100 mL Isovue 300 IV COMPARISON:  CTA abdomen/ pelvis dated 08/28/2014 FINDINGS: CT CHEST FINDINGS Cardiovascular: Heart is top-normal in size. No pericardial effusion. Coronary atherosclerosis in the LAD. Postsurgical changes related to prior CABG. Mild atherosclerotic calcifications of the aortic arch. Mediastinum/Nodes: Small mediastinal lymph nodes, including a 7  mm short axis low right paratracheal node (series 2/ image 23) and an 8 mm short axis AP window node (series 2/ image 22), within the upper limits of normal. 9 mm short axis lower paraesophageal node (series 2/image 47), suspicious. Visualized thyroid is unremarkable. Fluid within the mid/distal esophagus. Lungs/Pleura:  Patchy opacities in the lingula and bilateral lower lobes, left lower lobe predominant, suspicious for pneumonia. Superimposed atelectasis is possible. Associated 1.9 cm cavitary lesion in the right lower lobe (series 2/ image 41), likely reflecting an associated pulmonary abscess. Small bilateral pleural effusions. No suspicious pulmonary nodules. Underlying mild centrilobular and paraseptal emphysematous changes. No pneumothorax. Musculoskeletal: Stable bone island in the anterior T8 vertebral body (sagittal image 67). Median sternotomy. CT ABDOMEN FINDINGS Hepatobiliary: Multifocal hepatic metastases, at least 8 in number, most of which are in the right hepatic lobe. Dominant 3.1 cm metastasis in segment 7 (series 2/ image 48). A 2.0 cm metastasis is present in segment 2 (series 2/image 58). Gallbladder is unremarkable. No intrahepatic or extrahepatic ductal dilatation. Pancreas: Within normal limits. Spleen: Within normal limits. Adrenals/Urinary Tract: Adrenal glands are within normal limits. 11 mm left upper pole renal cyst (series 2/ image 65). 6 mm right lower pole renal cyst (series 2/ image 76). No hydronephrosis. Stomach/Bowel: Wall thickening/ mass in the gastric cardia (series 2/ image 51), likely corresponding to the endoscopic lesion. Visualized bowel is unremarkable. Vascular/Lymphatic: No evidence abdominal aortic aneurysm. 10 mm short axis left para-aortic node (series 2/ image 68), suspicious. Other: No abdominal ascites. Musculoskeletal: Visualized lumbar spine is unremarkable. IMPRESSION: Wall thickening/ mass in the gastric cardia, corresponding to the suspected gastric mass on endoscopy. Multifocal hepatic metastases in both lobes, measuring up to 3.1 cm in segment 7. Suspected lower esophageal and left para-aortic nodal metastases. Multifocal patchy opacities in the lingula and bilateral lower lobes, suspicious for pneumonia. Suspected 1.9 cm pulmonary abscess in the right lower lobe. Small  bilateral pleural effusions. Please note that the pelvis was not imaged. Electronically Signed   By: Julian Hy M.D.   On: 06/10/2016 13:41   Dg Chest Port 1 View  Result Date: 06/26/2016 CLINICAL DATA:  Hemoptysis. Severe dysphasia. History of prostate cancer. EXAM: PORTABLE CHEST 1 VIEW COMPARISON:  06/07/2016 FINDINGS: Previous median sternotomy and CABG procedure. Small bilateral pleural effusions are identified. The lungs are hyperinflated and there are coarsened interstitial markings of COPD. No superimposed airspace consolidation or pulmonary edema. IMPRESSION: 1. Small bilateral pleural effusions. 2. COPD/emphysema Electronically Signed   By: Kerby Moors M.D.   On: 06/26/2016 18:23       Discharge Exam: Vitals:   06/28/16 2134 06/29/16 0552  BP: 123/78 117/69  Pulse: (!) 120 (!) 119  Resp: 18 18  Temp: (!) 100.4 F (38 C) 99.8 F (37.7 C)   Vitals:   06/28/16 0451 06/28/16 1500 06/28/16 2134 06/29/16 0552  BP: 124/70 129/70 123/78 117/69  Pulse: (!) 111 (!) 109 (!) 120 (!) 119  Resp: 18 18 18 18   Temp: 99.3 F (37.4 C) 98.8 F (37.1 C) (!) 100.4 F (38 C) 99.8 F (37.7 C)  TempSrc: Oral Oral Oral Oral  SpO2: 100% 98% 94% 98%  Weight:      Height:        General: Pt is alert, awake, not in acute distress Cardiovascular: RRR, S1/S2 +, no rubs, no gallops Respiratory: CTA bilaterally, no wheezing, no rhonchi Abdominal: Soft, NT, ND, bowel sounds + Extremities: no edema, no cyanosis    The results  of significant diagnostics from this hospitalization (including imaging, microbiology, ancillary and laboratory) are listed below for reference.     Microbiology: No results found for this or any previous visit (from the past 240 hour(s)).   Labs: BNP (last 3 results) No results for input(s): BNP in the last 8760 hours. Basic Metabolic Panel:  Recent Labs Lab 06/26/16 1803 06/27/16 0427 06/28/16 0350  NA 147* 151* 152*  K 3.3* 3.5 3.1*  CL 108 113*  115*  CO2 27 28 29   GLUCOSE 136* 138* 163*  BUN 25* 27* 21*  CREATININE 0.69 0.71 0.76  CALCIUM 8.9 8.7* 8.6*  MG  --  2.2 2.1  PHOS  --  2.8 2.2*   Liver Function Tests:  Recent Labs Lab 06/27/16 0427 06/28/16 0350  AST 39 38  ALT 13* 13*  ALKPHOS 269* 226*  BILITOT 1.5* 0.5  PROT 5.8* 5.3*  ALBUMIN 2.3* 2.1*   No results for input(s): LIPASE, AMYLASE in the last 168 hours. No results for input(s): AMMONIA in the last 168 hours. CBC:  Recent Labs Lab 06/26/16 1803 06/27/16 0427 06/28/16 0350  WBC 13.6* 13.0* 11.1*  NEUTROABS 11.0*  --  8.6*  HGB 6.9* 9.1* 7.8*  HCT 23.1* 28.2* 24.4*  MCV 85.6 82.7 83.6  PLT 294 237 175   Cardiac Enzymes: No results for input(s): CKTOTAL, CKMB, CKMBINDEX, TROPONINI in the last 168 hours. BNP: Invalid input(s): POCBNP CBG:  Recent Labs Lab 06/26/16 1743 06/27/16 2123  GLUCAP 123* 138*   D-Dimer No results for input(s): DDIMER in the last 72 hours. Hgb A1c No results for input(s): HGBA1C in the last 72 hours. Lipid Profile No results for input(s): CHOL, HDL, LDLCALC, TRIG, CHOLHDL, LDLDIRECT in the last 72 hours. Thyroid function studies  Recent Labs  06/27/16 0427  TSH 2.957   Anemia work up No results for input(s): VITAMINB12, FOLATE, FERRITIN, TIBC, IRON, RETICCTPCT in the last 72 hours. Urinalysis    Component Value Date/Time   COLORURINE AMBER (A) 06/07/2016 1517   APPEARANCEUR CLOUDY (A) 06/07/2016 1517   LABSPEC 1.030 06/07/2016 1517   PHURINE 6.0 06/07/2016 1517   GLUCOSEU NEGATIVE 06/07/2016 1517   HGBUR TRACE (A) 06/07/2016 1517   BILIRUBINUR MODERATE (A) 06/07/2016 1517   KETONESUR 15 (A) 06/07/2016 1517   PROTEINUR 30 (A) 06/07/2016 1517   UROBILINOGEN 0.2 06/11/2013 1448   NITRITE NEGATIVE 06/07/2016 1517   LEUKOCYTESUR NEGATIVE 06/07/2016 1517   Sepsis Labs Invalid input(s): PROCALCITONIN,  WBC,  LACTICIDVEN Microbiology No results found for this or any previous visit (from the past 240  hour(s)).   Time coordinating discharge: Over 30 minutes  SIGNED:   Debbe Odea, MD  [facility] 06/29/2016, 11:48 AM Pager   If 7PM-7AM, please contact night-coverage [email or web address] Password [email or web address]

## 2016-06-29 NOTE — Progress Notes (Signed)
[facility] [place] Work note LCSW met with daughter and patient (briefly). He was quite lethargic and vomiting up blood, sputum. LCSW offered bed at [facility] and patient/family agreed. LCSW offered education about facility and end of life behaviors. LCSW offered support to patient, daughter, granddaughter. LCSW notified liaison RN, Abigail Butts and floor RN of plan. Katherina Right, [place]

## 2016-06-29 NOTE — Progress Notes (Signed)
[facility] RN note.  This is a GIP related and covered admission of 06/26/16, per Dr. Orpah Melter. Patient has an [facility] diagnosis of gastric cancer. Code status at this time is DNR.    Visited patient at the bedside.  There are no family members/visitors present.  There is emesis in patient's emesis bag, and he has the hiccups.  Patient denies pain.  He does not engage this Probation officer in conversation.    Noted, plan is for [facility] placement, when bed available.    Noted, patient received Thorazine 50 mg po last evening for nausea.   [facility] will continue to follow, and assist as needed.    [name], RN, BSN [phone]

## 2016-06-29 NOTE — Progress Notes (Signed)
CSW reviewed Dr. Inda Castle note indicating that patient & family are now agreeable with residential hospice placement. CSW made [facility] RN, [place] aware. CSW will await call back from [facility] once [facility] has a bed & will inform Dr. Wynelle Cleveland to do discharge summary & facilitate discharge when ready.    Raynaldo Opitz, [facility] Clinical Social Worker cell #: [phone]

## 2016-06-29 NOTE — Progress Notes (Signed)
Pt to be discharged to [facility] this afternoon. Report called to facility and Caryl Asp RN accepting report for this facility.

## 2016-06-29 NOTE — Progress Notes (Signed)
Daily Progress Note   Patient Name: Tony Curtis       Date: 06/29/2016 DOB: 1936-05-19  Age: 80 y.o. MRN#: [id number] Attending Physician: Debbe Odea, MD Primary Care Physician: Pcp Not In System Admit Date: 06/26/2016  Reason for Consultation/Follow-up: Establishing goals of care  Subjective:  Patient is awake/alert resting in bed. He has ongoing nausea and hiccups, not able to take PO medications.  Continue IV Thorazine as this is helping him with nausea and hiccups.  See below:   Length of Stay: 3  Current Medications: Scheduled Meds:  . acetaminophen  650 mg Oral Once  . feeding supplement (ENSURE ENLIVE)  237 mL Oral BID BM  . fentaNYL  25 mcg Transdermal Q72H  . metoprolol succinate  25 mg Oral Daily  . ondansetron (ZOFRAN) IV  4 mg Intravenous Q6H  . sodium chloride flush  3 mL Intravenous Q12H    Continuous Infusions: . dextrose 5 % and 0.45% NaCl 75 mL/hr at 06/29/16 0436    PRN Meds: acetaminophen **OR** acetaminophen, chlorproMAZINE (THORAZINE) IV, chlorproMAZINE, menthol-cetylpyridinium, ondansetron **OR** ondansetron (ZOFRAN) IV, phenol  Physical Exam         Weak frail S1 S2 Clear diminished Abdomen soft No edema Awake  Vital Signs: BP 117/69 (BP Location: Right Arm)   Pulse (!) 119   Temp 99.8 F (37.7 C) (Oral)   Resp 18   Ht 5\' 10"  (1.778 m)   Wt 46.6 kg (102 lb 11.8 oz)   SpO2 98%   BMI 14.74 kg/m  SpO2: SpO2: 98 % O2 Device: O2 Device: Nasal Cannula O2 Flow Rate: O2 Flow Rate (L/min): 2 L/min  Intake/output summary:   Intake/Output Summary (Last 24 hours) at 06/29/16 0850 Last data filed at 06/29/16 0600  Gross per 24 hour  Intake           1847.5 ml  Output              400 ml  Net           1447.5 ml   LBM: Last BM Date:  06/27/16 Baseline Weight: Weight: 50.8 kg (112 lb) Most recent weight: Weight: 46.6 kg (102 lb 11.8 oz)       Palliative Assessment/Data:    Flowsheet Rows   Flowsheet Row Most Recent Value  Intake  Tab  Referral Department  Hospitalist  Unit at Time of Referral  Oncology Unit  Palliative Care Primary Diagnosis  Cancer  Date Notified  06/26/16  Palliative Care Type  Return patient Palliative Care  Reason for referral  Clarify Goals of Care, Non-pain Symptom  Date of Admission  06/26/16  # of days IP prior to Palliative referral  0  Clinical Assessment  Palliative Performance Scale Score  30%  Pain Max last 24 hours  5  Pain Min Last 24 hours  4  Dyspnea Max Last 24 Hours  5  Dyspnea Min Last 24 hours  4  Nausea Max Last 24 Hours  7  Nausea Min Last 24 Hours  4  Anxiety Max Last 24 Hours  6  Anxiety Min Last 24 Hours  5  Psychosocial & Spiritual Assessment  Palliative Care Outcomes  Patient/Family meeting held?  Yes  Who was at the meeting?  patient wife daughters   Palliative Care Outcomes  Clarified goals of care  Palliative Care follow-up planned  Yes, Home  Palliative Care Follow-up Reason  Non-pain symptom      Patient Active Problem List   Diagnosis Date Noted  . 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 Failure to thrive in adult   . Gastric adenocarcinoma ([place])   . Anemia 06/10/2016  . Malignancy ([place])   . Symptomatic anemia 06/07/2016  . Dehydration 06/07/2016  . Esophageal dysphagia 06/07/2016  . Odynophagia 06/07/2016  . Claudication ([place]) 01/04/2015  . Coronary artery disease involving coronary bypass graft of native heart with unstable angina pectoris ([place])   . Protein-calorie malnutrition, severe, he is on chronic steroids 02/05/2013  . HTN (hypertension)   . Hyperlipemia   . CAD, CABG X 3 9/09. Cath 2012 and 02/05/13- 2/3 grafts occluded- medical Rx   . Prostate cancer ([place])   . PVD - Rt SFA PTA 8/112     Palliative Care Assessment & Plan    Patient Profile:    Assessment:  gastric cancer Nausea Vomiting Abdominal pain Hiccups   Recommendations/Plan:  Patient and family now agreeable to residential hospice, agree this is most appropriate  IV Thorazine   for nausea and hiccups after discharge.   Continue current mode of treatment.    Code Status:    Code Status Orders        Start     Ordered   06/27/16 1459  Do not attempt resuscitation (DNR)  Continuous    Question Answer Comment  In the event of cardiac or respiratory ARREST Do not call a "code blue"   In the event of cardiac or respiratory ARREST Do not perform Intubation, CPR, defibrillation or ACLS   In the event of cardiac or respiratory ARREST Use medication by any route, position, wound care, and other measures to relive pain and suffering. May use oxygen, suction and manual treatment of airway obstruction as needed for comfort.      06/27/16 1459    Code Status History    Date Active Date Inactive Code Status Order ID Comments User Context   06/26/2016 11:02 PM 06/27/2016  2:59 PM Full Code [id number]  Toy Baker, MD Inpatient   06/07/2016  6:04 PM 06/14/2016  7:40 PM Full Code [id number]  Shon Millet, DO Inpatient   05/11/2016  1:28 PM 05/12/2016  1:40 PM Full Code [id number]  Lorretta Harp, MD Inpatient   01/04/2015 12:39 PM 01/05/2015  2:53 PM Full Code [id number]  Lorretta Harp,  MD Inpatient   06/05/2013  1:13 AM 06/05/2013  9:15 PM Full Code [id number]  Alwyn Pea, MD Inpatient   02/04/2013  1:11 AM 02/06/2013  6:43 PM Full Code [id number]  Rise Patience, MD Inpatient       Prognosis:   < 2 weeks  Discharge Planning:    Hospice home in [facility] place.   Care plan was discussed with  Patient wife and RN   Thank you for allowing the Palliative Medicine Team to assist in the care of this patient.   Time In: 8 Time Out: 8.35 Total Time 25 Prolonged Time Billed  no       Greater than 50%  of  this time was spent counseling and coordinating care related to the above assessment and plan.  Loistine Chance, MD [phone]  Please contact Palliative Medicine Team phone at [phone] for questions and concerns.

## 2016-07-05 NOTE — Progress Notes (Signed)
Hand delivered signed certificate of death to HIM today.

## 2016-07-14 DEATH — deceased

## 2016-11-21 ENCOUNTER — Encounter (HOSPITAL_COMMUNITY): Payer: Medicare Other

## 2017-05-03 IMAGING — CR DG CHEST 2V
2 series · 2 of 2 positions shown · non-contrast
Comparison: None.

CLINICAL DATA: Shortness of breath.  Cough.

EXAM:
CHEST  2 VIEW

[w chest pa]
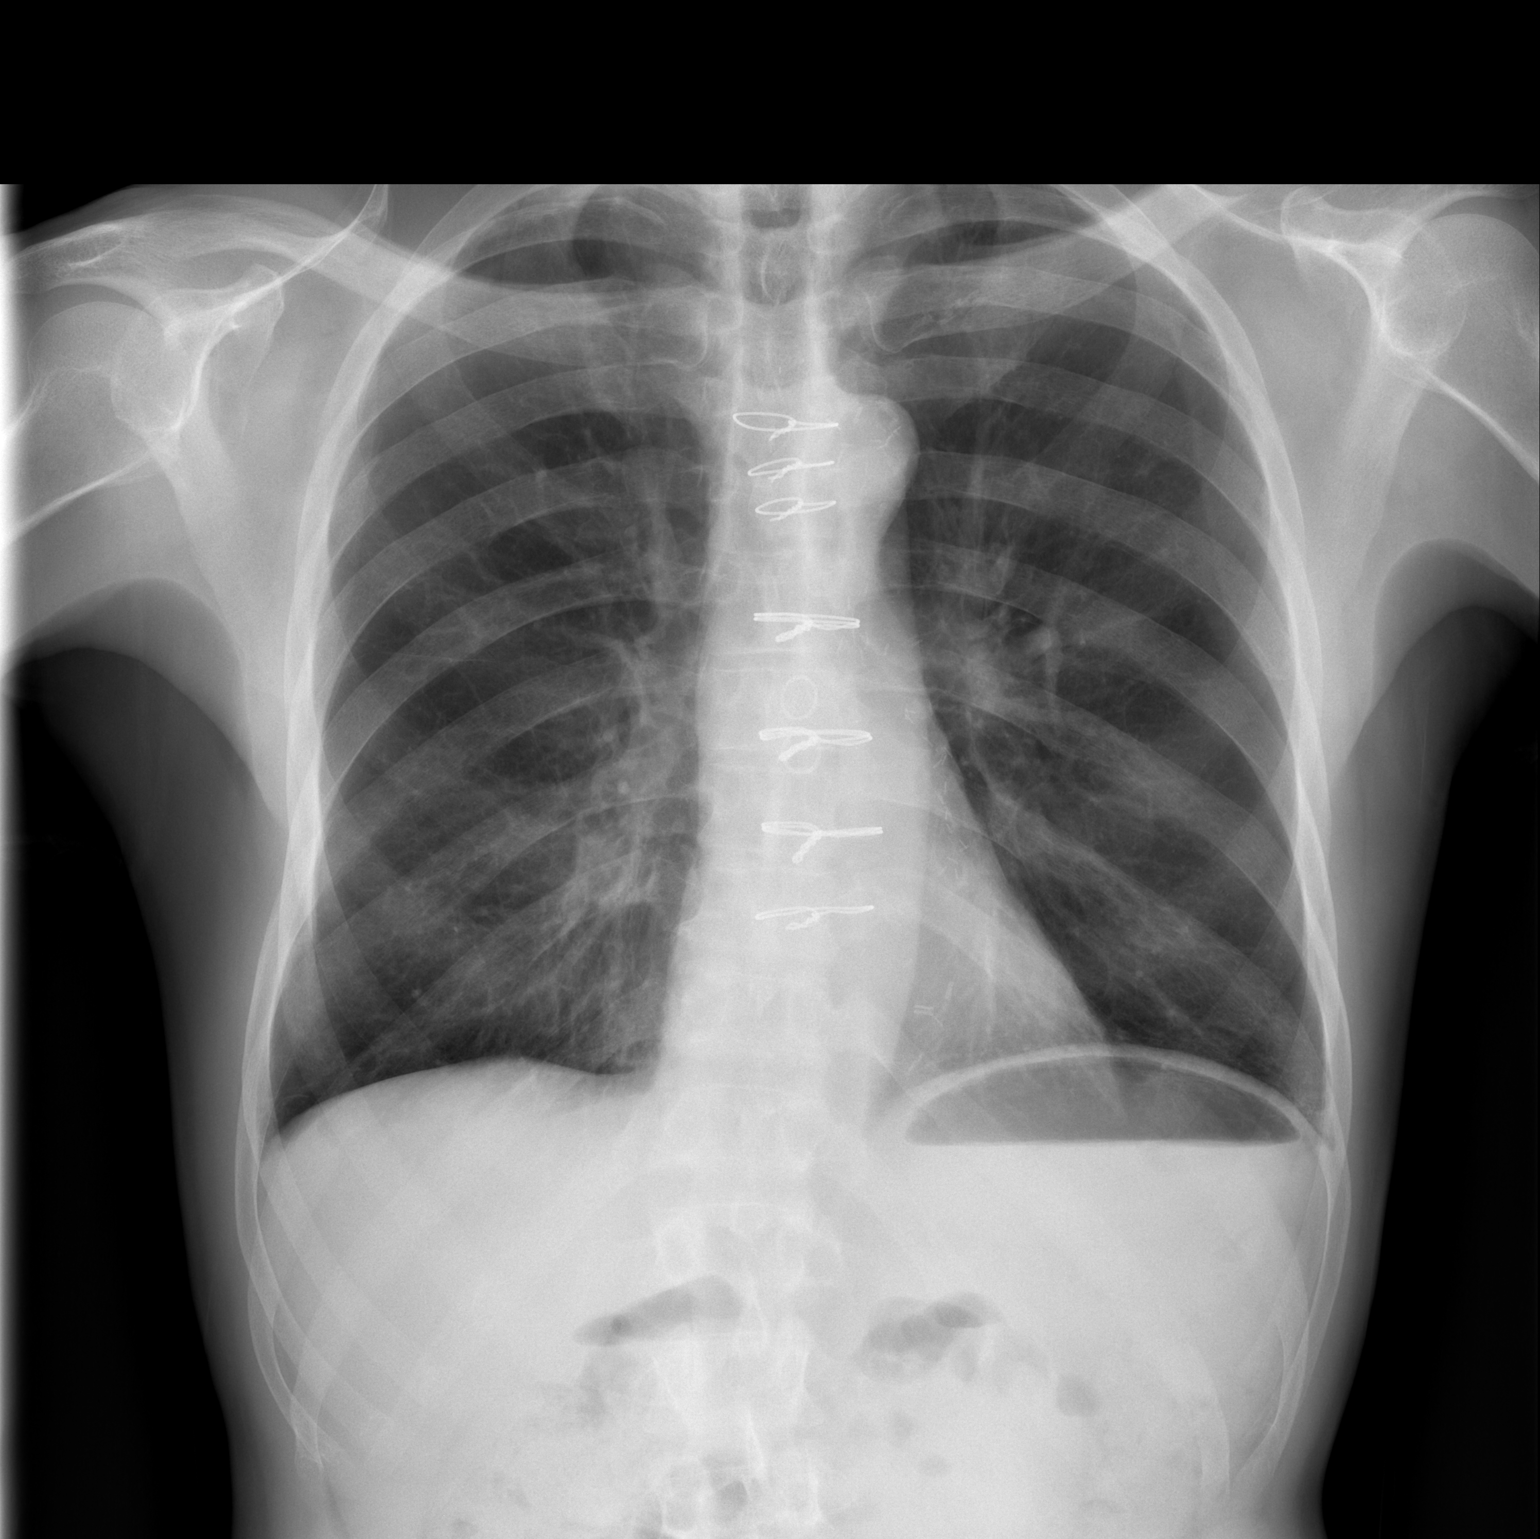

[w chest lat]
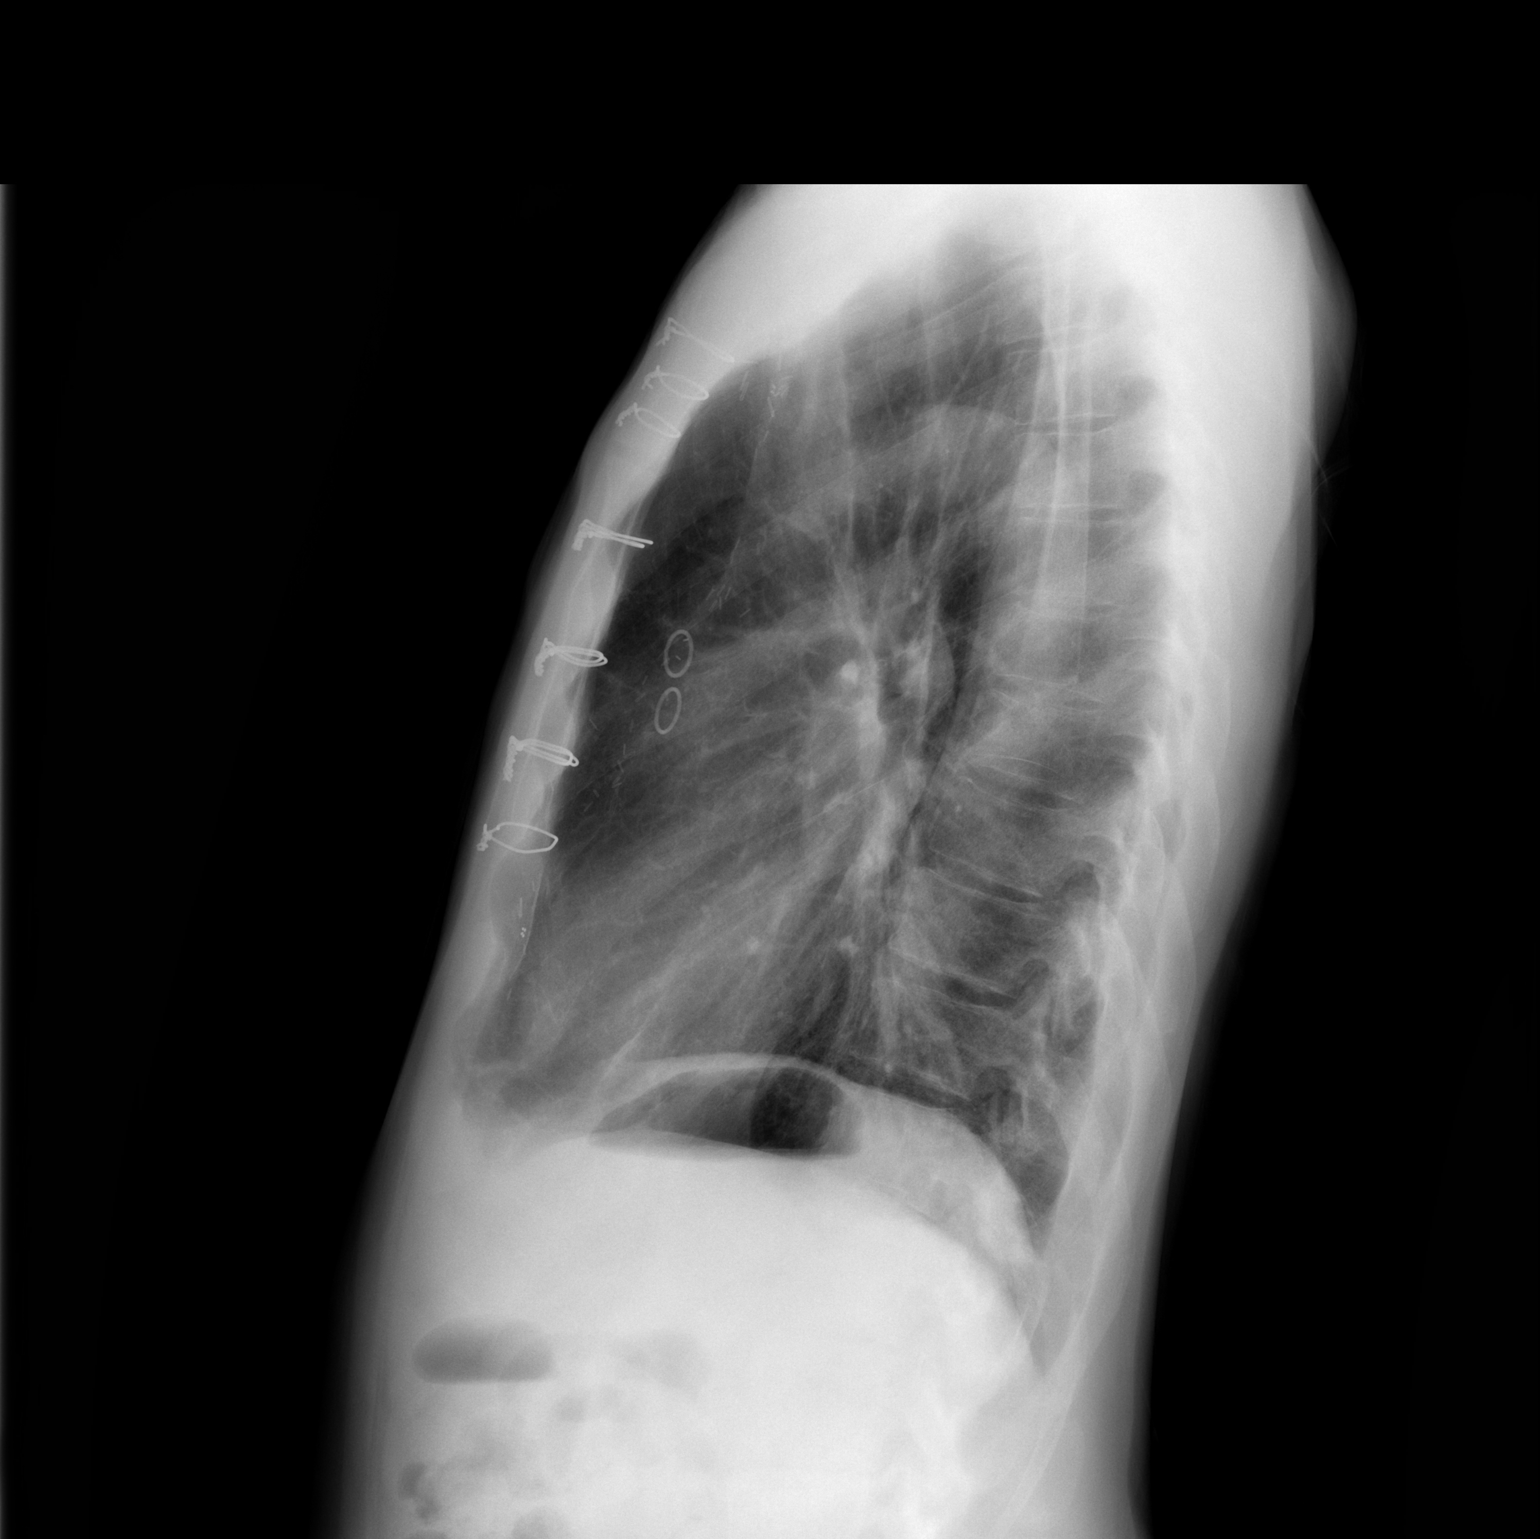

[2 of 2 positions shown; findings below may reference images not displayed]

FINDINGS: Mediastinum and hilar structures are normal. Lungs are clear of
acute infiltrate. Apical pleural thickening noted consistent with
scarring. Prior CABG. Heart size normal. No pleural effusion or
pneumothorax. No acute bony abnormality.
IMPRESSION: No acute cardiopulmonary disease.  Prior CABG.

## 2017-08-01 IMAGING — DX DG ABDOMEN 1V
1 series · 1 of 1 positions shown · non-contrast
Comparison: 06/11/2016

CLINICAL DATA: Abdominal pain with nausea and vomiting for several
weeks. Personal history of prostate carcinoma.

EXAM:
ABDOMEN - 1 VIEW

[t abdomen supine]
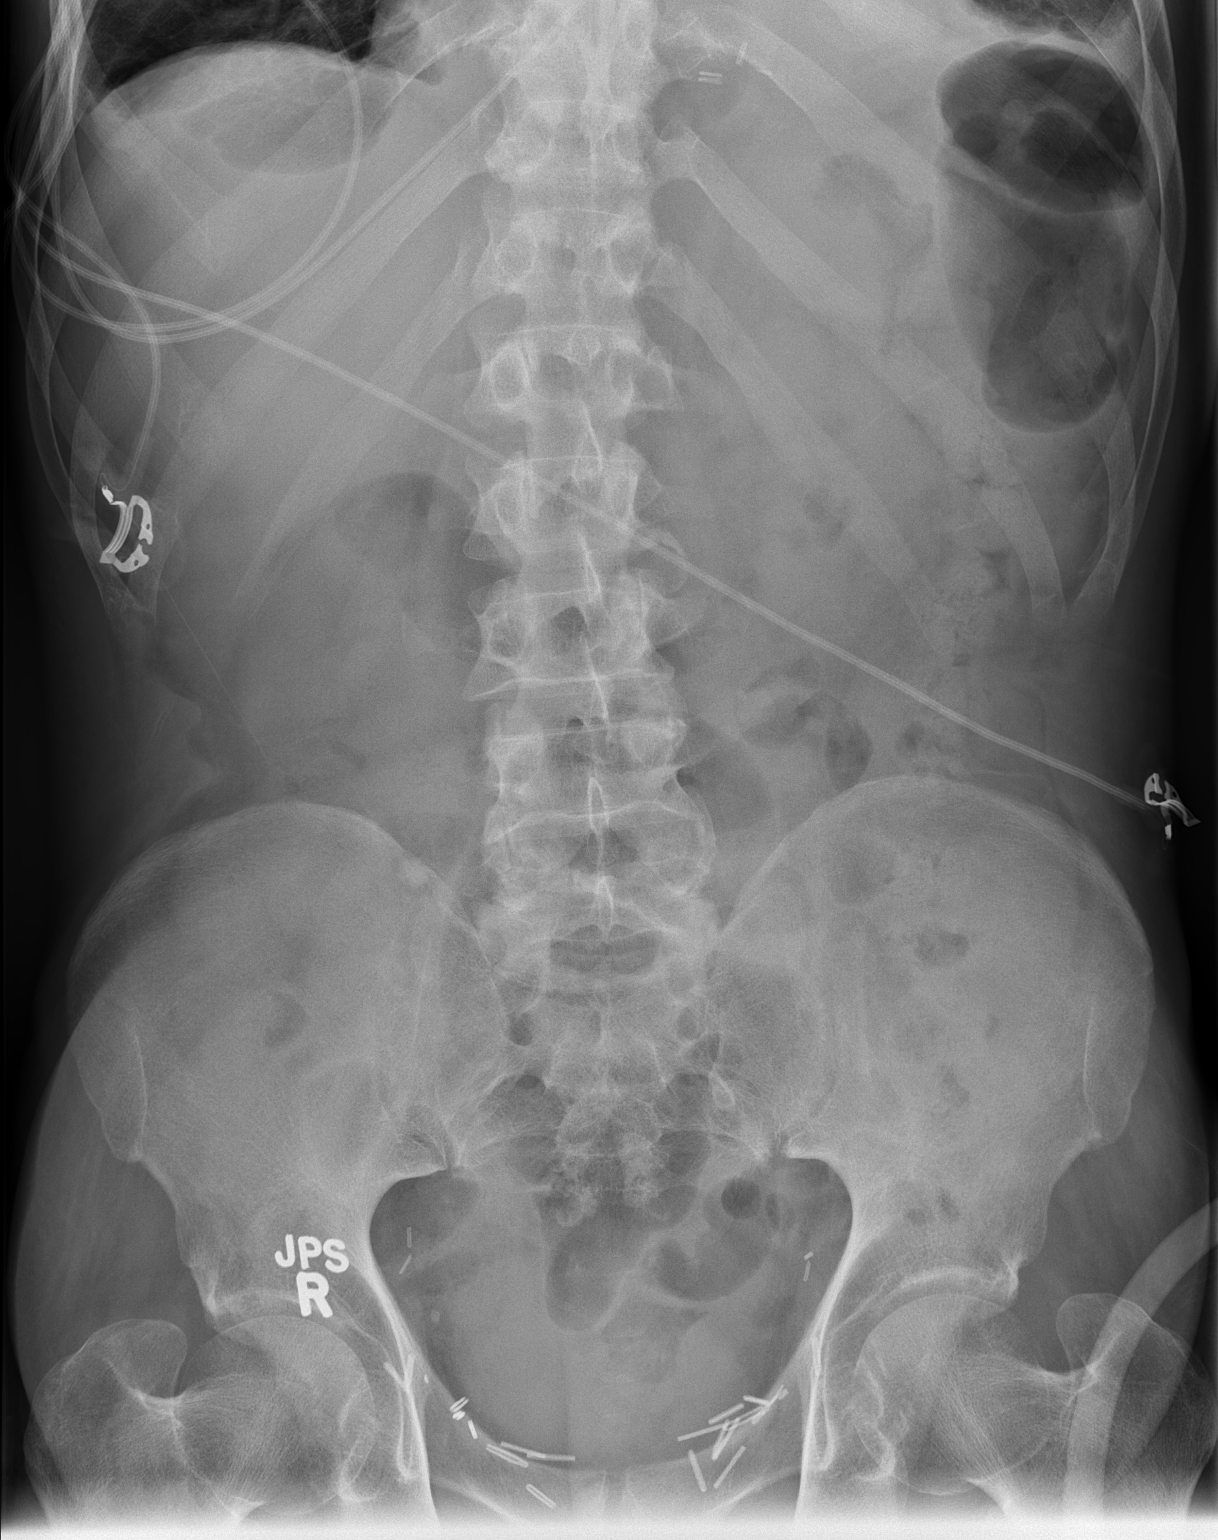

[1 of 1 positions shown; findings below may reference images not displayed]

FINDINGS: There has been resolution of gaseous distention of small bowel and
colon since previous study. Bowel gas pattern is within normal
limits. Surgical clips again seen within pelvis and epigastric
region.
IMPRESSION: Normal bowel gas pattern.  No acute findings.

## 2017-12-12 DEATH — deceased
